# Patient Record
Sex: Female | Born: 1945
Health system: Southern US, Community
[De-identification: ages and names within clinical notes are randomized; demographics above are authoritative.]

## PROBLEM LIST (undated history)

## (undated) DIAGNOSIS — R7301 Impaired fasting glucose: Secondary | ICD-10-CM

## (undated) DIAGNOSIS — K219 Gastro-esophageal reflux disease without esophagitis: Secondary | ICD-10-CM

## (undated) DIAGNOSIS — J342 Deviated nasal septum: Secondary | ICD-10-CM

## (undated) DIAGNOSIS — H269 Unspecified cataract: Secondary | ICD-10-CM

## (undated) DIAGNOSIS — N6019 Diffuse cystic mastopathy of unspecified breast: Secondary | ICD-10-CM

## (undated) DIAGNOSIS — D649 Anemia, unspecified: Secondary | ICD-10-CM

## (undated) DIAGNOSIS — J309 Allergic rhinitis, unspecified: Secondary | ICD-10-CM

## (undated) DIAGNOSIS — T884XXA Failed or difficult intubation, initial encounter: Secondary | ICD-10-CM

## (undated) DIAGNOSIS — IMO0001 Reserved for inherently not codable concepts without codable children: Secondary | ICD-10-CM

## (undated) DIAGNOSIS — T4145XA Adverse effect of unspecified anesthetic, initial encounter: Secondary | ICD-10-CM

## (undated) DIAGNOSIS — E559 Vitamin D deficiency, unspecified: Secondary | ICD-10-CM

## (undated) DIAGNOSIS — T8859XA Other complications of anesthesia, initial encounter: Secondary | ICD-10-CM

## (undated) DIAGNOSIS — M19042 Primary osteoarthritis, left hand: Secondary | ICD-10-CM

## (undated) DIAGNOSIS — M2040 Other hammer toe(s) (acquired), unspecified foot: Secondary | ICD-10-CM

## (undated) DIAGNOSIS — R030 Elevated blood-pressure reading, without diagnosis of hypertension: Secondary | ICD-10-CM

## (undated) DIAGNOSIS — M19041 Primary osteoarthritis, right hand: Secondary | ICD-10-CM

## (undated) DIAGNOSIS — J3089 Other allergic rhinitis: Secondary | ICD-10-CM

## (undated) DIAGNOSIS — N83202 Unspecified ovarian cyst, left side: Secondary | ICD-10-CM

## (undated) DIAGNOSIS — M1990 Unspecified osteoarthritis, unspecified site: Secondary | ICD-10-CM

## (undated) DIAGNOSIS — E785 Hyperlipidemia, unspecified: Secondary | ICD-10-CM

## (undated) DIAGNOSIS — L719 Rosacea, unspecified: Secondary | ICD-10-CM

## (undated) DIAGNOSIS — T7840XA Allergy, unspecified, initial encounter: Secondary | ICD-10-CM

## (undated) DIAGNOSIS — M810 Age-related osteoporosis without current pathological fracture: Secondary | ICD-10-CM

## (undated) DIAGNOSIS — B029 Zoster without complications: Secondary | ICD-10-CM

## (undated) DIAGNOSIS — Z8701 Personal history of pneumonia (recurrent): Secondary | ICD-10-CM

## (undated) DIAGNOSIS — M21619 Bunion of unspecified foot: Secondary | ICD-10-CM

## (undated) DIAGNOSIS — M47812 Spondylosis without myelopathy or radiculopathy, cervical region: Secondary | ICD-10-CM

## (undated) DIAGNOSIS — G4730 Sleep apnea, unspecified: Secondary | ICD-10-CM

## (undated) DIAGNOSIS — L718 Other rosacea: Secondary | ICD-10-CM

## (undated) DIAGNOSIS — E079 Disorder of thyroid, unspecified: Secondary | ICD-10-CM

## (undated) DIAGNOSIS — G4733 Obstructive sleep apnea (adult) (pediatric): Secondary | ICD-10-CM

## (undated) DIAGNOSIS — Z8781 Personal history of (healed) traumatic fracture: Secondary | ICD-10-CM

## (undated) DIAGNOSIS — I10 Essential (primary) hypertension: Secondary | ICD-10-CM

## (undated) DIAGNOSIS — E041 Nontoxic single thyroid nodule: Secondary | ICD-10-CM

## (undated) DIAGNOSIS — N83201 Unspecified ovarian cyst, right side: Secondary | ICD-10-CM

## (undated) HISTORY — PX: SPINE SURGERY: SHX786

## (undated) HISTORY — DX: Other rosacea: L71.8

## (undated) HISTORY — DX: Diffuse cystic mastopathy of unspecified breast: N60.19

## (undated) HISTORY — DX: Primary osteoarthritis, right hand: M19.041

## (undated) HISTORY — DX: Sleep apnea, unspecified: G47.30

## (undated) HISTORY — DX: Unspecified ovarian cyst, left side: N83.202

## (undated) HISTORY — DX: Deviated nasal septum: J34.2

## (undated) HISTORY — DX: Hyperlipidemia, unspecified: E78.5

## (undated) HISTORY — PX: BUNIONECTOMY: SHX129

## (undated) HISTORY — DX: Impaired fasting glucose: R73.01

## (undated) HISTORY — DX: Bunion of unspecified foot: M21.619

## (undated) HISTORY — DX: Allergy, unspecified, initial encounter: T78.40XA

## (undated) HISTORY — DX: Obstructive sleep apnea (adult) (pediatric): G47.33

## (undated) HISTORY — PX: BIOPSY THYROID: PRO38

## (undated) HISTORY — PX: BREAST SURGERY: SHX581

## (undated) HISTORY — DX: Nontoxic single thyroid nodule: E04.1

## (undated) HISTORY — DX: Primary osteoarthritis, right hand: M19.042

## (undated) HISTORY — DX: Unspecified ovarian cyst, right side: N83.201

## (undated) HISTORY — DX: Allergic rhinitis, unspecified: J30.9

## (undated) HISTORY — DX: Vitamin D deficiency, unspecified: E55.9

## (undated) HISTORY — PX: BACK SURGERY: SHX140

## (undated) HISTORY — DX: Unspecified osteoarthritis, unspecified site: M19.90

## (undated) HISTORY — DX: Anemia, unspecified: D64.9

## (undated) HISTORY — DX: Personal history of (healed) traumatic fracture: Z87.81

## (undated) HISTORY — DX: Elevated blood-pressure reading, without diagnosis of hypertension: R03.0

## (undated) HISTORY — DX: Unspecified cataract: H26.9

## (undated) HISTORY — DX: Age-related osteoporosis without current pathological fracture: M81.0

## (undated) HISTORY — DX: Gastro-esophageal reflux disease without esophagitis: K21.9

## (undated) HISTORY — DX: Rosacea, unspecified: L71.9

## (undated) HISTORY — DX: Personal history of pneumonia (recurrent): Z87.01

## (undated) HISTORY — DX: Reserved for inherently not codable concepts without codable children: IMO0001

## (undated) HISTORY — DX: Zoster without complications: B02.9

## (undated) HISTORY — DX: Disorder of thyroid, unspecified: E07.9

## (undated) HISTORY — DX: Other hammer toe(s) (acquired), unspecified foot: M20.40

## (undated) HISTORY — PX: BREAST CYST ASPIRATION: SHX578

## (undated) HISTORY — DX: Essential (primary) hypertension: I10

## (undated) HISTORY — DX: Spondylosis without myelopathy or radiculopathy, cervical region: M47.812

## (undated) HISTORY — PX: EYE SURGERY: SHX253

---

## 2011-02-12 ENCOUNTER — Ambulatory Visit: Payer: Self-pay | Admitting: Family Medicine

## 2011-03-26 ENCOUNTER — Ambulatory Visit: Payer: Self-pay | Admitting: Gastroenterology

## 2011-04-03 ENCOUNTER — Ambulatory Visit: Payer: Self-pay | Admitting: Family Medicine

## 2011-05-08 DIAGNOSIS — J309 Allergic rhinitis, unspecified: Secondary | ICD-10-CM | POA: Diagnosis not present

## 2011-05-08 DIAGNOSIS — H8110 Benign paroxysmal vertigo, unspecified ear: Secondary | ICD-10-CM | POA: Diagnosis not present

## 2011-05-08 DIAGNOSIS — R0602 Shortness of breath: Secondary | ICD-10-CM | POA: Diagnosis not present

## 2011-06-05 DIAGNOSIS — Z01419 Encounter for gynecological examination (general) (routine) without abnormal findings: Secondary | ICD-10-CM | POA: Diagnosis not present

## 2011-06-05 DIAGNOSIS — Z125 Encounter for screening for malignant neoplasm of prostate: Secondary | ICD-10-CM | POA: Diagnosis not present

## 2011-06-05 DIAGNOSIS — H538 Other visual disturbances: Secondary | ICD-10-CM | POA: Diagnosis not present

## 2011-06-05 DIAGNOSIS — K589 Irritable bowel syndrome without diarrhea: Secondary | ICD-10-CM | POA: Diagnosis not present

## 2011-06-05 DIAGNOSIS — E785 Hyperlipidemia, unspecified: Secondary | ICD-10-CM | POA: Diagnosis not present

## 2011-06-05 DIAGNOSIS — Z124 Encounter for screening for malignant neoplasm of cervix: Secondary | ICD-10-CM | POA: Diagnosis not present

## 2011-07-11 DIAGNOSIS — M810 Age-related osteoporosis without current pathological fracture: Secondary | ICD-10-CM | POA: Diagnosis not present

## 2011-08-09 DIAGNOSIS — M810 Age-related osteoporosis without current pathological fracture: Secondary | ICD-10-CM | POA: Diagnosis not present

## 2011-08-20 DIAGNOSIS — B0050 Herpesviral ocular disease, unspecified: Secondary | ICD-10-CM | POA: Diagnosis not present

## 2011-08-20 DIAGNOSIS — H2510 Age-related nuclear cataract, unspecified eye: Secondary | ICD-10-CM | POA: Diagnosis not present

## 2012-05-21 DIAGNOSIS — H00019 Hordeolum externum unspecified eye, unspecified eyelid: Secondary | ICD-10-CM | POA: Diagnosis not present

## 2012-07-29 DIAGNOSIS — Z0000 Encounter for general adult medical examination without abnormal findings: Secondary | ICD-10-CM | POA: Diagnosis not present

## 2012-07-29 DIAGNOSIS — E038 Other specified hypothyroidism: Secondary | ICD-10-CM | POA: Diagnosis not present

## 2012-07-29 DIAGNOSIS — M2040 Other hammer toe(s) (acquired), unspecified foot: Secondary | ICD-10-CM | POA: Diagnosis not present

## 2012-07-29 DIAGNOSIS — R7301 Impaired fasting glucose: Secondary | ICD-10-CM | POA: Diagnosis not present

## 2012-07-29 DIAGNOSIS — G4733 Obstructive sleep apnea (adult) (pediatric): Secondary | ICD-10-CM | POA: Diagnosis not present

## 2012-07-29 DIAGNOSIS — E785 Hyperlipidemia, unspecified: Secondary | ICD-10-CM | POA: Diagnosis not present

## 2012-07-29 DIAGNOSIS — M810 Age-related osteoporosis without current pathological fracture: Secondary | ICD-10-CM | POA: Diagnosis not present

## 2012-08-01 ENCOUNTER — Ambulatory Visit: Payer: Self-pay

## 2012-08-01 DIAGNOSIS — E041 Nontoxic single thyroid nodule: Secondary | ICD-10-CM | POA: Diagnosis not present

## 2012-08-05 DIAGNOSIS — Q6689 Other  specified congenital deformities of feet: Secondary | ICD-10-CM | POA: Diagnosis not present

## 2012-08-05 DIAGNOSIS — M2040 Other hammer toe(s) (acquired), unspecified foot: Secondary | ICD-10-CM | POA: Diagnosis not present

## 2012-08-05 DIAGNOSIS — M2010 Hallux valgus (acquired), unspecified foot: Secondary | ICD-10-CM | POA: Diagnosis not present

## 2012-08-20 DIAGNOSIS — M810 Age-related osteoporosis without current pathological fracture: Secondary | ICD-10-CM | POA: Diagnosis not present

## 2012-08-27 DIAGNOSIS — Z1211 Encounter for screening for malignant neoplasm of colon: Secondary | ICD-10-CM | POA: Diagnosis not present

## 2012-09-03 DIAGNOSIS — H2510 Age-related nuclear cataract, unspecified eye: Secondary | ICD-10-CM | POA: Diagnosis not present

## 2012-09-03 DIAGNOSIS — H179 Unspecified corneal scar and opacity: Secondary | ICD-10-CM | POA: Diagnosis not present

## 2012-09-09 DIAGNOSIS — R509 Fever, unspecified: Secondary | ICD-10-CM | POA: Diagnosis not present

## 2012-09-09 DIAGNOSIS — J020 Streptococcal pharyngitis: Secondary | ICD-10-CM | POA: Diagnosis not present

## 2012-11-05 DIAGNOSIS — Z1211 Encounter for screening for malignant neoplasm of colon: Secondary | ICD-10-CM | POA: Diagnosis not present

## 2012-11-24 DIAGNOSIS — L578 Other skin changes due to chronic exposure to nonionizing radiation: Secondary | ICD-10-CM | POA: Diagnosis not present

## 2012-11-24 DIAGNOSIS — D235 Other benign neoplasm of skin of trunk: Secondary | ICD-10-CM | POA: Diagnosis not present

## 2012-12-01 DIAGNOSIS — E538 Deficiency of other specified B group vitamins: Secondary | ICD-10-CM | POA: Diagnosis not present

## 2012-12-01 DIAGNOSIS — E559 Vitamin D deficiency, unspecified: Secondary | ICD-10-CM | POA: Diagnosis not present

## 2012-12-01 DIAGNOSIS — E539 Vitamin B deficiency, unspecified: Secondary | ICD-10-CM | POA: Diagnosis not present

## 2012-12-01 DIAGNOSIS — E038 Other specified hypothyroidism: Secondary | ICD-10-CM | POA: Diagnosis not present

## 2012-12-29 DIAGNOSIS — R5381 Other malaise: Secondary | ICD-10-CM | POA: Diagnosis not present

## 2013-01-20 DIAGNOSIS — Z23 Encounter for immunization: Secondary | ICD-10-CM | POA: Diagnosis not present

## 2013-07-18 DIAGNOSIS — L259 Unspecified contact dermatitis, unspecified cause: Secondary | ICD-10-CM | POA: Diagnosis not present

## 2013-07-18 DIAGNOSIS — R21 Rash and other nonspecific skin eruption: Secondary | ICD-10-CM | POA: Diagnosis not present

## 2013-07-24 DIAGNOSIS — E039 Hypothyroidism, unspecified: Secondary | ICD-10-CM | POA: Diagnosis not present

## 2013-08-18 DIAGNOSIS — M810 Age-related osteoporosis without current pathological fracture: Secondary | ICD-10-CM | POA: Diagnosis not present

## 2013-08-21 DIAGNOSIS — F32A Depression, unspecified: Secondary | ICD-10-CM | POA: Insufficient documentation

## 2013-08-21 DIAGNOSIS — F329 Major depressive disorder, single episode, unspecified: Secondary | ICD-10-CM | POA: Insufficient documentation

## 2013-08-21 DIAGNOSIS — M810 Age-related osteoporosis without current pathological fracture: Secondary | ICD-10-CM | POA: Insufficient documentation

## 2013-08-21 DIAGNOSIS — D649 Anemia, unspecified: Secondary | ICD-10-CM | POA: Insufficient documentation

## 2013-08-21 DIAGNOSIS — M1990 Unspecified osteoarthritis, unspecified site: Secondary | ICD-10-CM | POA: Insufficient documentation

## 2013-08-27 DIAGNOSIS — M810 Age-related osteoporosis without current pathological fracture: Secondary | ICD-10-CM | POA: Diagnosis not present

## 2013-09-03 DIAGNOSIS — E041 Nontoxic single thyroid nodule: Secondary | ICD-10-CM | POA: Diagnosis not present

## 2013-09-03 DIAGNOSIS — E039 Hypothyroidism, unspecified: Secondary | ICD-10-CM | POA: Diagnosis not present

## 2013-09-09 DIAGNOSIS — M5137 Other intervertebral disc degeneration, lumbosacral region: Secondary | ICD-10-CM | POA: Diagnosis not present

## 2013-09-09 DIAGNOSIS — M545 Low back pain, unspecified: Secondary | ICD-10-CM | POA: Diagnosis not present

## 2013-09-09 DIAGNOSIS — M5030 Other cervical disc degeneration, unspecified cervical region: Secondary | ICD-10-CM | POA: Diagnosis not present

## 2013-09-09 DIAGNOSIS — M9981 Other biomechanical lesions of cervical region: Secondary | ICD-10-CM | POA: Diagnosis not present

## 2013-09-09 DIAGNOSIS — M542 Cervicalgia: Secondary | ICD-10-CM | POA: Diagnosis not present

## 2013-09-09 DIAGNOSIS — M999 Biomechanical lesion, unspecified: Secondary | ICD-10-CM | POA: Diagnosis not present

## 2013-09-09 DIAGNOSIS — M546 Pain in thoracic spine: Secondary | ICD-10-CM | POA: Diagnosis not present

## 2013-09-09 DIAGNOSIS — H2510 Age-related nuclear cataract, unspecified eye: Secondary | ICD-10-CM | POA: Diagnosis not present

## 2013-09-09 DIAGNOSIS — H018 Other specified inflammations of eyelid: Secondary | ICD-10-CM | POA: Diagnosis not present

## 2013-09-11 DIAGNOSIS — M5137 Other intervertebral disc degeneration, lumbosacral region: Secondary | ICD-10-CM | POA: Diagnosis not present

## 2013-09-11 DIAGNOSIS — M545 Low back pain, unspecified: Secondary | ICD-10-CM | POA: Diagnosis not present

## 2013-09-11 DIAGNOSIS — M5030 Other cervical disc degeneration, unspecified cervical region: Secondary | ICD-10-CM | POA: Diagnosis not present

## 2013-09-11 DIAGNOSIS — M542 Cervicalgia: Secondary | ICD-10-CM | POA: Diagnosis not present

## 2013-09-11 DIAGNOSIS — M546 Pain in thoracic spine: Secondary | ICD-10-CM | POA: Diagnosis not present

## 2013-09-11 DIAGNOSIS — M9981 Other biomechanical lesions of cervical region: Secondary | ICD-10-CM | POA: Diagnosis not present

## 2013-09-11 DIAGNOSIS — M999 Biomechanical lesion, unspecified: Secondary | ICD-10-CM | POA: Diagnosis not present

## 2013-09-14 DIAGNOSIS — M546 Pain in thoracic spine: Secondary | ICD-10-CM | POA: Diagnosis not present

## 2013-09-14 DIAGNOSIS — M999 Biomechanical lesion, unspecified: Secondary | ICD-10-CM | POA: Diagnosis not present

## 2013-09-14 DIAGNOSIS — M9981 Other biomechanical lesions of cervical region: Secondary | ICD-10-CM | POA: Diagnosis not present

## 2013-09-14 DIAGNOSIS — M545 Low back pain, unspecified: Secondary | ICD-10-CM | POA: Diagnosis not present

## 2013-09-14 DIAGNOSIS — M5030 Other cervical disc degeneration, unspecified cervical region: Secondary | ICD-10-CM | POA: Diagnosis not present

## 2013-09-14 DIAGNOSIS — M5137 Other intervertebral disc degeneration, lumbosacral region: Secondary | ICD-10-CM | POA: Diagnosis not present

## 2013-09-14 DIAGNOSIS — M542 Cervicalgia: Secondary | ICD-10-CM | POA: Diagnosis not present

## 2013-09-16 DIAGNOSIS — E041 Nontoxic single thyroid nodule: Secondary | ICD-10-CM | POA: Diagnosis not present

## 2013-09-18 DIAGNOSIS — M9981 Other biomechanical lesions of cervical region: Secondary | ICD-10-CM | POA: Diagnosis not present

## 2013-09-18 DIAGNOSIS — M546 Pain in thoracic spine: Secondary | ICD-10-CM | POA: Diagnosis not present

## 2013-09-18 DIAGNOSIS — M545 Low back pain, unspecified: Secondary | ICD-10-CM | POA: Diagnosis not present

## 2013-09-18 DIAGNOSIS — M999 Biomechanical lesion, unspecified: Secondary | ICD-10-CM | POA: Diagnosis not present

## 2013-09-18 DIAGNOSIS — M5030 Other cervical disc degeneration, unspecified cervical region: Secondary | ICD-10-CM | POA: Diagnosis not present

## 2013-09-18 DIAGNOSIS — M5137 Other intervertebral disc degeneration, lumbosacral region: Secondary | ICD-10-CM | POA: Diagnosis not present

## 2013-09-18 DIAGNOSIS — M542 Cervicalgia: Secondary | ICD-10-CM | POA: Diagnosis not present

## 2013-09-21 DIAGNOSIS — M5137 Other intervertebral disc degeneration, lumbosacral region: Secondary | ICD-10-CM | POA: Diagnosis not present

## 2013-09-21 DIAGNOSIS — M542 Cervicalgia: Secondary | ICD-10-CM | POA: Diagnosis not present

## 2013-09-21 DIAGNOSIS — M545 Low back pain, unspecified: Secondary | ICD-10-CM | POA: Diagnosis not present

## 2013-09-21 DIAGNOSIS — M546 Pain in thoracic spine: Secondary | ICD-10-CM | POA: Diagnosis not present

## 2013-09-21 DIAGNOSIS — M9981 Other biomechanical lesions of cervical region: Secondary | ICD-10-CM | POA: Diagnosis not present

## 2013-09-21 DIAGNOSIS — M5030 Other cervical disc degeneration, unspecified cervical region: Secondary | ICD-10-CM | POA: Diagnosis not present

## 2013-09-21 DIAGNOSIS — M999 Biomechanical lesion, unspecified: Secondary | ICD-10-CM | POA: Diagnosis not present

## 2013-09-25 DIAGNOSIS — M545 Low back pain, unspecified: Secondary | ICD-10-CM | POA: Diagnosis not present

## 2013-09-25 DIAGNOSIS — M9981 Other biomechanical lesions of cervical region: Secondary | ICD-10-CM | POA: Diagnosis not present

## 2013-09-25 DIAGNOSIS — M5137 Other intervertebral disc degeneration, lumbosacral region: Secondary | ICD-10-CM | POA: Diagnosis not present

## 2013-09-25 DIAGNOSIS — M999 Biomechanical lesion, unspecified: Secondary | ICD-10-CM | POA: Diagnosis not present

## 2013-09-25 DIAGNOSIS — M5030 Other cervical disc degeneration, unspecified cervical region: Secondary | ICD-10-CM | POA: Diagnosis not present

## 2013-09-25 DIAGNOSIS — M546 Pain in thoracic spine: Secondary | ICD-10-CM | POA: Diagnosis not present

## 2013-09-25 DIAGNOSIS — M542 Cervicalgia: Secondary | ICD-10-CM | POA: Diagnosis not present

## 2013-09-28 DIAGNOSIS — M545 Low back pain, unspecified: Secondary | ICD-10-CM | POA: Diagnosis not present

## 2013-09-28 DIAGNOSIS — M9981 Other biomechanical lesions of cervical region: Secondary | ICD-10-CM | POA: Diagnosis not present

## 2013-09-28 DIAGNOSIS — M999 Biomechanical lesion, unspecified: Secondary | ICD-10-CM | POA: Diagnosis not present

## 2013-09-28 DIAGNOSIS — M542 Cervicalgia: Secondary | ICD-10-CM | POA: Diagnosis not present

## 2013-09-28 DIAGNOSIS — M546 Pain in thoracic spine: Secondary | ICD-10-CM | POA: Diagnosis not present

## 2013-09-28 DIAGNOSIS — M5137 Other intervertebral disc degeneration, lumbosacral region: Secondary | ICD-10-CM | POA: Diagnosis not present

## 2013-09-28 DIAGNOSIS — M5030 Other cervical disc degeneration, unspecified cervical region: Secondary | ICD-10-CM | POA: Diagnosis not present

## 2013-09-30 DIAGNOSIS — M999 Biomechanical lesion, unspecified: Secondary | ICD-10-CM | POA: Diagnosis not present

## 2013-09-30 DIAGNOSIS — M5137 Other intervertebral disc degeneration, lumbosacral region: Secondary | ICD-10-CM | POA: Diagnosis not present

## 2013-09-30 DIAGNOSIS — M546 Pain in thoracic spine: Secondary | ICD-10-CM | POA: Diagnosis not present

## 2013-09-30 DIAGNOSIS — M542 Cervicalgia: Secondary | ICD-10-CM | POA: Diagnosis not present

## 2013-09-30 DIAGNOSIS — M545 Low back pain, unspecified: Secondary | ICD-10-CM | POA: Diagnosis not present

## 2013-09-30 DIAGNOSIS — M5030 Other cervical disc degeneration, unspecified cervical region: Secondary | ICD-10-CM | POA: Diagnosis not present

## 2013-09-30 DIAGNOSIS — M9981 Other biomechanical lesions of cervical region: Secondary | ICD-10-CM | POA: Diagnosis not present

## 2013-10-02 DIAGNOSIS — M5137 Other intervertebral disc degeneration, lumbosacral region: Secondary | ICD-10-CM | POA: Diagnosis not present

## 2013-10-02 DIAGNOSIS — M5030 Other cervical disc degeneration, unspecified cervical region: Secondary | ICD-10-CM | POA: Diagnosis not present

## 2013-10-02 DIAGNOSIS — M999 Biomechanical lesion, unspecified: Secondary | ICD-10-CM | POA: Diagnosis not present

## 2013-10-02 DIAGNOSIS — M545 Low back pain, unspecified: Secondary | ICD-10-CM | POA: Diagnosis not present

## 2013-10-02 DIAGNOSIS — M546 Pain in thoracic spine: Secondary | ICD-10-CM | POA: Diagnosis not present

## 2013-10-02 DIAGNOSIS — M9981 Other biomechanical lesions of cervical region: Secondary | ICD-10-CM | POA: Diagnosis not present

## 2013-10-02 DIAGNOSIS — M542 Cervicalgia: Secondary | ICD-10-CM | POA: Diagnosis not present

## 2013-10-05 DIAGNOSIS — M5137 Other intervertebral disc degeneration, lumbosacral region: Secondary | ICD-10-CM | POA: Diagnosis not present

## 2013-10-05 DIAGNOSIS — M542 Cervicalgia: Secondary | ICD-10-CM | POA: Diagnosis not present

## 2013-10-05 DIAGNOSIS — M999 Biomechanical lesion, unspecified: Secondary | ICD-10-CM | POA: Diagnosis not present

## 2013-10-05 DIAGNOSIS — M5030 Other cervical disc degeneration, unspecified cervical region: Secondary | ICD-10-CM | POA: Diagnosis not present

## 2013-10-05 DIAGNOSIS — M546 Pain in thoracic spine: Secondary | ICD-10-CM | POA: Diagnosis not present

## 2013-10-05 DIAGNOSIS — M545 Low back pain, unspecified: Secondary | ICD-10-CM | POA: Diagnosis not present

## 2013-10-05 DIAGNOSIS — M9981 Other biomechanical lesions of cervical region: Secondary | ICD-10-CM | POA: Diagnosis not present

## 2013-10-07 DIAGNOSIS — M546 Pain in thoracic spine: Secondary | ICD-10-CM | POA: Diagnosis not present

## 2013-10-07 DIAGNOSIS — M545 Low back pain, unspecified: Secondary | ICD-10-CM | POA: Diagnosis not present

## 2013-10-07 DIAGNOSIS — M5137 Other intervertebral disc degeneration, lumbosacral region: Secondary | ICD-10-CM | POA: Diagnosis not present

## 2013-10-07 DIAGNOSIS — M999 Biomechanical lesion, unspecified: Secondary | ICD-10-CM | POA: Diagnosis not present

## 2013-10-07 DIAGNOSIS — M9981 Other biomechanical lesions of cervical region: Secondary | ICD-10-CM | POA: Diagnosis not present

## 2013-10-07 DIAGNOSIS — M542 Cervicalgia: Secondary | ICD-10-CM | POA: Diagnosis not present

## 2013-10-07 DIAGNOSIS — M5030 Other cervical disc degeneration, unspecified cervical region: Secondary | ICD-10-CM | POA: Diagnosis not present

## 2013-10-12 DIAGNOSIS — M545 Low back pain, unspecified: Secondary | ICD-10-CM | POA: Diagnosis not present

## 2013-10-12 DIAGNOSIS — M5030 Other cervical disc degeneration, unspecified cervical region: Secondary | ICD-10-CM | POA: Diagnosis not present

## 2013-10-12 DIAGNOSIS — M546 Pain in thoracic spine: Secondary | ICD-10-CM | POA: Diagnosis not present

## 2013-10-12 DIAGNOSIS — M542 Cervicalgia: Secondary | ICD-10-CM | POA: Diagnosis not present

## 2013-10-12 DIAGNOSIS — M5137 Other intervertebral disc degeneration, lumbosacral region: Secondary | ICD-10-CM | POA: Diagnosis not present

## 2013-10-12 DIAGNOSIS — M999 Biomechanical lesion, unspecified: Secondary | ICD-10-CM | POA: Diagnosis not present

## 2013-10-12 DIAGNOSIS — M9981 Other biomechanical lesions of cervical region: Secondary | ICD-10-CM | POA: Diagnosis not present

## 2013-10-14 DIAGNOSIS — M5030 Other cervical disc degeneration, unspecified cervical region: Secondary | ICD-10-CM | POA: Diagnosis not present

## 2013-10-14 DIAGNOSIS — M999 Biomechanical lesion, unspecified: Secondary | ICD-10-CM | POA: Diagnosis not present

## 2013-10-14 DIAGNOSIS — M9981 Other biomechanical lesions of cervical region: Secondary | ICD-10-CM | POA: Diagnosis not present

## 2013-10-14 DIAGNOSIS — M5137 Other intervertebral disc degeneration, lumbosacral region: Secondary | ICD-10-CM | POA: Diagnosis not present

## 2013-10-14 DIAGNOSIS — M542 Cervicalgia: Secondary | ICD-10-CM | POA: Diagnosis not present

## 2013-10-14 DIAGNOSIS — M545 Low back pain, unspecified: Secondary | ICD-10-CM | POA: Diagnosis not present

## 2013-10-14 DIAGNOSIS — M546 Pain in thoracic spine: Secondary | ICD-10-CM | POA: Diagnosis not present

## 2013-10-16 DIAGNOSIS — M5137 Other intervertebral disc degeneration, lumbosacral region: Secondary | ICD-10-CM | POA: Diagnosis not present

## 2013-10-16 DIAGNOSIS — M999 Biomechanical lesion, unspecified: Secondary | ICD-10-CM | POA: Diagnosis not present

## 2013-10-16 DIAGNOSIS — M5030 Other cervical disc degeneration, unspecified cervical region: Secondary | ICD-10-CM | POA: Diagnosis not present

## 2013-10-16 DIAGNOSIS — M545 Low back pain, unspecified: Secondary | ICD-10-CM | POA: Diagnosis not present

## 2013-10-16 DIAGNOSIS — M542 Cervicalgia: Secondary | ICD-10-CM | POA: Diagnosis not present

## 2013-10-16 DIAGNOSIS — M546 Pain in thoracic spine: Secondary | ICD-10-CM | POA: Diagnosis not present

## 2013-10-16 DIAGNOSIS — M9981 Other biomechanical lesions of cervical region: Secondary | ICD-10-CM | POA: Diagnosis not present

## 2013-10-26 DIAGNOSIS — M999 Biomechanical lesion, unspecified: Secondary | ICD-10-CM | POA: Diagnosis not present

## 2013-10-26 DIAGNOSIS — M5030 Other cervical disc degeneration, unspecified cervical region: Secondary | ICD-10-CM | POA: Diagnosis not present

## 2013-10-26 DIAGNOSIS — M5137 Other intervertebral disc degeneration, lumbosacral region: Secondary | ICD-10-CM | POA: Diagnosis not present

## 2013-10-26 DIAGNOSIS — M545 Low back pain, unspecified: Secondary | ICD-10-CM | POA: Diagnosis not present

## 2013-10-26 DIAGNOSIS — M9981 Other biomechanical lesions of cervical region: Secondary | ICD-10-CM | POA: Diagnosis not present

## 2013-10-26 DIAGNOSIS — M546 Pain in thoracic spine: Secondary | ICD-10-CM | POA: Diagnosis not present

## 2013-10-26 DIAGNOSIS — M542 Cervicalgia: Secondary | ICD-10-CM | POA: Diagnosis not present

## 2013-10-28 DIAGNOSIS — M545 Low back pain, unspecified: Secondary | ICD-10-CM | POA: Diagnosis not present

## 2013-10-28 DIAGNOSIS — M999 Biomechanical lesion, unspecified: Secondary | ICD-10-CM | POA: Diagnosis not present

## 2013-10-28 DIAGNOSIS — M5137 Other intervertebral disc degeneration, lumbosacral region: Secondary | ICD-10-CM | POA: Diagnosis not present

## 2013-10-28 DIAGNOSIS — M5030 Other cervical disc degeneration, unspecified cervical region: Secondary | ICD-10-CM | POA: Diagnosis not present

## 2013-10-28 DIAGNOSIS — M546 Pain in thoracic spine: Secondary | ICD-10-CM | POA: Diagnosis not present

## 2013-10-28 DIAGNOSIS — M9981 Other biomechanical lesions of cervical region: Secondary | ICD-10-CM | POA: Diagnosis not present

## 2013-10-28 DIAGNOSIS — M542 Cervicalgia: Secondary | ICD-10-CM | POA: Diagnosis not present

## 2013-10-30 DIAGNOSIS — M5137 Other intervertebral disc degeneration, lumbosacral region: Secondary | ICD-10-CM | POA: Diagnosis not present

## 2013-10-30 DIAGNOSIS — M545 Low back pain, unspecified: Secondary | ICD-10-CM | POA: Diagnosis not present

## 2013-10-30 DIAGNOSIS — M5030 Other cervical disc degeneration, unspecified cervical region: Secondary | ICD-10-CM | POA: Diagnosis not present

## 2013-10-30 DIAGNOSIS — M546 Pain in thoracic spine: Secondary | ICD-10-CM | POA: Diagnosis not present

## 2013-10-30 DIAGNOSIS — M9981 Other biomechanical lesions of cervical region: Secondary | ICD-10-CM | POA: Diagnosis not present

## 2013-10-30 DIAGNOSIS — M542 Cervicalgia: Secondary | ICD-10-CM | POA: Diagnosis not present

## 2013-10-30 DIAGNOSIS — M999 Biomechanical lesion, unspecified: Secondary | ICD-10-CM | POA: Diagnosis not present

## 2013-11-02 DIAGNOSIS — M5030 Other cervical disc degeneration, unspecified cervical region: Secondary | ICD-10-CM | POA: Diagnosis not present

## 2013-11-02 DIAGNOSIS — M5137 Other intervertebral disc degeneration, lumbosacral region: Secondary | ICD-10-CM | POA: Diagnosis not present

## 2013-11-02 DIAGNOSIS — M545 Low back pain, unspecified: Secondary | ICD-10-CM | POA: Diagnosis not present

## 2013-11-02 DIAGNOSIS — Z0000 Encounter for general adult medical examination without abnormal findings: Secondary | ICD-10-CM | POA: Diagnosis not present

## 2013-11-02 DIAGNOSIS — M9981 Other biomechanical lesions of cervical region: Secondary | ICD-10-CM | POA: Diagnosis not present

## 2013-11-02 DIAGNOSIS — E785 Hyperlipidemia, unspecified: Secondary | ICD-10-CM | POA: Diagnosis not present

## 2013-11-02 DIAGNOSIS — M542 Cervicalgia: Secondary | ICD-10-CM | POA: Diagnosis not present

## 2013-11-02 DIAGNOSIS — E559 Vitamin D deficiency, unspecified: Secondary | ICD-10-CM | POA: Diagnosis not present

## 2013-11-02 DIAGNOSIS — M546 Pain in thoracic spine: Secondary | ICD-10-CM | POA: Diagnosis not present

## 2013-11-02 DIAGNOSIS — M999 Biomechanical lesion, unspecified: Secondary | ICD-10-CM | POA: Diagnosis not present

## 2013-11-02 DIAGNOSIS — Z23 Encounter for immunization: Secondary | ICD-10-CM | POA: Diagnosis not present

## 2013-11-02 DIAGNOSIS — E538 Deficiency of other specified B group vitamins: Secondary | ICD-10-CM | POA: Diagnosis not present

## 2013-11-04 DIAGNOSIS — M545 Low back pain, unspecified: Secondary | ICD-10-CM | POA: Diagnosis not present

## 2013-11-04 DIAGNOSIS — M542 Cervicalgia: Secondary | ICD-10-CM | POA: Diagnosis not present

## 2013-11-04 DIAGNOSIS — M9981 Other biomechanical lesions of cervical region: Secondary | ICD-10-CM | POA: Diagnosis not present

## 2013-11-04 DIAGNOSIS — M999 Biomechanical lesion, unspecified: Secondary | ICD-10-CM | POA: Diagnosis not present

## 2013-11-04 DIAGNOSIS — M5137 Other intervertebral disc degeneration, lumbosacral region: Secondary | ICD-10-CM | POA: Diagnosis not present

## 2013-11-04 DIAGNOSIS — M5030 Other cervical disc degeneration, unspecified cervical region: Secondary | ICD-10-CM | POA: Diagnosis not present

## 2013-11-04 DIAGNOSIS — M546 Pain in thoracic spine: Secondary | ICD-10-CM | POA: Diagnosis not present

## 2013-11-05 DIAGNOSIS — K219 Gastro-esophageal reflux disease without esophagitis: Secondary | ICD-10-CM | POA: Diagnosis not present

## 2013-11-05 DIAGNOSIS — G4730 Sleep apnea, unspecified: Secondary | ICD-10-CM | POA: Diagnosis not present

## 2013-11-05 DIAGNOSIS — I10 Essential (primary) hypertension: Secondary | ICD-10-CM | POA: Diagnosis not present

## 2013-11-05 DIAGNOSIS — E785 Hyperlipidemia, unspecified: Secondary | ICD-10-CM | POA: Diagnosis not present

## 2013-11-05 DIAGNOSIS — R0602 Shortness of breath: Secondary | ICD-10-CM | POA: Diagnosis not present

## 2013-11-11 DIAGNOSIS — M5030 Other cervical disc degeneration, unspecified cervical region: Secondary | ICD-10-CM | POA: Diagnosis not present

## 2013-11-11 DIAGNOSIS — M542 Cervicalgia: Secondary | ICD-10-CM | POA: Diagnosis not present

## 2013-11-11 DIAGNOSIS — M9981 Other biomechanical lesions of cervical region: Secondary | ICD-10-CM | POA: Diagnosis not present

## 2013-11-11 DIAGNOSIS — M546 Pain in thoracic spine: Secondary | ICD-10-CM | POA: Diagnosis not present

## 2013-11-11 DIAGNOSIS — M5137 Other intervertebral disc degeneration, lumbosacral region: Secondary | ICD-10-CM | POA: Diagnosis not present

## 2013-11-11 DIAGNOSIS — M545 Low back pain, unspecified: Secondary | ICD-10-CM | POA: Diagnosis not present

## 2013-11-11 DIAGNOSIS — M999 Biomechanical lesion, unspecified: Secondary | ICD-10-CM | POA: Diagnosis not present

## 2013-11-13 DIAGNOSIS — I8310 Varicose veins of unspecified lower extremity with inflammation: Secondary | ICD-10-CM | POA: Diagnosis not present

## 2014-01-26 DIAGNOSIS — L818 Other specified disorders of pigmentation: Secondary | ICD-10-CM | POA: Diagnosis not present

## 2014-01-26 DIAGNOSIS — D485 Neoplasm of uncertain behavior of skin: Secondary | ICD-10-CM | POA: Diagnosis not present

## 2014-01-26 DIAGNOSIS — L578 Other skin changes due to chronic exposure to nonionizing radiation: Secondary | ICD-10-CM | POA: Diagnosis not present

## 2014-02-08 DIAGNOSIS — B009 Herpesviral infection, unspecified: Secondary | ICD-10-CM | POA: Diagnosis not present

## 2014-02-09 DIAGNOSIS — L209 Atopic dermatitis, unspecified: Secondary | ICD-10-CM | POA: Diagnosis not present

## 2014-02-12 DIAGNOSIS — I788 Other diseases of capillaries: Secondary | ICD-10-CM | POA: Diagnosis not present

## 2014-02-17 DIAGNOSIS — H1012 Acute atopic conjunctivitis, left eye: Secondary | ICD-10-CM | POA: Diagnosis not present

## 2014-02-17 DIAGNOSIS — L259 Unspecified contact dermatitis, unspecified cause: Secondary | ICD-10-CM | POA: Diagnosis not present

## 2014-02-22 DIAGNOSIS — H1013 Acute atopic conjunctivitis, bilateral: Secondary | ICD-10-CM | POA: Diagnosis not present

## 2014-02-24 DIAGNOSIS — H1012 Acute atopic conjunctivitis, left eye: Secondary | ICD-10-CM | POA: Diagnosis not present

## 2014-02-24 DIAGNOSIS — H1033 Unspecified acute conjunctivitis, bilateral: Secondary | ICD-10-CM | POA: Diagnosis not present

## 2014-02-25 DIAGNOSIS — L259 Unspecified contact dermatitis, unspecified cause: Secondary | ICD-10-CM | POA: Diagnosis not present

## 2014-02-25 DIAGNOSIS — H1012 Acute atopic conjunctivitis, left eye: Secondary | ICD-10-CM | POA: Diagnosis not present

## 2014-03-02 DIAGNOSIS — H16203 Unspecified keratoconjunctivitis, bilateral: Secondary | ICD-10-CM | POA: Diagnosis not present

## 2014-03-10 DIAGNOSIS — H1033 Unspecified acute conjunctivitis, bilateral: Secondary | ICD-10-CM | POA: Diagnosis not present

## 2014-03-16 DIAGNOSIS — J342 Deviated nasal septum: Secondary | ICD-10-CM | POA: Diagnosis not present

## 2014-03-16 DIAGNOSIS — J301 Allergic rhinitis due to pollen: Secondary | ICD-10-CM | POA: Diagnosis not present

## 2014-03-16 DIAGNOSIS — E041 Nontoxic single thyroid nodule: Secondary | ICD-10-CM | POA: Diagnosis not present

## 2014-03-17 DIAGNOSIS — H01009 Unspecified blepharitis unspecified eye, unspecified eyelid: Secondary | ICD-10-CM | POA: Diagnosis not present

## 2014-03-24 DIAGNOSIS — L719 Rosacea, unspecified: Secondary | ICD-10-CM | POA: Diagnosis not present

## 2014-04-01 DIAGNOSIS — L218 Other seborrheic dermatitis: Secondary | ICD-10-CM | POA: Diagnosis not present

## 2014-04-01 DIAGNOSIS — L718 Other rosacea: Secondary | ICD-10-CM | POA: Diagnosis not present

## 2014-04-07 DIAGNOSIS — L719 Rosacea, unspecified: Secondary | ICD-10-CM | POA: Diagnosis not present

## 2014-05-05 DIAGNOSIS — I10 Essential (primary) hypertension: Secondary | ICD-10-CM | POA: Diagnosis not present

## 2014-05-05 DIAGNOSIS — E782 Mixed hyperlipidemia: Secondary | ICD-10-CM | POA: Diagnosis not present

## 2014-05-06 DIAGNOSIS — L218 Other seborrheic dermatitis: Secondary | ICD-10-CM | POA: Diagnosis not present

## 2014-05-06 DIAGNOSIS — L719 Rosacea, unspecified: Secondary | ICD-10-CM | POA: Diagnosis not present

## 2014-05-06 DIAGNOSIS — L718 Other rosacea: Secondary | ICD-10-CM | POA: Diagnosis not present

## 2014-07-22 DIAGNOSIS — L718 Other rosacea: Secondary | ICD-10-CM | POA: Diagnosis not present

## 2014-07-22 DIAGNOSIS — L218 Other seborrheic dermatitis: Secondary | ICD-10-CM | POA: Diagnosis not present

## 2014-07-27 DIAGNOSIS — M810 Age-related osteoporosis without current pathological fracture: Secondary | ICD-10-CM | POA: Diagnosis not present

## 2014-07-27 DIAGNOSIS — R7301 Impaired fasting glucose: Secondary | ICD-10-CM | POA: Diagnosis not present

## 2014-07-27 DIAGNOSIS — E039 Hypothyroidism, unspecified: Secondary | ICD-10-CM | POA: Diagnosis not present

## 2014-08-03 ENCOUNTER — Encounter: Payer: Self-pay | Admitting: Endocrinology

## 2014-08-03 ENCOUNTER — Encounter (INDEPENDENT_AMBULATORY_CARE_PROVIDER_SITE_OTHER): Payer: Self-pay

## 2014-08-03 ENCOUNTER — Ambulatory Visit (INDEPENDENT_AMBULATORY_CARE_PROVIDER_SITE_OTHER): Payer: Medicare Other | Admitting: Endocrinology

## 2014-08-03 VITALS — BP 112/64 | HR 78 | Resp 14 | Ht <= 58 in | Wt 109.2 lb

## 2014-08-03 DIAGNOSIS — E559 Vitamin D deficiency, unspecified: Secondary | ICD-10-CM | POA: Insufficient documentation

## 2014-08-03 DIAGNOSIS — E041 Nontoxic single thyroid nodule: Secondary | ICD-10-CM | POA: Insufficient documentation

## 2014-08-03 DIAGNOSIS — M8580 Other specified disorders of bone density and structure, unspecified site: Secondary | ICD-10-CM | POA: Insufficient documentation

## 2014-08-03 DIAGNOSIS — E039 Hypothyroidism, unspecified: Secondary | ICD-10-CM

## 2014-08-03 DIAGNOSIS — R7301 Impaired fasting glucose: Secondary | ICD-10-CM | POA: Insufficient documentation

## 2014-08-03 LAB — TSH: TSH: 1.31 u[IU]/mL (ref 0.35–4.50)

## 2014-08-03 LAB — T4, FREE: Free T4: 1.07 ng/dL (ref 0.60–1.60)

## 2014-08-03 NOTE — Assessment & Plan Note (Signed)
Clinically euthyroid today.  Explained that her thyroid requirements might go down with advancing age.  Goal TSH should be between 1-2.5 for her. Continue current levothyroxine for now and adjust dose based on labs.

## 2014-08-03 NOTE — Progress Notes (Signed)
Pre visit review using our clinic review tool, if applicable. No additional management support is needed unless otherwise documented below in the visit note. 

## 2014-08-03 NOTE — Progress Notes (Signed)
Reason for visit: Hypothyroidism  HPI  Angela Kelly is a 69 y.o.-year-old female, referred by her PCP, LADA, MELINDA P, MD,  for evaluation for hypothyroidism and thyroid nodule. Seen by Dr Gabriel Carina at [facility] may 2015 ( notes reviewed on Care Everywhere) Patient moved from [place] in 2012 and was followed by endocrine there previously.   Patient recalls being diagnosed with hypothyroidism around her age 76s following blood work. Most recently, she has been on Levothyroxine 75 mcg daily. Patient has been taking this medication fasting, with water, separate from breakfast > 30 minutes and from pills ( calcium, iron, PPIs, multivitamins)> 4 hours. Denies missing any doses recently. No recent dose adjustment done recently.   Denies any recent use of iodine supplements or herbal medications.   I reviewed pt's thyroid tests: No results found for: TSH, FREET4, T3FREE  Reports that they haven't been checked recently.   Review of systems: [ x  ] complains of    [  ] denies [  ] weight gain  [ x ] constipation-chronic and unchanged  [ x ] fatigue-  Chronic and unchanged [  ] dry skin  [  ] cold intolerance [ x ] hair loss-chronic [  ] noticing any enlargement in size of thyroid [ x ] lumps in neck [x  ] dysphagia-occ, to certain foods [  ] change in voice [  ]  SOB with lying down. Lost 8 lbs since Sept 2015 after moving over to gluten free diet  she has family history of  thyroid disorders in: mother ( not entirely sure). No Family history of thyroid cancer.  No history of XRT to head or neck.   She also has a left thyroid nodule, noted atleast since 2010, that is being monitored with serial [place] and per review of Dr Joycie Peek notes has remained stable over the past several years. It is sub cm without any concerning features or enlarged cervical LAD. Per review of Dr Joycie Peek notes, prior FNA 2011 was c/w benign follicular nodule. Serial ultrasounds  are summarized below ( obtained from Dr Gabriel Carina  Notes): 01/06/09 - neck US showed a left sided 7.7 mm thyroid nodule 07/28/09 - neck US showed a left-sided 8.0 mm thyroid nodule 09/05/09 - US guided DNA of a left lobe nodule was benign and c/w a benign follicular nodule 40/08/67 - neck US showed a 9.1 mm thyroid nodule 08/26/10 - neck US showed a 8.7 mm thyroid nodule 08/01/12 - neck US showed a 7.5 mm thyroid nodule  US soft tissue head and neck - Final result (09/16/2013 9:33 AM) US soft tissue head and neck - Final result (09/16/2013 9:33 AM)  Narrative  Indication: Thyroid nodules  Comparison: None  Technique: Gray-scale and color Doppler images of the neck were obtained.    Findings:  THYROID:  The right lobe of the thyroid measures 3.30 x 1.20 x 1.14 cm.  The left lobe of the thyroid measures 2.44 x 0.42 x 1.17 cm.  The isthmus measures 0.22 cm in AP depth.    Echotexture of the thyroid is homogeneous.    Within the right lobe, there are no nodules present.  Within the mid portion of the left lobe is a 0.86 x 0.35 x 0.51 cm   hypoechoic nodule.   There are no significantly enlarged lymph nodes in the neck.    Impression:  Homogeneous thyroid gland which is not enlarged.    There is a solitary left-sided 0.8 cm thyroid nodule.  I reviewed her chart and she also has a history of Ocular Rosacea that was recently diagnosed and is being treated by opthalmology and dermatology with doxycycline. Notes a swelling in upper left neck- that had increased in size around the time of this flare up and now is much reduced in size.    She also has IFG, that is being followed by Dr Sanda Klein. Had labs recently for this with her.  I have reviewed the patient's past medical history, family and social history, surgical history, medications and allergies.  Past Medical History  Diagnosis Date  . Thyroid disease   . Osteoporosis   . GERD (gastroesophageal reflux disease)   . Hyperlipidemia   . Arthritis   . Bilateral  ovarian cysts     laterality not known   Past Surgical History  Procedure Laterality Date  . Breast surgery    . Bunionectomy Right   . Back surgery     Family History  Problem Relation Age of Onset  . Asthma Mother   . Hypertension Mother   . Diabetes Mother   . Heart disease Father   . Early death Father   . Hypertension Sister   . Diabetes Sister   . Diabetes Maternal Grandmother    History   Social History  . Marital Status: Divorced    Spouse Name: N/A  . Number of Children: N/A  . Years of Education: N/A   Occupational History  . Not on file.   Social History Main Topics  . Smoking status: Never Smoker   . Smokeless tobacco: Never Used  . Alcohol Use: No  . Drug Use: No  . Sexual Activity: Not on file   Other Topics Concern  . Not on file   Social History Narrative   No current outpatient prescriptions on file prior to visit.   No current facility-administered medications on file prior to visit.   Allergies  Allergen Reactions  . Codeine Other (See Comments)    Ears ringing  . Lovastatin Rash  . Nickel Rash  . Tape Rash      ROS: Review of Systems: [x]  complains of  [  ] denies General:   [  ] Recent weight change [ x ] Fatigue  [  ] Loss of appetite Eyes: [ x ]  Vision Difficulty [ x ]  Eye pain ENT: [  ]  Hearing difficulty [  ]  Difficulty Swallowing CVS: [  ] Chest pain [  ]  Palpitations/Irregular Heart beat [  ]  Shortness of breath lying flat [  ] Swelling of legs Resp: [  ] Frequent Cough [  ] Shortness of Breath  [  ]  Wheezing GI: [  ] Heartburn  [  ] Nausea or Vomiting  [  ] Diarrhea [x  ] Constipation  [  ] Abdominal Pain GU: [ x ]  Polyuria  [  ]  nocturia Bones/joints:  [  ]  Muscle aches  [  ] Joint Pain  [  ] Bone pain Skin/Hair/Nails: [ x ]  Rash  [  ] New stretch marks [  ]  Itching [ x ] Hair loss [ x ]  Excessive hair growth-recently during rosacea flare Reproduction: [  ] Low sexual desire , [  ]  Women: Menstrual cycle  problems [  ]  Women: Breast Discharge [  ] Men: Difficulty with erections [  ]  Men: Enlarged Breasts CNS: [  ] Frequent  Headaches [ x ] Blurry vision [  ] Tremors [  ] Seizures [  ] Loss of consciousness [  ] Localized weakness Endocrine: [ x ]  Excess thirst [  ]  Feeling excessively hot [  ]  Feeling excessively cold Heme: [ x ]  Easy bruising [ x ]  Enlarged glands or lumps in neck Allergy: [  ]  Food allergies [ x ] Environmental allergies  PE: BP 112/64 mmHg  Pulse 78  Resp 14  Ht 4\' 9"  (1.448 m)  Wt 109 lb 4 oz (49.555 kg)  BMI 23.63 kg/m2  SpO2 99% Wt Readings from Last 3 Encounters:  08/03/14 109 lb 4 oz (49.555 kg)    HEENT: [place]/AT, EOMI, no icterus, no proptosis, no chemosis, no mild lid lag, no retraction, eyes close completely Neck: thyroid gland - smooth, non-tender, no erythema, no tracheal deviation; negative Pemberton's sign; no lymphadenopathy; no bruits, area of concern ( referred to by patient) is near the trachea , around level 3, left neck >>no enlarged lymph node palpable, no tenderness Lungs: good air entry, clear bilaterally Heart: S1&S2 normal, regular rate & rhythm; no murmurs, rubs or gallops Abd: soft, NT, ND, no HSM, +BS Ext: no tremor in hands bilaterally, no edema, 2+ DP/PT pulses, good muscle mass Neuro: normal gait, 2+ reflexes bilaterally, normal 5/5 strength, no proximal myopathy  Derm: no pretibial myxoedema/skin dryness   ASSESSMENT: Problem List Items Addressed This Visit      Endocrine   Hypothyroidism - Primary    Clinically euthyroid today.  Explained that her thyroid requirements might go down with advancing age.  Goal TSH should be between 1-2.5 for her. Continue current levothyroxine for now and adjust dose based on labs.       Relevant Orders   TSH   T4, free   US Soft Tissue Head/Neck   Left thyroid nodule    Reviewed thyroid nodule etiology, follow up, possible pathology reports. Will obtain actual thyroid US reports and  thyroid FNA report from Dr Joycie Peek office.  By her reports and notes review, it is encouraging that the nodule continues to be stable and sub cm without any concerning features or LAD. The area of concern on the left upper neck today ( getting better) might have been an enlarged lymph node at the time of the rosacea flare up.  We discussed about obtaining a follow up Thyroid US to evaluate the nodule and cervical lymph nodes either now or in a few months.  The patient is agreeable and would like to get the thyroid US at this time.  Its been ordered and I will review the report, once I get it.  If size stable nodule, then would recommend follow up US imaging in 2-3 years. Sooner if any enlargement noted in region of thyroid or any compressive symptoms.  Reassurance was provided at this visit.        Relevant Orders   TSH   T4, free   US Soft Tissue Head/Neck       -RTC in 1 year  [facility]  08/03/2014   4:38 PM

## 2014-08-03 NOTE — Patient Instructions (Signed)
Test for thyroid function today.  Thyroid US ordered to evalaute size stability of thyroid nodule.   Please return in 1 year.

## 2014-08-03 NOTE — Assessment & Plan Note (Signed)
Reviewed thyroid nodule etiology, follow up, possible pathology reports. Will obtain actual thyroid US reports and thyroid FNA report from Dr Joycie Peek office.  By her reports and notes review, it is encouraging that the nodule continues to be stable and sub cm without any concerning features or LAD. The area of concern on the left upper neck today ( getting better) might have been an enlarged lymph node at the time of the rosacea flare up.  We discussed about obtaining a follow up Thyroid US to evaluate the nodule and cervical lymph nodes either now or in a few months.  The patient is agreeable and would like to get the thyroid US at this time.  Its been ordered and I will review the report, once I get it.  If size stable nodule, then would recommend follow up US imaging in 2-3 years. Sooner if any enlargement noted in region of thyroid or any compressive symptoms.  Reassurance was provided at this visit.

## 2014-08-05 DIAGNOSIS — L719 Rosacea, unspecified: Secondary | ICD-10-CM | POA: Diagnosis not present

## 2014-08-09 ENCOUNTER — Telehealth: Payer: Self-pay

## 2014-08-09 NOTE — Telephone Encounter (Signed)
-----   Message from Haydee Monica, MD sent at 08/03/2014  3:19 PM EDT ----- Regarding: outside records Please could you obtain her last thyroid US from [facility] along with prior thyroid FNA results. thanks

## 2014-08-09 NOTE — Telephone Encounter (Signed)
Medical release form faxed to [facility] on 08/03/14. Waiting records at this time. ([facility] usually mails records to [place].)

## 2014-08-17 ENCOUNTER — Ambulatory Visit
Admission: RE | Admit: 2014-08-17 | Discharge: 2014-08-17 | Disposition: A | Discharge status: Home / Self Care | Payer: Medicare Other | Source: Ambulatory Visit | Attending: Endocrinology | Admitting: Endocrinology

## 2014-08-17 DIAGNOSIS — E041 Nontoxic single thyroid nodule: Secondary | ICD-10-CM | POA: Diagnosis present

## 2014-08-17 DIAGNOSIS — E039 Hypothyroidism, unspecified: Secondary | ICD-10-CM

## 2014-09-06 DIAGNOSIS — M2042 Other hammer toe(s) (acquired), left foot: Secondary | ICD-10-CM | POA: Diagnosis not present

## 2014-09-06 DIAGNOSIS — M2012 Hallux valgus (acquired), left foot: Secondary | ICD-10-CM | POA: Diagnosis not present

## 2014-09-06 DIAGNOSIS — D2372 Other benign neoplasm of skin of left lower limb, including hip: Secondary | ICD-10-CM | POA: Diagnosis not present

## 2014-09-06 DIAGNOSIS — M79672 Pain in left foot: Secondary | ICD-10-CM | POA: Diagnosis not present

## 2014-09-28 DIAGNOSIS — D2372 Other benign neoplasm of skin of left lower limb, including hip: Secondary | ICD-10-CM | POA: Diagnosis not present

## 2014-09-28 DIAGNOSIS — M2012 Hallux valgus (acquired), left foot: Secondary | ICD-10-CM | POA: Diagnosis not present

## 2014-09-28 DIAGNOSIS — M79672 Pain in left foot: Secondary | ICD-10-CM | POA: Diagnosis not present

## 2014-09-28 DIAGNOSIS — M2042 Other hammer toe(s) (acquired), left foot: Secondary | ICD-10-CM | POA: Diagnosis not present

## 2014-11-01 DIAGNOSIS — L718 Other rosacea: Secondary | ICD-10-CM | POA: Insufficient documentation

## 2014-11-01 DIAGNOSIS — M21619 Bunion of unspecified foot: Secondary | ICD-10-CM | POA: Insufficient documentation

## 2014-11-01 DIAGNOSIS — E079 Disorder of thyroid, unspecified: Secondary | ICD-10-CM | POA: Insufficient documentation

## 2014-11-01 DIAGNOSIS — E559 Vitamin D deficiency, unspecified: Secondary | ICD-10-CM | POA: Insufficient documentation

## 2014-11-01 DIAGNOSIS — J309 Allergic rhinitis, unspecified: Secondary | ICD-10-CM | POA: Insufficient documentation

## 2014-11-01 DIAGNOSIS — M2040 Other hammer toe(s) (acquired), unspecified foot: Secondary | ICD-10-CM | POA: Insufficient documentation

## 2014-11-01 DIAGNOSIS — E785 Hyperlipidemia, unspecified: Secondary | ICD-10-CM | POA: Insufficient documentation

## 2014-11-01 DIAGNOSIS — G4733 Obstructive sleep apnea (adult) (pediatric): Secondary | ICD-10-CM | POA: Insufficient documentation

## 2014-11-01 DIAGNOSIS — H269 Unspecified cataract: Secondary | ICD-10-CM | POA: Insufficient documentation

## 2014-11-04 ENCOUNTER — Encounter: Payer: Self-pay | Admitting: Family Medicine

## 2014-11-04 ENCOUNTER — Ambulatory Visit (INDEPENDENT_AMBULATORY_CARE_PROVIDER_SITE_OTHER): Payer: Medicare Other | Admitting: Family Medicine

## 2014-11-04 VITALS — BP 125/76 | HR 76 | Temp 97.9°F | Ht <= 58 in | Wt 109.0 lb

## 2014-11-04 DIAGNOSIS — Z5181 Encounter for therapeutic drug level monitoring: Secondary | ICD-10-CM

## 2014-11-04 DIAGNOSIS — Z1239 Encounter for other screening for malignant neoplasm of breast: Secondary | ICD-10-CM

## 2014-11-04 DIAGNOSIS — R7301 Impaired fasting glucose: Secondary | ICD-10-CM | POA: Diagnosis not present

## 2014-11-04 DIAGNOSIS — Z0000 Encounter for general adult medical examination without abnormal findings: Secondary | ICD-10-CM

## 2014-11-04 DIAGNOSIS — M810 Age-related osteoporosis without current pathological fracture: Secondary | ICD-10-CM

## 2014-11-04 DIAGNOSIS — E785 Hyperlipidemia, unspecified: Secondary | ICD-10-CM

## 2014-11-04 NOTE — Progress Notes (Signed)
Patient: Angela Kelly, Female    DOB: 02-22-1946, 69 y.o.   MRN: [id number]  Visit Date: 11/04/2014  Today's Provider: Enid Derry, MD   Chief Complaint  Patient presents with  . Annual Exam    Medicare Annual Wellness Exam    Subjective:   Angela Kelly is a 69 y.o. female who presents today for her Subsequent Annual Wellness Visit.  Caregiver input:  n/a  HPI  Here for medicare visit Had thyroid US in June through [facility]; she says it was fine Doxycycline helping the eyes and skin  Review of Systems  Lump on the left side collar bone medially; sees someone for her neck and back; getting worse; he was having her sit in chair and holding head back for several minutes  Past Medical History  Diagnosis Date  . GERD (gastroesophageal reflux disease)   . Arthritis   . Bilateral ovarian cysts     laterality not known  . Osteoporosis   . OSA (obstructive sleep apnea)   . Hyperlipidemia   . Thyroid disease   . IFG (impaired fasting glucose)   . Cataract of both eyes   . Thyroid nodule     left, sees Endocrinology  . Ocular rosacea     see's [facility]  . Rosacea     telengiectatic  . Bunion   . Hammer toe   . Allergy   . History of pneumonia     in her 37's  . Elevated BP   . Shingles     left eye  . Fibrocystic breast   . Vitamin D deficiency disease   . Osteoarthritis cervical spine   . Osteoarthritis of both hands   . Deviated septum   . History of closed fracture of nasal bones      Past Surgical History  Procedure Laterality Date  . Breast surgery    . Bunionectomy Right   . Back surgery      lumbar laminectomy  . Biopsy thyroid    . Breast cyst aspiration Left     Family History  Problem Relation Age of Onset  . Asthma Mother   . Hypertension Mother   . Diabetes Mother   . Cancer Mother     Leukemia  . Peripheral vascular disease Mother   . Osteoporosis Mother   . Glaucoma Mother   . Heart disease Father   . Early death  Father   . Heart attack Father   . Hypertension Sister   . Diabetes Sister   . Diabetes Maternal Grandmother    History   Social History  . Marital Status: Divorced    Spouse Name: N/A  . Number of Children: N/A  . Years of Education: N/A   Occupational History  . Not on file.   Social History Main Topics  . Smoking status: Never Smoker   . Smokeless tobacco: Never Used  . Alcohol Use: No     Comment: occasional glass of wine  . Drug Use: No  . Sexual Activity: Not on file   Other Topics Concern  . Not on file   Social History Narrative   Outpatient Encounter Prescriptions as of 11/04/2014  Medication Sig Note  . B Complex-C (SUPER B COMPLEX PO) Take 1 capsule by mouth daily.   . beta carotene w/minerals (OCUVITE) tablet Take 1 tablet by mouth daily.   . Biotin 5000 MCG CAPS Take 1 capsule by mouth daily.   . calcium carbonate (OS-CAL)  600 MG TABS tablet Take 600 mg by mouth 2 (two) times daily with a meal.   . cholecalciferol (VITAMIN D) 1000 UNITS tablet Take 1,000 Units by mouth daily.   Marland Kitchen doxycycline (VIBRA-TABS) 100 MG tablet Take 100 mg by mouth 2 (two) times daily. 08/03/2014: Received from: External Pharmacy  . Ivermectin (SOOLANTRA) 1 % CREA Apply topically.   Marland Kitchen levothyroxine (SYNTHROID, LEVOTHROID) 75 MCG tablet Take 1 tablet by mouth daily. 08/03/2014: Received from: External Pharmacy Received Sig:   . loratadine (CLARITIN) 10 MG tablet Take 10 mg by mouth daily.   Marland Kitchen Lysine 1000 MG TABS Take 1 tablet by mouth daily.   . Multiple Vitamin (MULTIVITAMIN) capsule Take 1 capsule by mouth daily.   . Omega-3 Fatty Acids (FISH OIL PO) Take 2 capsules by mouth daily.   Marland Kitchen OVER THE COUNTER MEDICATION Take 1 capsule by mouth 2 (two) times daily. Termeric   . PAZEO 0.7 % SOLN Place 1 drop into both eyes daily. 08/03/2014: Received from: External Pharmacy Received Sig:   . Potassium Gluconate 595 MG CAPS Take 1 capsule by mouth daily.   . Probiotic Product (PROBIOTIC DAILY PO)  Take 1 capsule by mouth 2 (two) times daily.   . SOOLANTRA 1 % CREA Apply topically daily. to face 08/03/2014: Received from: External Pharmacy  . vitamin B-12 (CYANOCOBALAMIN) 100 MCG tablet Take 100 mcg by mouth daily.   . vitamin C (ASCORBIC ACID) 500 MG tablet Take 500 mg by mouth daily.   . vitamin E 400 UNIT capsule Take 400 Units by mouth daily.   . ciclesonide (OMNARIS) 50 MCG/ACT nasal spray Place 2 sprays into both nostrils daily.   . diphenhydrAMINE (BENADRYL) 25 mg capsule Take 25 mg by mouth every 4 (four) hours as needed.   . mometasone (NASONEX) 50 MCG/ACT nasal spray Place 2 sprays into the nose daily.   . [DISCONTINUED] hydrocortisone 2.5 % cream  08/03/2014: Received from: External Pharmacy   No facility-administered encounter medications on file as of 11/04/2014.   Functional Ability / Safety Screening 1.  Was the timed Get Up and Go test longer than 30 seconds?  no 2.  Does the patient need help with the phone, transportation, shopping,      preparing meals, housework, laundry, medications, or managing money?  no 3.  Does the patient's home have:  loose throw rugs in the hallway?   no      Grab bars in the bathroom? no      Handrails on the stairs?   not applicable, no stairs, there are rails to back porch; three steps to front door      Poor lighting?   yes 4.  Has the patient noticed any hearing difficulties?   no, just some ringing in the left ear; no hearing loss; no excessive aspirin  Fall Risk Assessment See under rooming  Depression Screen See under rooming Depression screen [facility] 2/9 11/04/2014  Decreased Interest 0  Down, Depressed, Hopeless 0  PHQ - 2 Score 0   Advanced Directives Does patient have a HCPOA?    no, oldest son Lorenza Evangelist is oldest son If yes, name and contact information:  Does patient have a living will or MOST form?  no  Objective:   Vitals: BP 125/76 mmHg  Pulse 76  Temp(Src) 97.9 F (36.6 C)  Ht 4\' 9"  (1.448 m)  Wt 109 lb (49.442  kg)  BMI 23.58 kg/m2  SpO2 100% Body mass index is 23.58 kg/(m^2).  Visual Acuity Screening   Right eye Left eye Both eyes  Without correction: 20/50 20/200   With correction:       Physical Exam  Neck: No thyroid mass and no thyromegaly (no palpable masses of the thyroid) present.  Musculoskeletal:  Bony enlargement over the medial left clavicle; also enlargement over sternal notch and medial right clavicle  Psychiatric: She has a normal mood and affect. Her speech is normal and behavior is normal. Judgment and thought content normal. Cognition and memory are normal.   Mood/affect:  euthymic Appearance:  Neat, casual  Cognitive Testing - 6-CIT  Correct? Score   What year is it? yes 0 Yes = 0    No = 4  What month is it? yes 0 Yes = 0    No = 3  Remember:     Pia [address]au, [place], [place]     What time is it? yes 0 Yes = 0    No = 3  Count backwards from 20 to 1 yes 0 Correct = 0    1 error = 2   More than 1 error = 4  Say the months of the year in reverse. yes 0 Correct = 0    1 error = 2   More than 1 error = 4  What address did I ask you to remember? yes 0 Correct = 0  1 error = 2    2 error = 4    3 error = 6    4 error = 8    All wrong = 10       TOTAL SCORE  0/28   Interpretation:  Normal  Normal (0-7) Abnormal (8-28)    Assessment & Plan:     Annual Wellness Visit  Reviewed patient's Family Medical History Reviewed and updated list of patient's medical providers Assessment of cognitive impairment was done Assessed patient's functional ability Established a written schedule for health screening [place] Completed and Reviewed  Exercise Activities and Dietary recommendations Goals    None    increase activity, healthy diet  Immunization History  Administered Date(s) Administered  . Pneumococcal Conjugate-13 11/02/2013  . Pneumococcal Polysaccharide-23 03/30/2011  . Tdap 04/16/2010  . Zoster 06/14/2013    Health Maintenance   Topic Date Due  . MAMMOGRAM  08/03/1993  . INFLUENZA VACCINE  11/15/2014  . TETANUS/TDAP  04/16/2020  . COLONOSCOPY  11/05/2023  . DEXA SCAN  Addressed  . ZOSTAVAX  Completed  . PNA vac Low Risk Adult  Completed   Note: colonoscopy due date is INCORRECT -- she is due November 05, 2017, discussed with patient Discussed health benefits of physical activity, and encouraged her to engage in regular exercise appropriate for her age and condition.   Meds ordered this encounter  Medications  . loratadine (CLARITIN) 10 MG tablet    Sig: Take 10 mg by mouth daily.  . Ivermectin (SOOLANTRA) 1 % CREA    Sig: Apply topically.    Current outpatient prescriptions:  .  B Complex-C (SUPER B COMPLEX PO), Take 1 capsule by mouth daily., Disp: , Rfl:  .  beta carotene w/minerals (OCUVITE) tablet, Take 1 tablet by mouth daily., Disp: , Rfl:  .  Biotin 5000 MCG CAPS, Take 1 capsule by mouth daily., Disp: , Rfl:  .  calcium carbonate (OS-CAL) 600 MG TABS tablet, Take 600 mg by mouth 2 (two) times daily with a meal., Disp: , Rfl:  .  cholecalciferol (VITAMIN D) 1000 UNITS tablet, Take 1,000 Units by mouth daily., Disp: , Rfl:  .  doxycycline (VIBRA-TABS) 100 MG tablet, Take 100 mg by mouth 2 (two) times daily., Disp: , Rfl: 5 .  Ivermectin (SOOLANTRA) 1 % CREA, Apply topically., Disp: , Rfl:  .  levothyroxine (SYNTHROID, LEVOTHROID) 75 MCG tablet, Take 1 tablet by mouth daily., Disp: , Rfl: 0 .  loratadine (CLARITIN) 10 MG tablet, Take 10 mg by mouth daily., Disp: , Rfl:  .  Lysine 1000 MG TABS, Take 1 tablet by mouth daily., Disp: , Rfl:  .  Multiple Vitamin (MULTIVITAMIN) capsule, Take 1 capsule by mouth daily., Disp: , Rfl:  .  Omega-3 Fatty Acids (FISH OIL PO), Take 2 capsules by mouth daily., Disp: , Rfl:  .  OVER THE COUNTER MEDICATION, Take 1 capsule by mouth 2 (two) times daily. Termeric, Disp: , Rfl:  .  PAZEO 0.7 % SOLN, Place 1 drop into both eyes daily., Disp: , Rfl: 5 .  Potassium Gluconate  595 MG CAPS, Take 1 capsule by mouth daily., Disp: , Rfl:  .  Probiotic Product (PROBIOTIC DAILY PO), Take 1 capsule by mouth 2 (two) times daily., Disp: , Rfl:  .  SOOLANTRA 1 % CREA, Apply topically daily. to face, Disp: , Rfl: 0 .  vitamin B-12 (CYANOCOBALAMIN) 100 MCG tablet, Take 100 mcg by mouth daily., Disp: , Rfl:  .  vitamin C (ASCORBIC ACID) 500 MG tablet, Take 500 mg by mouth daily., Disp: , Rfl:  .  vitamin E 400 UNIT capsule, Take 400 Units by mouth daily., Disp: , Rfl:  .  ciclesonide (OMNARIS) 50 MCG/ACT nasal spray, Place 2 sprays into both nostrils daily., Disp: , Rfl:  .  diphenhydrAMINE (BENADRYL) 25 mg capsule, Take 25 mg by mouth every 4 (four) hours as needed., Disp: , Rfl:  .  mometasone (NASONEX) 50 MCG/ACT nasal spray, Place 2 sprays into the nose daily., Disp: , Rfl:  Medications Discontinued During This Encounter  Medication Reason  . hydrocortisone 2.5 % cream Change in therapy   Next Medicare Wellness Visit in 12+ months  Problem List Items Addressed This Visit      Endocrine   IFG (impaired fasting glucose)    Check A1C; last glucose in April was normal      Relevant Orders   Hgb A1c w/o eAG     Musculoskeletal and Integument   Osteoporosis    She had been seeing another doctor, was on Reclast and then took a two year holiday; she goes back in March        Other   Hyperlipidemia   Relevant Orders   Lipid Panel w/o Chol/HDL Ratio    Other Visit Diagnoses    Health maintenance examination    -  Primary    medicare appropriate counseling and prevention    Relevant Orders    MM DIGITAL SCREENING BILATERAL    Breast cancer screening        mammogram ordered, number given to patient to call and schedule on her own    Relevant Orders    MM DIGITAL SCREENING BILATERAL    Medication monitoring encounter        Relevant Orders    ALT

## 2014-11-04 NOTE — Assessment & Plan Note (Signed)
She had been seeing another doctor, was on Reclast and then took a two year holiday; she goes back in March

## 2014-11-04 NOTE — Patient Instructions (Addendum)
Please do follow-up with Dr. Jefm Bryant about your osteoporosis Fall precautions encouraged Think about putting in grab bars in the bathroom and railings on the front stairs of your home Please do schedule a mammogram, call [phone] Aspirin 81 mg (coated) daily suggested for stroke prevention Do please see Dr. Jefm Bryant about the swelling over the joints in your upper chest wall Try to get 150 minutes of activity every week, build up gradually and safely Healthy eating (limit saturated fats, no more than 3 eggs per week, plenty of fruits and vegetables and whole grains Next medicare visit in 12+ months   Health Maintenance  Topic Date Due  . Mammogram  08/03/1993  . Flu Shot  11/15/2014  . Tetanus Vaccine  04/16/2020  . Colon Cancer Screening  11/05/2023  . DEXA scan (bone density measurement)  Addressed  . Shingles Vaccine  Completed  . Pneumonia vaccines  Completed   *November 05, 2012 was last colonoscopy and your next is due five years later around November 05, 2017* It is not due in 2025 as the table above suggests  Health Maintenance Adopting a healthy lifestyle and getting preventive care can go a long way to promote health and wellness. Talk with your health care provider about what schedule of regular examinations is right for you. This is a good chance for you to check in with your provider about disease prevention and staying healthy. In between checkups, there are plenty of things you can do on your own. Experts have done a lot of research about which lifestyle changes and preventive measures are most likely to keep you healthy. Ask your health care provider for more information. WEIGHT AND DIET  Eat a healthy diet  Be sure to include plenty of vegetables, fruits, low-fat dairy products, and lean protein.  Do not eat a lot of foods high in solid fats, added sugars, or salt.  Get regular exercise. This is one of the most important things you can do for your health.  Most  adults should exercise for at least 150 minutes each week. The exercise should increase your heart rate and make you sweat (moderate-intensity exercise).  Most adults should also do strengthening exercises at least twice a week. This is in addition to the moderate-intensity exercise.  Maintain a healthy weight  Body mass index (BMI) is a measurement that can be used to identify possible weight problems. It estimates body fat based on height and weight. Your health care provider can help determine your BMI and help you achieve or maintain a healthy weight.  For females 3 years of age and older:   A BMI below 18.5 is considered underweight.  A BMI of 18.5 to 24.9 is normal.  A BMI of 25 to 29.9 is considered overweight.  A BMI of 30 and above is considered obese.  Watch levels of cholesterol and blood lipids  You should start having your blood tested for lipids and cholesterol at 71 years of age, then have this test every 5 years.  You may need to have your cholesterol levels checked more often if:  Your lipid or cholesterol levels are high.  You are older than 71 years of age.  You are at high risk for heart disease.  CANCER SCREENING   Lung Cancer  Lung cancer screening is recommended for adults 43-58 years old who are at high risk for lung cancer because of a history of smoking.  A yearly low-dose CT scan of the lungs is recommended for  people who:  Currently smoke.  Have quit within the past 15 years.  Have at least a 30-pack-year history of smoking. A pack year is smoking an average of one pack of cigarettes a day for 1 year.  Yearly screening should continue until it has been 15 years since you quit.  Yearly screening should stop if you develop a health problem that would prevent you from having lung cancer treatment.  Breast Cancer  Practice breast self-awareness. This means understanding how your breasts normally appear and feel.  It also means doing  regular breast self-exams. Let your health care provider know about any changes, no matter how small.  If you are in your 20s or 30s, you should have a clinical breast exam (CBE) by a health care provider every 1-3 years as part of a regular health exam.  If you are 59 or older, have a CBE every year. Also consider having a breast X-ray (mammogram) every year.  If you have a family history of breast cancer, talk to your health care provider about genetic screening.  If you are at high risk for breast cancer, talk to your health care provider about having an MRI and a mammogram every year.  Breast cancer gene (BRCA) assessment is recommended for women who have family members with BRCA-related cancers. BRCA-related cancers include:  Breast.  Ovarian.  Tubal.  Peritoneal cancers.  Results of the assessment will determine the need for genetic counseling and BRCA1 and BRCA2 testing. Cervical Cancer Routine pelvic examinations to screen for cervical cancer are no longer recommended for nonpregnant women who are considered low risk for cancer of the pelvic organs (ovaries, uterus, and vagina) and who do not have symptoms. A pelvic examination may be necessary if you have symptoms including those associated with pelvic infections. Ask your health care provider if a screening pelvic exam is right for you.   The Pap test is the screening test for cervical cancer for women who are considered at risk.  If you had a hysterectomy for a problem that was not cancer or a condition that could lead to cancer, then you no longer need Pap tests.  If you are older than 65 years, and you have had normal Pap tests for the past 10 years, you no longer need to have Pap tests.  If you have had past treatment for cervical cancer or a condition that could lead to cancer, you need Pap tests and screening for cancer for at least 20 years after your treatment.  If you no longer get a Pap test, assess your risk  factors if they change (such as having a new sexual partner). This can affect whether you should start being screened again.  Some women have medical problems that increase their chance of getting cervical cancer. If this is the case for you, your health care provider may recommend more frequent screening and Pap tests.  The human papillomavirus (HPV) test is another test that may be used for cervical cancer screening. The HPV test looks for the virus that can cause cell changes in the cervix. The cells collected during the Pap test can be tested for HPV.  The HPV test can be used to screen women 61 years of age and older. Getting tested for HPV can extend the interval between normal Pap tests from three to five years.  An HPV test also should be used to screen women of any age who have unclear Pap test results.  After 71 years  of age, women should have HPV testing as often as Pap tests.  Colorectal Cancer  This type of cancer can be detected and often prevented.  Routine colorectal cancer screening usually begins at 71 years of age and continues through 71 years of age.  Your health care provider may recommend screening at an earlier age if you have risk factors for colon cancer.  Your health care provider may also recommend using home test kits to check for hidden blood in the stool.  A small camera at the end of a tube can be used to examine your colon directly (sigmoidoscopy or colonoscopy). This is done to check for the earliest forms of colorectal cancer.  Routine screening usually begins at age 55.  Direct examination of the colon should be repeated every 5-10 years through 71 years of age. However, you may need to be screened more often if early forms of precancerous polyps or small growths are found. Skin Cancer  Check your skin from head to toe regularly.  Tell your health care provider about any new moles or changes in moles, especially if there is a change in a mole's shape  or color.  Also tell your health care provider if you have a mole that is larger than the size of a pencil eraser.  Always use sunscreen. Apply sunscreen liberally and repeatedly throughout the day.  Protect yourself by wearing long sleeves, pants, a wide-brimmed hat, and sunglasses whenever you are outside. HEART DISEASE, DIABETES, AND HIGH BLOOD PRESSURE   Have your blood pressure checked at least every 1-2 years. High blood pressure causes heart disease and increases the risk of stroke.  If you are between 14 years and 69 years old, ask your health care provider if you should take aspirin to prevent strokes.  Have regular diabetes screenings. This involves taking a blood sample to check your fasting blood sugar level.  If you are at a normal weight and have a low risk for diabetes, have this test once every three years after 71 years of age.  If you are overweight and have a high risk for diabetes, consider being tested at a younger age or more often. PREVENTING INFECTION  Hepatitis B  If you have a higher risk for hepatitis B, you should be screened for this virus. You are considered at high risk for hepatitis B if:  You were born in a country where hepatitis B is common. Ask your health care provider which countries are considered high risk.  Your parents were born in a high-risk country, and you have not been immunized against hepatitis B (hepatitis B vaccine).  You have HIV or AIDS.  You use needles to inject street drugs.  You live with someone who has hepatitis B.  You have had sex with someone who has hepatitis B.  You get hemodialysis treatment.  You take certain medicines for conditions, including cancer, organ transplantation, and autoimmune conditions. Hepatitis C  Blood testing is recommended for:  Everyone born from [birth year] through 1967.  Anyone with known risk factors for hepatitis C. Sexually transmitted infections (STIs)  You should be screened for  sexually transmitted infections (STIs) including gonorrhea and chlamydia if:  You are sexually active and are younger than 71 years of age.  You are older than 71 years of age and your health care provider tells you that you are at risk for this type of infection.  Your sexual activity has changed since you were last screened and you are  at an increased risk for chlamydia or gonorrhea. Ask your health care provider if you are at risk.  If you do not have HIV, but are at risk, it may be recommended that you take a prescription medicine daily to prevent HIV infection. This is called pre-exposure prophylaxis (PrEP). You are considered at risk if:  You are sexually active and do not regularly use condoms or know the HIV status of your partner(s).  You take drugs by injection.  You are sexually active with a partner who has HIV. Talk with your health care provider about whether you are at high risk of being infected with HIV. If you choose to begin PrEP, you should first be tested for HIV. You should then be tested every 3 months for as long as you are taking PrEP.  PREGNANCY   If you are premenopausal and you may become pregnant, ask your health care provider about preconception counseling.  If you may become pregnant, take 400 to 800 micrograms (mcg) of folic acid every day.  If you want to prevent pregnancy, talk to your health care provider about birth control (contraception). OSTEOPOROSIS AND MENOPAUSE   Osteoporosis is a disease in which the bones lose minerals and strength with aging. This can result in serious bone fractures. Your risk for osteoporosis can be identified using a bone density scan.  If you are 58 years of age or older, or if you are at risk for osteoporosis and fractures, ask your health care provider if you should be screened.  Ask your health care provider whether you should take a calcium or vitamin D supplement to lower your risk for osteoporosis.  Menopause may  have certain physical symptoms and risks.  Hormone replacement therapy may reduce some of these symptoms and risks. Talk to your health care provider about whether hormone replacement therapy is right for you.  HOME CARE INSTRUCTIONS   Schedule regular health, dental, and eye exams.  Stay current with your immunizations.   Do not use any tobacco products including cigarettes, chewing tobacco, or electronic cigarettes.  If you are pregnant, do not drink alcohol.  If you are breastfeeding, limit how much and how often you drink alcohol.  Limit alcohol intake to no more than 1 drink per day for nonpregnant women. One drink equals 12 ounces of beer, 5 ounces of wine, or 1 ounces of hard liquor.  Do not use street drugs.  Do not share needles.  Ask your health care provider for help if you need support or information about quitting drugs.  Tell your health care provider if you often feel depressed.  Tell your health care provider if you have ever been abused or do not feel safe at home. Document Released: 10/16/2010 Document Revised: 08/17/2013 Document Reviewed: 03/04/2013 [facility] Patient Information 2015 [place], [place]. This information is not intended to replace advice given to you by your health care provider. Make sure you discuss any questions you have with your health care provider.

## 2014-11-04 NOTE — Assessment & Plan Note (Signed)
Check A1C; last glucose in April was normal

## 2014-11-05 LAB — LIPID PANEL W/O CHOL/HDL RATIO
Cholesterol, Total: 222 mg/dL — ABNORMAL HIGH (ref 100–199)
HDL: 65 mg/dL (ref >39)
LDL Calculated: 135 mg/dL — ABNORMAL HIGH (ref 0–99)
Triglycerides: 109 mg/dL (ref 0–149)
VLDL Cholesterol Cal: 22 mg/dL (ref 5–40)

## 2014-11-05 LAB — HGB A1C W/O EAG: Hgb A1c MFr Bld: 5.8 % — ABNORMAL HIGH (ref 4.8–5.6)

## 2014-11-05 LAB — ALT: ALT: 16 IU/L (ref 0–32)

## 2014-11-08 ENCOUNTER — Encounter: Payer: Self-pay | Admitting: Family Medicine

## 2014-11-12 ENCOUNTER — Ambulatory Visit: Payer: Medicare Other

## 2014-11-25 DIAGNOSIS — M25511 Pain in right shoulder: Secondary | ICD-10-CM | POA: Diagnosis not present

## 2014-11-25 DIAGNOSIS — M19011 Primary osteoarthritis, right shoulder: Secondary | ICD-10-CM | POA: Diagnosis not present

## 2014-11-25 DIAGNOSIS — M8580 Other specified disorders of bone density and structure, unspecified site: Secondary | ICD-10-CM | POA: Diagnosis not present

## 2014-11-25 DIAGNOSIS — J8410 Pulmonary fibrosis, unspecified: Secondary | ICD-10-CM | POA: Diagnosis not present

## 2014-11-26 ENCOUNTER — Ambulatory Visit: Payer: Medicare Other

## 2014-11-29 DIAGNOSIS — M9903 Segmental and somatic dysfunction of lumbar region: Secondary | ICD-10-CM | POA: Diagnosis not present

## 2014-11-29 DIAGNOSIS — M5030 Other cervical disc degeneration, unspecified cervical region: Secondary | ICD-10-CM | POA: Diagnosis not present

## 2014-11-29 DIAGNOSIS — M9902 Segmental and somatic dysfunction of thoracic region: Secondary | ICD-10-CM | POA: Diagnosis not present

## 2014-11-29 DIAGNOSIS — M9901 Segmental and somatic dysfunction of cervical region: Secondary | ICD-10-CM | POA: Diagnosis not present

## 2014-11-29 DIAGNOSIS — M5136 Other intervertebral disc degeneration, lumbar region: Secondary | ICD-10-CM | POA: Diagnosis not present

## 2014-11-29 DIAGNOSIS — M5134 Other intervertebral disc degeneration, thoracic region: Secondary | ICD-10-CM | POA: Diagnosis not present

## 2014-11-29 DIAGNOSIS — M546 Pain in thoracic spine: Secondary | ICD-10-CM | POA: Diagnosis not present

## 2014-11-29 DIAGNOSIS — M542 Cervicalgia: Secondary | ICD-10-CM | POA: Diagnosis not present

## 2014-11-29 DIAGNOSIS — M545 Low back pain: Secondary | ICD-10-CM | POA: Diagnosis not present

## 2014-12-01 DIAGNOSIS — M546 Pain in thoracic spine: Secondary | ICD-10-CM | POA: Diagnosis not present

## 2014-12-01 DIAGNOSIS — M5030 Other cervical disc degeneration, unspecified cervical region: Secondary | ICD-10-CM | POA: Diagnosis not present

## 2014-12-01 DIAGNOSIS — M9903 Segmental and somatic dysfunction of lumbar region: Secondary | ICD-10-CM | POA: Diagnosis not present

## 2014-12-01 DIAGNOSIS — M545 Low back pain: Secondary | ICD-10-CM | POA: Diagnosis not present

## 2014-12-01 DIAGNOSIS — M9901 Segmental and somatic dysfunction of cervical region: Secondary | ICD-10-CM | POA: Diagnosis not present

## 2014-12-01 DIAGNOSIS — M9902 Segmental and somatic dysfunction of thoracic region: Secondary | ICD-10-CM | POA: Diagnosis not present

## 2014-12-01 DIAGNOSIS — M5136 Other intervertebral disc degeneration, lumbar region: Secondary | ICD-10-CM | POA: Diagnosis not present

## 2014-12-01 DIAGNOSIS — M5134 Other intervertebral disc degeneration, thoracic region: Secondary | ICD-10-CM | POA: Diagnosis not present

## 2014-12-01 DIAGNOSIS — M542 Cervicalgia: Secondary | ICD-10-CM | POA: Diagnosis not present

## 2014-12-03 DIAGNOSIS — M9901 Segmental and somatic dysfunction of cervical region: Secondary | ICD-10-CM | POA: Diagnosis not present

## 2014-12-03 DIAGNOSIS — M546 Pain in thoracic spine: Secondary | ICD-10-CM | POA: Diagnosis not present

## 2014-12-03 DIAGNOSIS — M9903 Segmental and somatic dysfunction of lumbar region: Secondary | ICD-10-CM | POA: Diagnosis not present

## 2014-12-03 DIAGNOSIS — M5134 Other intervertebral disc degeneration, thoracic region: Secondary | ICD-10-CM | POA: Diagnosis not present

## 2014-12-03 DIAGNOSIS — M542 Cervicalgia: Secondary | ICD-10-CM | POA: Diagnosis not present

## 2014-12-03 DIAGNOSIS — M9902 Segmental and somatic dysfunction of thoracic region: Secondary | ICD-10-CM | POA: Diagnosis not present

## 2014-12-03 DIAGNOSIS — M5030 Other cervical disc degeneration, unspecified cervical region: Secondary | ICD-10-CM | POA: Diagnosis not present

## 2014-12-03 DIAGNOSIS — M545 Low back pain: Secondary | ICD-10-CM | POA: Diagnosis not present

## 2014-12-03 DIAGNOSIS — M5136 Other intervertebral disc degeneration, lumbar region: Secondary | ICD-10-CM | POA: Diagnosis not present

## 2014-12-06 DIAGNOSIS — M5134 Other intervertebral disc degeneration, thoracic region: Secondary | ICD-10-CM | POA: Diagnosis not present

## 2014-12-06 DIAGNOSIS — M542 Cervicalgia: Secondary | ICD-10-CM | POA: Diagnosis not present

## 2014-12-06 DIAGNOSIS — M5136 Other intervertebral disc degeneration, lumbar region: Secondary | ICD-10-CM | POA: Diagnosis not present

## 2014-12-06 DIAGNOSIS — M9902 Segmental and somatic dysfunction of thoracic region: Secondary | ICD-10-CM | POA: Diagnosis not present

## 2014-12-06 DIAGNOSIS — M545 Low back pain: Secondary | ICD-10-CM | POA: Diagnosis not present

## 2014-12-06 DIAGNOSIS — M9901 Segmental and somatic dysfunction of cervical region: Secondary | ICD-10-CM | POA: Diagnosis not present

## 2014-12-06 DIAGNOSIS — M5030 Other cervical disc degeneration, unspecified cervical region: Secondary | ICD-10-CM | POA: Diagnosis not present

## 2014-12-06 DIAGNOSIS — M546 Pain in thoracic spine: Secondary | ICD-10-CM | POA: Diagnosis not present

## 2014-12-06 DIAGNOSIS — M9903 Segmental and somatic dysfunction of lumbar region: Secondary | ICD-10-CM | POA: Diagnosis not present

## 2014-12-07 DIAGNOSIS — H2513 Age-related nuclear cataract, bilateral: Secondary | ICD-10-CM | POA: Diagnosis not present

## 2014-12-08 DIAGNOSIS — M9903 Segmental and somatic dysfunction of lumbar region: Secondary | ICD-10-CM | POA: Diagnosis not present

## 2014-12-08 DIAGNOSIS — M545 Low back pain: Secondary | ICD-10-CM | POA: Diagnosis not present

## 2014-12-08 DIAGNOSIS — M5030 Other cervical disc degeneration, unspecified cervical region: Secondary | ICD-10-CM | POA: Diagnosis not present

## 2014-12-08 DIAGNOSIS — M542 Cervicalgia: Secondary | ICD-10-CM | POA: Diagnosis not present

## 2014-12-08 DIAGNOSIS — M9901 Segmental and somatic dysfunction of cervical region: Secondary | ICD-10-CM | POA: Diagnosis not present

## 2014-12-08 DIAGNOSIS — M5134 Other intervertebral disc degeneration, thoracic region: Secondary | ICD-10-CM | POA: Diagnosis not present

## 2014-12-08 DIAGNOSIS — M546 Pain in thoracic spine: Secondary | ICD-10-CM | POA: Diagnosis not present

## 2014-12-08 DIAGNOSIS — M5136 Other intervertebral disc degeneration, lumbar region: Secondary | ICD-10-CM | POA: Diagnosis not present

## 2014-12-08 DIAGNOSIS — M9902 Segmental and somatic dysfunction of thoracic region: Secondary | ICD-10-CM | POA: Diagnosis not present

## 2014-12-10 DIAGNOSIS — M545 Low back pain: Secondary | ICD-10-CM | POA: Diagnosis not present

## 2014-12-10 DIAGNOSIS — M5030 Other cervical disc degeneration, unspecified cervical region: Secondary | ICD-10-CM | POA: Diagnosis not present

## 2014-12-10 DIAGNOSIS — M9902 Segmental and somatic dysfunction of thoracic region: Secondary | ICD-10-CM | POA: Diagnosis not present

## 2014-12-10 DIAGNOSIS — M5134 Other intervertebral disc degeneration, thoracic region: Secondary | ICD-10-CM | POA: Diagnosis not present

## 2014-12-10 DIAGNOSIS — M5136 Other intervertebral disc degeneration, lumbar region: Secondary | ICD-10-CM | POA: Diagnosis not present

## 2014-12-10 DIAGNOSIS — M546 Pain in thoracic spine: Secondary | ICD-10-CM | POA: Diagnosis not present

## 2014-12-10 DIAGNOSIS — M542 Cervicalgia: Secondary | ICD-10-CM | POA: Diagnosis not present

## 2014-12-10 DIAGNOSIS — M9903 Segmental and somatic dysfunction of lumbar region: Secondary | ICD-10-CM | POA: Diagnosis not present

## 2014-12-10 DIAGNOSIS — M9901 Segmental and somatic dysfunction of cervical region: Secondary | ICD-10-CM | POA: Diagnosis not present

## 2014-12-13 DIAGNOSIS — M5030 Other cervical disc degeneration, unspecified cervical region: Secondary | ICD-10-CM | POA: Diagnosis not present

## 2014-12-13 DIAGNOSIS — M9901 Segmental and somatic dysfunction of cervical region: Secondary | ICD-10-CM | POA: Diagnosis not present

## 2014-12-13 DIAGNOSIS — M9902 Segmental and somatic dysfunction of thoracic region: Secondary | ICD-10-CM | POA: Diagnosis not present

## 2014-12-13 DIAGNOSIS — M542 Cervicalgia: Secondary | ICD-10-CM | POA: Diagnosis not present

## 2014-12-13 DIAGNOSIS — M9903 Segmental and somatic dysfunction of lumbar region: Secondary | ICD-10-CM | POA: Diagnosis not present

## 2014-12-13 DIAGNOSIS — M5134 Other intervertebral disc degeneration, thoracic region: Secondary | ICD-10-CM | POA: Diagnosis not present

## 2014-12-13 DIAGNOSIS — M546 Pain in thoracic spine: Secondary | ICD-10-CM | POA: Diagnosis not present

## 2014-12-13 DIAGNOSIS — M545 Low back pain: Secondary | ICD-10-CM | POA: Diagnosis not present

## 2014-12-13 DIAGNOSIS — M5136 Other intervertebral disc degeneration, lumbar region: Secondary | ICD-10-CM | POA: Diagnosis not present

## 2014-12-15 DIAGNOSIS — M545 Low back pain: Secondary | ICD-10-CM | POA: Diagnosis not present

## 2014-12-15 DIAGNOSIS — M9902 Segmental and somatic dysfunction of thoracic region: Secondary | ICD-10-CM | POA: Diagnosis not present

## 2014-12-15 DIAGNOSIS — M5134 Other intervertebral disc degeneration, thoracic region: Secondary | ICD-10-CM | POA: Diagnosis not present

## 2014-12-15 DIAGNOSIS — M9903 Segmental and somatic dysfunction of lumbar region: Secondary | ICD-10-CM | POA: Diagnosis not present

## 2014-12-15 DIAGNOSIS — M9901 Segmental and somatic dysfunction of cervical region: Secondary | ICD-10-CM | POA: Diagnosis not present

## 2014-12-15 DIAGNOSIS — M546 Pain in thoracic spine: Secondary | ICD-10-CM | POA: Diagnosis not present

## 2014-12-15 DIAGNOSIS — M5136 Other intervertebral disc degeneration, lumbar region: Secondary | ICD-10-CM | POA: Diagnosis not present

## 2014-12-15 DIAGNOSIS — M542 Cervicalgia: Secondary | ICD-10-CM | POA: Diagnosis not present

## 2014-12-15 DIAGNOSIS — M5030 Other cervical disc degeneration, unspecified cervical region: Secondary | ICD-10-CM | POA: Diagnosis not present

## 2014-12-17 DIAGNOSIS — M5030 Other cervical disc degeneration, unspecified cervical region: Secondary | ICD-10-CM | POA: Diagnosis not present

## 2014-12-17 DIAGNOSIS — M5134 Other intervertebral disc degeneration, thoracic region: Secondary | ICD-10-CM | POA: Diagnosis not present

## 2014-12-17 DIAGNOSIS — M9902 Segmental and somatic dysfunction of thoracic region: Secondary | ICD-10-CM | POA: Diagnosis not present

## 2014-12-17 DIAGNOSIS — M546 Pain in thoracic spine: Secondary | ICD-10-CM | POA: Diagnosis not present

## 2014-12-17 DIAGNOSIS — M542 Cervicalgia: Secondary | ICD-10-CM | POA: Diagnosis not present

## 2014-12-17 DIAGNOSIS — M5136 Other intervertebral disc degeneration, lumbar region: Secondary | ICD-10-CM | POA: Diagnosis not present

## 2014-12-17 DIAGNOSIS — M9901 Segmental and somatic dysfunction of cervical region: Secondary | ICD-10-CM | POA: Diagnosis not present

## 2014-12-17 DIAGNOSIS — M9903 Segmental and somatic dysfunction of lumbar region: Secondary | ICD-10-CM | POA: Diagnosis not present

## 2014-12-17 DIAGNOSIS — M545 Low back pain: Secondary | ICD-10-CM | POA: Diagnosis not present

## 2014-12-22 DIAGNOSIS — M5030 Other cervical disc degeneration, unspecified cervical region: Secondary | ICD-10-CM | POA: Diagnosis not present

## 2014-12-22 DIAGNOSIS — M5136 Other intervertebral disc degeneration, lumbar region: Secondary | ICD-10-CM | POA: Diagnosis not present

## 2014-12-22 DIAGNOSIS — M9902 Segmental and somatic dysfunction of thoracic region: Secondary | ICD-10-CM | POA: Diagnosis not present

## 2014-12-22 DIAGNOSIS — M542 Cervicalgia: Secondary | ICD-10-CM | POA: Diagnosis not present

## 2014-12-22 DIAGNOSIS — M9901 Segmental and somatic dysfunction of cervical region: Secondary | ICD-10-CM | POA: Diagnosis not present

## 2014-12-22 DIAGNOSIS — M546 Pain in thoracic spine: Secondary | ICD-10-CM | POA: Diagnosis not present

## 2014-12-22 DIAGNOSIS — M545 Low back pain: Secondary | ICD-10-CM | POA: Diagnosis not present

## 2014-12-22 DIAGNOSIS — M5134 Other intervertebral disc degeneration, thoracic region: Secondary | ICD-10-CM | POA: Diagnosis not present

## 2014-12-22 DIAGNOSIS — M9903 Segmental and somatic dysfunction of lumbar region: Secondary | ICD-10-CM | POA: Diagnosis not present

## 2014-12-24 DIAGNOSIS — M545 Low back pain: Secondary | ICD-10-CM | POA: Diagnosis not present

## 2014-12-24 DIAGNOSIS — M542 Cervicalgia: Secondary | ICD-10-CM | POA: Diagnosis not present

## 2014-12-24 DIAGNOSIS — M5136 Other intervertebral disc degeneration, lumbar region: Secondary | ICD-10-CM | POA: Diagnosis not present

## 2014-12-24 DIAGNOSIS — M5134 Other intervertebral disc degeneration, thoracic region: Secondary | ICD-10-CM | POA: Diagnosis not present

## 2014-12-24 DIAGNOSIS — M5030 Other cervical disc degeneration, unspecified cervical region: Secondary | ICD-10-CM | POA: Diagnosis not present

## 2014-12-24 DIAGNOSIS — M9902 Segmental and somatic dysfunction of thoracic region: Secondary | ICD-10-CM | POA: Diagnosis not present

## 2014-12-24 DIAGNOSIS — M9903 Segmental and somatic dysfunction of lumbar region: Secondary | ICD-10-CM | POA: Diagnosis not present

## 2014-12-24 DIAGNOSIS — M546 Pain in thoracic spine: Secondary | ICD-10-CM | POA: Diagnosis not present

## 2014-12-24 DIAGNOSIS — M9901 Segmental and somatic dysfunction of cervical region: Secondary | ICD-10-CM | POA: Diagnosis not present

## 2014-12-27 DIAGNOSIS — M5030 Other cervical disc degeneration, unspecified cervical region: Secondary | ICD-10-CM | POA: Diagnosis not present

## 2014-12-27 DIAGNOSIS — M9902 Segmental and somatic dysfunction of thoracic region: Secondary | ICD-10-CM | POA: Diagnosis not present

## 2014-12-27 DIAGNOSIS — M545 Low back pain: Secondary | ICD-10-CM | POA: Diagnosis not present

## 2014-12-27 DIAGNOSIS — M5136 Other intervertebral disc degeneration, lumbar region: Secondary | ICD-10-CM | POA: Diagnosis not present

## 2014-12-27 DIAGNOSIS — M9903 Segmental and somatic dysfunction of lumbar region: Secondary | ICD-10-CM | POA: Diagnosis not present

## 2014-12-27 DIAGNOSIS — M542 Cervicalgia: Secondary | ICD-10-CM | POA: Diagnosis not present

## 2014-12-27 DIAGNOSIS — M9901 Segmental and somatic dysfunction of cervical region: Secondary | ICD-10-CM | POA: Diagnosis not present

## 2014-12-27 DIAGNOSIS — M546 Pain in thoracic spine: Secondary | ICD-10-CM | POA: Diagnosis not present

## 2014-12-27 DIAGNOSIS — M5134 Other intervertebral disc degeneration, thoracic region: Secondary | ICD-10-CM | POA: Diagnosis not present

## 2014-12-31 DIAGNOSIS — M9902 Segmental and somatic dysfunction of thoracic region: Secondary | ICD-10-CM | POA: Diagnosis not present

## 2014-12-31 DIAGNOSIS — M5134 Other intervertebral disc degeneration, thoracic region: Secondary | ICD-10-CM | POA: Diagnosis not present

## 2014-12-31 DIAGNOSIS — M5136 Other intervertebral disc degeneration, lumbar region: Secondary | ICD-10-CM | POA: Diagnosis not present

## 2014-12-31 DIAGNOSIS — M9903 Segmental and somatic dysfunction of lumbar region: Secondary | ICD-10-CM | POA: Diagnosis not present

## 2014-12-31 DIAGNOSIS — M9901 Segmental and somatic dysfunction of cervical region: Secondary | ICD-10-CM | POA: Diagnosis not present

## 2014-12-31 DIAGNOSIS — M542 Cervicalgia: Secondary | ICD-10-CM | POA: Diagnosis not present

## 2014-12-31 DIAGNOSIS — M5030 Other cervical disc degeneration, unspecified cervical region: Secondary | ICD-10-CM | POA: Diagnosis not present

## 2014-12-31 DIAGNOSIS — M545 Low back pain: Secondary | ICD-10-CM | POA: Diagnosis not present

## 2014-12-31 DIAGNOSIS — M546 Pain in thoracic spine: Secondary | ICD-10-CM | POA: Diagnosis not present

## 2015-01-03 DIAGNOSIS — M9901 Segmental and somatic dysfunction of cervical region: Secondary | ICD-10-CM | POA: Diagnosis not present

## 2015-01-03 DIAGNOSIS — M9902 Segmental and somatic dysfunction of thoracic region: Secondary | ICD-10-CM | POA: Diagnosis not present

## 2015-01-03 DIAGNOSIS — M545 Low back pain: Secondary | ICD-10-CM | POA: Diagnosis not present

## 2015-01-03 DIAGNOSIS — M546 Pain in thoracic spine: Secondary | ICD-10-CM | POA: Diagnosis not present

## 2015-01-03 DIAGNOSIS — M542 Cervicalgia: Secondary | ICD-10-CM | POA: Diagnosis not present

## 2015-01-03 DIAGNOSIS — M5134 Other intervertebral disc degeneration, thoracic region: Secondary | ICD-10-CM | POA: Diagnosis not present

## 2015-01-03 DIAGNOSIS — M5136 Other intervertebral disc degeneration, lumbar region: Secondary | ICD-10-CM | POA: Diagnosis not present

## 2015-01-03 DIAGNOSIS — M9903 Segmental and somatic dysfunction of lumbar region: Secondary | ICD-10-CM | POA: Diagnosis not present

## 2015-01-03 DIAGNOSIS — M5030 Other cervical disc degeneration, unspecified cervical region: Secondary | ICD-10-CM | POA: Diagnosis not present

## 2015-01-07 DIAGNOSIS — M9902 Segmental and somatic dysfunction of thoracic region: Secondary | ICD-10-CM | POA: Diagnosis not present

## 2015-01-07 DIAGNOSIS — M546 Pain in thoracic spine: Secondary | ICD-10-CM | POA: Diagnosis not present

## 2015-01-07 DIAGNOSIS — M545 Low back pain: Secondary | ICD-10-CM | POA: Diagnosis not present

## 2015-01-07 DIAGNOSIS — M5134 Other intervertebral disc degeneration, thoracic region: Secondary | ICD-10-CM | POA: Diagnosis not present

## 2015-01-07 DIAGNOSIS — M542 Cervicalgia: Secondary | ICD-10-CM | POA: Diagnosis not present

## 2015-01-07 DIAGNOSIS — M5030 Other cervical disc degeneration, unspecified cervical region: Secondary | ICD-10-CM | POA: Diagnosis not present

## 2015-01-07 DIAGNOSIS — M9901 Segmental and somatic dysfunction of cervical region: Secondary | ICD-10-CM | POA: Diagnosis not present

## 2015-01-07 DIAGNOSIS — M9903 Segmental and somatic dysfunction of lumbar region: Secondary | ICD-10-CM | POA: Diagnosis not present

## 2015-01-07 DIAGNOSIS — M5136 Other intervertebral disc degeneration, lumbar region: Secondary | ICD-10-CM | POA: Diagnosis not present

## 2015-01-10 DIAGNOSIS — M9903 Segmental and somatic dysfunction of lumbar region: Secondary | ICD-10-CM | POA: Diagnosis not present

## 2015-01-10 DIAGNOSIS — M5136 Other intervertebral disc degeneration, lumbar region: Secondary | ICD-10-CM | POA: Diagnosis not present

## 2015-01-10 DIAGNOSIS — M9902 Segmental and somatic dysfunction of thoracic region: Secondary | ICD-10-CM | POA: Diagnosis not present

## 2015-01-10 DIAGNOSIS — M545 Low back pain: Secondary | ICD-10-CM | POA: Diagnosis not present

## 2015-01-10 DIAGNOSIS — M5030 Other cervical disc degeneration, unspecified cervical region: Secondary | ICD-10-CM | POA: Diagnosis not present

## 2015-01-10 DIAGNOSIS — M542 Cervicalgia: Secondary | ICD-10-CM | POA: Diagnosis not present

## 2015-01-10 DIAGNOSIS — M9901 Segmental and somatic dysfunction of cervical region: Secondary | ICD-10-CM | POA: Diagnosis not present

## 2015-01-10 DIAGNOSIS — M546 Pain in thoracic spine: Secondary | ICD-10-CM | POA: Diagnosis not present

## 2015-01-10 DIAGNOSIS — M5134 Other intervertebral disc degeneration, thoracic region: Secondary | ICD-10-CM | POA: Diagnosis not present

## 2015-01-11 ENCOUNTER — Ambulatory Visit
Admission: RE | Admit: 2015-01-11 | Discharge: 2015-01-11 | Disposition: A | Discharge status: Home / Self Care | Payer: Medicare Other | Source: Ambulatory Visit | Attending: Family Medicine | Admitting: Family Medicine

## 2015-01-11 DIAGNOSIS — Z0000 Encounter for general adult medical examination without abnormal findings: Secondary | ICD-10-CM

## 2015-01-11 DIAGNOSIS — Z1231 Encounter for screening mammogram for malignant neoplasm of breast: Secondary | ICD-10-CM | POA: Diagnosis present

## 2015-01-11 DIAGNOSIS — R928 Other abnormal and inconclusive findings on diagnostic imaging of breast: Secondary | ICD-10-CM

## 2015-01-11 DIAGNOSIS — R922 Inconclusive mammogram: Secondary | ICD-10-CM | POA: Insufficient documentation

## 2015-01-11 DIAGNOSIS — Z1239 Encounter for other screening for malignant neoplasm of breast: Secondary | ICD-10-CM

## 2015-01-17 DIAGNOSIS — M545 Low back pain: Secondary | ICD-10-CM | POA: Diagnosis not present

## 2015-01-17 DIAGNOSIS — M542 Cervicalgia: Secondary | ICD-10-CM | POA: Diagnosis not present

## 2015-01-17 DIAGNOSIS — M546 Pain in thoracic spine: Secondary | ICD-10-CM | POA: Diagnosis not present

## 2015-01-17 DIAGNOSIS — M9902 Segmental and somatic dysfunction of thoracic region: Secondary | ICD-10-CM | POA: Diagnosis not present

## 2015-01-17 DIAGNOSIS — M9901 Segmental and somatic dysfunction of cervical region: Secondary | ICD-10-CM | POA: Diagnosis not present

## 2015-01-17 DIAGNOSIS — M5030 Other cervical disc degeneration, unspecified cervical region: Secondary | ICD-10-CM | POA: Diagnosis not present

## 2015-01-17 DIAGNOSIS — M5136 Other intervertebral disc degeneration, lumbar region: Secondary | ICD-10-CM | POA: Diagnosis not present

## 2015-01-17 DIAGNOSIS — M9903 Segmental and somatic dysfunction of lumbar region: Secondary | ICD-10-CM | POA: Diagnosis not present

## 2015-01-17 DIAGNOSIS — M5134 Other intervertebral disc degeneration, thoracic region: Secondary | ICD-10-CM | POA: Diagnosis not present

## 2015-01-18 DIAGNOSIS — R928 Other abnormal and inconclusive findings on diagnostic imaging of breast: Secondary | ICD-10-CM | POA: Insufficient documentation

## 2015-01-20 ENCOUNTER — Other Ambulatory Visit: Payer: Self-pay | Admitting: Family Medicine

## 2015-01-20 DIAGNOSIS — R928 Other abnormal and inconclusive findings on diagnostic imaging of breast: Secondary | ICD-10-CM

## 2015-01-20 DIAGNOSIS — N6489 Other specified disorders of breast: Secondary | ICD-10-CM

## 2015-01-26 DIAGNOSIS — M5030 Other cervical disc degeneration, unspecified cervical region: Secondary | ICD-10-CM | POA: Diagnosis not present

## 2015-01-26 DIAGNOSIS — M542 Cervicalgia: Secondary | ICD-10-CM | POA: Diagnosis not present

## 2015-01-26 DIAGNOSIS — M5134 Other intervertebral disc degeneration, thoracic region: Secondary | ICD-10-CM | POA: Diagnosis not present

## 2015-01-26 DIAGNOSIS — M546 Pain in thoracic spine: Secondary | ICD-10-CM | POA: Diagnosis not present

## 2015-01-26 DIAGNOSIS — M9901 Segmental and somatic dysfunction of cervical region: Secondary | ICD-10-CM | POA: Diagnosis not present

## 2015-01-26 DIAGNOSIS — M9902 Segmental and somatic dysfunction of thoracic region: Secondary | ICD-10-CM | POA: Diagnosis not present

## 2015-01-26 DIAGNOSIS — M5136 Other intervertebral disc degeneration, lumbar region: Secondary | ICD-10-CM | POA: Diagnosis not present

## 2015-01-26 DIAGNOSIS — M9903 Segmental and somatic dysfunction of lumbar region: Secondary | ICD-10-CM | POA: Diagnosis not present

## 2015-01-26 DIAGNOSIS — M545 Low back pain: Secondary | ICD-10-CM | POA: Diagnosis not present

## 2015-01-27 DIAGNOSIS — L718 Other rosacea: Secondary | ICD-10-CM | POA: Diagnosis not present

## 2015-02-02 DIAGNOSIS — L719 Rosacea, unspecified: Secondary | ICD-10-CM | POA: Diagnosis not present

## 2015-02-02 DIAGNOSIS — H16223 Keratoconjunctivitis sicca, not specified as Sjogren's, bilateral: Secondary | ICD-10-CM | POA: Diagnosis not present

## 2015-02-04 ENCOUNTER — Ambulatory Visit
Admission: RE | Admit: 2015-02-04 | Discharge: 2015-02-04 | Disposition: A | Discharge status: Home / Self Care | Payer: Medicare Other | Source: Ambulatory Visit | Attending: Family Medicine | Admitting: Family Medicine

## 2015-02-04 DIAGNOSIS — N6489 Other specified disorders of breast: Secondary | ICD-10-CM | POA: Diagnosis not present

## 2015-02-04 DIAGNOSIS — R928 Other abnormal and inconclusive findings on diagnostic imaging of breast: Secondary | ICD-10-CM

## 2015-02-07 DIAGNOSIS — M546 Pain in thoracic spine: Secondary | ICD-10-CM | POA: Diagnosis not present

## 2015-02-07 DIAGNOSIS — M542 Cervicalgia: Secondary | ICD-10-CM | POA: Diagnosis not present

## 2015-02-07 DIAGNOSIS — M545 Low back pain: Secondary | ICD-10-CM | POA: Diagnosis not present

## 2015-02-07 DIAGNOSIS — M5030 Other cervical disc degeneration, unspecified cervical region: Secondary | ICD-10-CM | POA: Diagnosis not present

## 2015-02-07 DIAGNOSIS — M5134 Other intervertebral disc degeneration, thoracic region: Secondary | ICD-10-CM | POA: Diagnosis not present

## 2015-02-07 DIAGNOSIS — M5136 Other intervertebral disc degeneration, lumbar region: Secondary | ICD-10-CM | POA: Diagnosis not present

## 2015-02-07 DIAGNOSIS — M9902 Segmental and somatic dysfunction of thoracic region: Secondary | ICD-10-CM | POA: Diagnosis not present

## 2015-02-07 DIAGNOSIS — M9901 Segmental and somatic dysfunction of cervical region: Secondary | ICD-10-CM | POA: Diagnosis not present

## 2015-02-07 DIAGNOSIS — M9903 Segmental and somatic dysfunction of lumbar region: Secondary | ICD-10-CM | POA: Diagnosis not present

## 2015-02-15 ENCOUNTER — Telehealth: Payer: Self-pay | Admitting: Family Medicine

## 2015-02-15 NOTE — Telephone Encounter (Signed)
02/04/15 radiology report reviewed; next mammo in one year

## 2015-02-15 NOTE — Telephone Encounter (Signed)
-----   Message from Arnetha Courser, MD sent at 01/18/2015  1:35 PM EDT ----- Regarding: Follow-up on abnormal mammogram Make sure additional images were done

## 2015-02-21 DIAGNOSIS — M9902 Segmental and somatic dysfunction of thoracic region: Secondary | ICD-10-CM | POA: Diagnosis not present

## 2015-02-21 DIAGNOSIS — M542 Cervicalgia: Secondary | ICD-10-CM | POA: Diagnosis not present

## 2015-02-21 DIAGNOSIS — M9903 Segmental and somatic dysfunction of lumbar region: Secondary | ICD-10-CM | POA: Diagnosis not present

## 2015-02-21 DIAGNOSIS — M9901 Segmental and somatic dysfunction of cervical region: Secondary | ICD-10-CM | POA: Diagnosis not present

## 2015-02-21 DIAGNOSIS — M546 Pain in thoracic spine: Secondary | ICD-10-CM | POA: Diagnosis not present

## 2015-02-21 DIAGNOSIS — M545 Low back pain: Secondary | ICD-10-CM | POA: Diagnosis not present

## 2015-02-21 DIAGNOSIS — M5134 Other intervertebral disc degeneration, thoracic region: Secondary | ICD-10-CM | POA: Diagnosis not present

## 2015-02-21 DIAGNOSIS — M5030 Other cervical disc degeneration, unspecified cervical region: Secondary | ICD-10-CM | POA: Diagnosis not present

## 2015-02-21 DIAGNOSIS — M5136 Other intervertebral disc degeneration, lumbar region: Secondary | ICD-10-CM | POA: Diagnosis not present

## 2015-03-07 DIAGNOSIS — M545 Low back pain: Secondary | ICD-10-CM | POA: Diagnosis not present

## 2015-03-07 DIAGNOSIS — M9901 Segmental and somatic dysfunction of cervical region: Secondary | ICD-10-CM | POA: Diagnosis not present

## 2015-03-07 DIAGNOSIS — M9903 Segmental and somatic dysfunction of lumbar region: Secondary | ICD-10-CM | POA: Diagnosis not present

## 2015-03-07 DIAGNOSIS — M5136 Other intervertebral disc degeneration, lumbar region: Secondary | ICD-10-CM | POA: Diagnosis not present

## 2015-03-07 DIAGNOSIS — M546 Pain in thoracic spine: Secondary | ICD-10-CM | POA: Diagnosis not present

## 2015-03-07 DIAGNOSIS — M9902 Segmental and somatic dysfunction of thoracic region: Secondary | ICD-10-CM | POA: Diagnosis not present

## 2015-03-07 DIAGNOSIS — M5030 Other cervical disc degeneration, unspecified cervical region: Secondary | ICD-10-CM | POA: Diagnosis not present

## 2015-03-07 DIAGNOSIS — M5134 Other intervertebral disc degeneration, thoracic region: Secondary | ICD-10-CM | POA: Diagnosis not present

## 2015-03-07 DIAGNOSIS — M542 Cervicalgia: Secondary | ICD-10-CM | POA: Diagnosis not present

## 2015-03-28 DIAGNOSIS — M542 Cervicalgia: Secondary | ICD-10-CM | POA: Diagnosis not present

## 2015-03-28 DIAGNOSIS — M545 Low back pain: Secondary | ICD-10-CM | POA: Diagnosis not present

## 2015-03-28 DIAGNOSIS — M5030 Other cervical disc degeneration, unspecified cervical region: Secondary | ICD-10-CM | POA: Diagnosis not present

## 2015-03-28 DIAGNOSIS — M546 Pain in thoracic spine: Secondary | ICD-10-CM | POA: Diagnosis not present

## 2015-03-28 DIAGNOSIS — M9901 Segmental and somatic dysfunction of cervical region: Secondary | ICD-10-CM | POA: Diagnosis not present

## 2015-03-28 DIAGNOSIS — M5136 Other intervertebral disc degeneration, lumbar region: Secondary | ICD-10-CM | POA: Diagnosis not present

## 2015-03-28 DIAGNOSIS — M9903 Segmental and somatic dysfunction of lumbar region: Secondary | ICD-10-CM | POA: Diagnosis not present

## 2015-03-28 DIAGNOSIS — M9902 Segmental and somatic dysfunction of thoracic region: Secondary | ICD-10-CM | POA: Diagnosis not present

## 2015-03-28 DIAGNOSIS — M5134 Other intervertebral disc degeneration, thoracic region: Secondary | ICD-10-CM | POA: Diagnosis not present

## 2015-04-15 DIAGNOSIS — M9903 Segmental and somatic dysfunction of lumbar region: Secondary | ICD-10-CM | POA: Diagnosis not present

## 2015-04-15 DIAGNOSIS — M546 Pain in thoracic spine: Secondary | ICD-10-CM | POA: Diagnosis not present

## 2015-04-15 DIAGNOSIS — M5030 Other cervical disc degeneration, unspecified cervical region: Secondary | ICD-10-CM | POA: Diagnosis not present

## 2015-04-15 DIAGNOSIS — M9901 Segmental and somatic dysfunction of cervical region: Secondary | ICD-10-CM | POA: Diagnosis not present

## 2015-04-15 DIAGNOSIS — M542 Cervicalgia: Secondary | ICD-10-CM | POA: Diagnosis not present

## 2015-04-15 DIAGNOSIS — M545 Low back pain: Secondary | ICD-10-CM | POA: Diagnosis not present

## 2015-04-15 DIAGNOSIS — M5136 Other intervertebral disc degeneration, lumbar region: Secondary | ICD-10-CM | POA: Diagnosis not present

## 2015-04-15 DIAGNOSIS — M5134 Other intervertebral disc degeneration, thoracic region: Secondary | ICD-10-CM | POA: Diagnosis not present

## 2015-04-15 DIAGNOSIS — M9902 Segmental and somatic dysfunction of thoracic region: Secondary | ICD-10-CM | POA: Diagnosis not present

## 2015-04-27 DIAGNOSIS — M9901 Segmental and somatic dysfunction of cervical region: Secondary | ICD-10-CM | POA: Diagnosis not present

## 2015-04-27 DIAGNOSIS — M546 Pain in thoracic spine: Secondary | ICD-10-CM | POA: Diagnosis not present

## 2015-04-27 DIAGNOSIS — M9903 Segmental and somatic dysfunction of lumbar region: Secondary | ICD-10-CM | POA: Diagnosis not present

## 2015-04-27 DIAGNOSIS — M542 Cervicalgia: Secondary | ICD-10-CM | POA: Diagnosis not present

## 2015-04-27 DIAGNOSIS — M5136 Other intervertebral disc degeneration, lumbar region: Secondary | ICD-10-CM | POA: Diagnosis not present

## 2015-04-27 DIAGNOSIS — M5030 Other cervical disc degeneration, unspecified cervical region: Secondary | ICD-10-CM | POA: Diagnosis not present

## 2015-04-27 DIAGNOSIS — M9902 Segmental and somatic dysfunction of thoracic region: Secondary | ICD-10-CM | POA: Diagnosis not present

## 2015-04-27 DIAGNOSIS — M545 Low back pain: Secondary | ICD-10-CM | POA: Diagnosis not present

## 2015-04-27 DIAGNOSIS — M5134 Other intervertebral disc degeneration, thoracic region: Secondary | ICD-10-CM | POA: Diagnosis not present

## 2015-04-30 ENCOUNTER — Telehealth: Payer: Self-pay | Admitting: Family Medicine

## 2015-04-30 NOTE — Telephone Encounter (Signed)
Note received from insurance company; calculated 10 year ASCVD risk; statin suggested I calculate 10.1% 10 year risk I left message at home number, tried cell number as well No worries, just catching up on care management recommendations on a Saturday regarding her cholesterol I'll try her on Monday and recommend a statin

## 2015-05-03 MED ORDER — ASPIRIN EC 81 MG PO TBEC: 81.0000 mg | DELAYED_RELEASE_TABLET | Freq: Every day | ORAL | Status: AC

## 2015-05-03 NOTE — Telephone Encounter (Signed)
I left messages again on home and mobile number; trying to reach her about care management from her insurance company I tried contact number for gentleman, busy signal only I called back to her mobile, left detailed msg; calling about starting statin, please call me back; also, start aspirin 81 mg coated daily for heart protection; we are a team so if she doesn't want statin, call to let me know; if she agrees with starting statin, then call and let me know where to send and we'll follow lipid panel

## 2015-05-05 NOTE — Telephone Encounter (Signed)
I spoke with patient; she'll make an appt to come in and chat about the statin We'll check cholesterol at her appt and recheck the numbers She has started 81 mg aspirin Her eyes were bothering her; Dr. Thomasene Ripple suggested testing for Sjogren's; we'll do that too at her visit

## 2015-05-09 DIAGNOSIS — I10 Essential (primary) hypertension: Secondary | ICD-10-CM | POA: Diagnosis not present

## 2015-05-09 DIAGNOSIS — R0602 Shortness of breath: Secondary | ICD-10-CM | POA: Diagnosis not present

## 2015-05-09 DIAGNOSIS — E782 Mixed hyperlipidemia: Secondary | ICD-10-CM | POA: Diagnosis not present

## 2015-05-09 DIAGNOSIS — K219 Gastro-esophageal reflux disease without esophagitis: Secondary | ICD-10-CM | POA: Diagnosis not present

## 2015-05-25 DIAGNOSIS — M5030 Other cervical disc degeneration, unspecified cervical region: Secondary | ICD-10-CM | POA: Diagnosis not present

## 2015-05-25 DIAGNOSIS — M546 Pain in thoracic spine: Secondary | ICD-10-CM | POA: Diagnosis not present

## 2015-05-25 DIAGNOSIS — M9902 Segmental and somatic dysfunction of thoracic region: Secondary | ICD-10-CM | POA: Diagnosis not present

## 2015-05-25 DIAGNOSIS — M542 Cervicalgia: Secondary | ICD-10-CM | POA: Diagnosis not present

## 2015-05-25 DIAGNOSIS — M5136 Other intervertebral disc degeneration, lumbar region: Secondary | ICD-10-CM | POA: Diagnosis not present

## 2015-05-25 DIAGNOSIS — M5134 Other intervertebral disc degeneration, thoracic region: Secondary | ICD-10-CM | POA: Diagnosis not present

## 2015-05-25 DIAGNOSIS — M9903 Segmental and somatic dysfunction of lumbar region: Secondary | ICD-10-CM | POA: Diagnosis not present

## 2015-05-25 DIAGNOSIS — M9901 Segmental and somatic dysfunction of cervical region: Secondary | ICD-10-CM | POA: Diagnosis not present

## 2015-05-25 DIAGNOSIS — M545 Low back pain: Secondary | ICD-10-CM | POA: Diagnosis not present

## 2015-06-06 ENCOUNTER — Telehealth: Payer: Self-pay | Admitting: Family Medicine

## 2015-06-06 NOTE — Telephone Encounter (Signed)
Patient said no, she wasn't an allergic reaction necessarily--over a year ago she was having weird symptoms and a rash that went into her eye. Was referred to dermatoligst and the said she had rosacea inside her eyelids (occular rasecea) and lost some of her vision. Last night and today it's happening again. Patient has been taking benydryl and that seems to be helping, but the rash is there. She doesn't know if needs a strong allergy medication or if this is an allergic reaction to her Pravastatin or if she needs to be allergy tested.

## 2015-06-06 NOTE — Telephone Encounter (Signed)
It would be helpful to know the name of the medicine and have the reaction updated in her chart please We only have loratidine listed in her med list I'm comfortable managing allergies, but need to know what she reacted to so I don't prescribe it back to her

## 2015-06-06 NOTE — Telephone Encounter (Signed)
She woke up yesterday, nose red on both sides; worse today; doubling up on benadryl; seems to be better; not the same time of year as before She just started pravastatin 3 weeks ago, not sure what the trigger is; tops of ears, little blisters on tops of ears She saw dermatologist last year; on doxy; now has red lines; seems like rosacea coming back out I encouraged she use a HEPA filter; she'll make her own appt with dermatologist

## 2015-06-06 NOTE — Telephone Encounter (Signed)
If her cardiologist gave her that medication, she may have to contact the cardiologist to change.  But still routing to provider.

## 2015-06-06 NOTE — Telephone Encounter (Signed)
Patient said that her cardiology gave her allergy medication and it has given her reaction to it. She wants Dr. Sanda Klein to change it. Please call patient with any questions.

## 2015-06-10 DIAGNOSIS — L718 Other rosacea: Secondary | ICD-10-CM | POA: Diagnosis not present

## 2015-07-25 ENCOUNTER — Other Ambulatory Visit: Payer: Self-pay

## 2015-07-25 DIAGNOSIS — M5134 Other intervertebral disc degeneration, thoracic region: Secondary | ICD-10-CM | POA: Diagnosis not present

## 2015-07-25 DIAGNOSIS — M545 Low back pain: Secondary | ICD-10-CM | POA: Diagnosis not present

## 2015-07-25 DIAGNOSIS — M5136 Other intervertebral disc degeneration, lumbar region: Secondary | ICD-10-CM | POA: Diagnosis not present

## 2015-07-25 DIAGNOSIS — M546 Pain in thoracic spine: Secondary | ICD-10-CM | POA: Diagnosis not present

## 2015-07-25 DIAGNOSIS — M9903 Segmental and somatic dysfunction of lumbar region: Secondary | ICD-10-CM | POA: Diagnosis not present

## 2015-07-25 DIAGNOSIS — M9902 Segmental and somatic dysfunction of thoracic region: Secondary | ICD-10-CM | POA: Diagnosis not present

## 2015-07-25 DIAGNOSIS — M9901 Segmental and somatic dysfunction of cervical region: Secondary | ICD-10-CM | POA: Diagnosis not present

## 2015-07-25 DIAGNOSIS — M5030 Other cervical disc degeneration, unspecified cervical region: Secondary | ICD-10-CM | POA: Diagnosis not present

## 2015-07-25 DIAGNOSIS — M542 Cervicalgia: Secondary | ICD-10-CM | POA: Diagnosis not present

## 2015-07-25 MED ORDER — LEVOTHYROXINE SODIUM 75 MCG PO TABS: 75.0000 ug | ORAL_TABLET | Freq: Every day | ORAL | Status: DC

## 2015-07-25 NOTE — Telephone Encounter (Signed)
I called, left msg; explained I'll be glad to send refill but it's been about a year since last TSH checked; suggested she either have that done with her endocrinologist or with me in the next month

## 2015-07-25 NOTE — Telephone Encounter (Signed)
Routing to provider. She is following you to [facility].

## 2015-08-01 DIAGNOSIS — M9902 Segmental and somatic dysfunction of thoracic region: Secondary | ICD-10-CM | POA: Diagnosis not present

## 2015-08-01 DIAGNOSIS — M5030 Other cervical disc degeneration, unspecified cervical region: Secondary | ICD-10-CM | POA: Diagnosis not present

## 2015-08-01 DIAGNOSIS — M9903 Segmental and somatic dysfunction of lumbar region: Secondary | ICD-10-CM | POA: Diagnosis not present

## 2015-08-01 DIAGNOSIS — M5136 Other intervertebral disc degeneration, lumbar region: Secondary | ICD-10-CM | POA: Diagnosis not present

## 2015-08-01 DIAGNOSIS — M545 Low back pain: Secondary | ICD-10-CM | POA: Diagnosis not present

## 2015-08-01 DIAGNOSIS — M5134 Other intervertebral disc degeneration, thoracic region: Secondary | ICD-10-CM | POA: Diagnosis not present

## 2015-08-01 DIAGNOSIS — M542 Cervicalgia: Secondary | ICD-10-CM | POA: Diagnosis not present

## 2015-08-01 DIAGNOSIS — M9901 Segmental and somatic dysfunction of cervical region: Secondary | ICD-10-CM | POA: Diagnosis not present

## 2015-08-01 DIAGNOSIS — M546 Pain in thoracic spine: Secondary | ICD-10-CM | POA: Diagnosis not present

## 2015-08-02 DIAGNOSIS — B028 Zoster with other complications: Secondary | ICD-10-CM | POA: Diagnosis not present

## 2015-08-03 ENCOUNTER — Ambulatory Visit: Payer: Medicare Other | Admitting: Endocrinology

## 2015-08-03 DIAGNOSIS — L718 Other rosacea: Secondary | ICD-10-CM | POA: Diagnosis not present

## 2015-08-05 ENCOUNTER — Encounter: Payer: Self-pay | Admitting: Family Medicine

## 2015-08-05 ENCOUNTER — Ambulatory Visit (INDEPENDENT_AMBULATORY_CARE_PROVIDER_SITE_OTHER): Payer: Medicare Other | Admitting: Family Medicine

## 2015-08-05 ENCOUNTER — Other Ambulatory Visit: Payer: Self-pay

## 2015-08-05 ENCOUNTER — Telehealth: Payer: Self-pay

## 2015-08-05 VITALS — BP 122/64 | HR 96 | Temp 98.4°F | Resp 12 | Ht <= 58 in | Wt 114.0 lb

## 2015-08-05 DIAGNOSIS — G4733 Obstructive sleep apnea (adult) (pediatric): Secondary | ICD-10-CM | POA: Diagnosis not present

## 2015-08-05 DIAGNOSIS — M5030 Other cervical disc degeneration, unspecified cervical region: Secondary | ICD-10-CM | POA: Diagnosis not present

## 2015-08-05 DIAGNOSIS — R7301 Impaired fasting glucose: Secondary | ICD-10-CM | POA: Diagnosis not present

## 2015-08-05 DIAGNOSIS — E041 Nontoxic single thyroid nodule: Secondary | ICD-10-CM

## 2015-08-05 DIAGNOSIS — Z5181 Encounter for therapeutic drug level monitoring: Secondary | ICD-10-CM | POA: Diagnosis not present

## 2015-08-05 DIAGNOSIS — M545 Low back pain: Secondary | ICD-10-CM | POA: Diagnosis not present

## 2015-08-05 DIAGNOSIS — M9902 Segmental and somatic dysfunction of thoracic region: Secondary | ICD-10-CM | POA: Diagnosis not present

## 2015-08-05 DIAGNOSIS — M542 Cervicalgia: Secondary | ICD-10-CM | POA: Diagnosis not present

## 2015-08-05 DIAGNOSIS — E039 Hypothyroidism, unspecified: Secondary | ICD-10-CM | POA: Diagnosis not present

## 2015-08-05 DIAGNOSIS — M810 Age-related osteoporosis without current pathological fracture: Secondary | ICD-10-CM

## 2015-08-05 DIAGNOSIS — M5134 Other intervertebral disc degeneration, thoracic region: Secondary | ICD-10-CM | POA: Diagnosis not present

## 2015-08-05 DIAGNOSIS — M9903 Segmental and somatic dysfunction of lumbar region: Secondary | ICD-10-CM | POA: Diagnosis not present

## 2015-08-05 DIAGNOSIS — E785 Hyperlipidemia, unspecified: Secondary | ICD-10-CM | POA: Diagnosis not present

## 2015-08-05 DIAGNOSIS — M9901 Segmental and somatic dysfunction of cervical region: Secondary | ICD-10-CM | POA: Diagnosis not present

## 2015-08-05 DIAGNOSIS — M5136 Other intervertebral disc degeneration, lumbar region: Secondary | ICD-10-CM | POA: Diagnosis not present

## 2015-08-05 DIAGNOSIS — M546 Pain in thoracic spine: Secondary | ICD-10-CM | POA: Diagnosis not present

## 2015-08-05 NOTE — Assessment & Plan Note (Signed)
Use to have it worse, not an issue now

## 2015-08-05 NOTE — Telephone Encounter (Signed)
Had to change order states they change it is now ultrasound of thyroid

## 2015-08-05 NOTE — Assessment & Plan Note (Signed)
Check A1c today; limit white bread and sweets; with positive family hx, will check every 6 months

## 2015-08-05 NOTE — Patient Instructions (Addendum)
If nothing obvious on the [place] about the place you feel on your neck, then let me know and we'll get you to the [place] doctor Let's get labs today If you have not heard anything from my staff in a week about any orders/referrals/studies from today, please contact us here to follow-up ([phone] Keep up healthy lifestyle habits Return for Medicare wellness visit when due Return in 6 months for next regular check-up Limit white bread, potatoes, white rice

## 2015-08-05 NOTE — Assessment & Plan Note (Signed)
Repeat US; previously followed by Dr. Howell Rucks

## 2015-08-05 NOTE — Assessment & Plan Note (Signed)
Fall prevention; weight-bearing activities encouraged; managed by rheumatologist

## 2015-08-05 NOTE — Assessment & Plan Note (Signed)
Check lipids today; previous rash to statin; limit saturated fats

## 2015-08-05 NOTE — Assessment & Plan Note (Signed)
We'll check TSH today

## 2015-08-05 NOTE — Progress Notes (Signed)
BP 122/64 mmHg  Pulse 96  Temp(Src) 98.4 F (36.9 C) (Oral)  Resp 12  Ht 4\' 9"  (1.448 m)  Wt 114 lb (51.71 kg)  BMI 24.66 kg/m2  SpO2 97%   Subjective:    Patient ID: Angela Kelly, female    DOB: 10/27/1945, 70 y.o.   MRN: [id number]  HPI: Angela Kelly is a 70 y.o. female  Chief Complaint  Patient presents with  . Follow-up  . Hypothyroidism   She had a terrible reaction to nickel in her glasses; involved nose, bridge, glabella, behind ears; she has new titanium glass frames; anti-itch medicine, benadryl, got better  Hypothyroidism; taking her medicine every morning; bowels are regular; taking probiotics; energy level is good; not feeling jittery or shaky; gaining a little weight, start watching, related to how she's eating; more gluten-free, not so diet-conscious  She started on the cholesterol medicine, then stopped, had a rash and thinks it was from the statin; limits red meats; not big cheese eater; not many eggs  OSA; used to have this bad; not using CPAP anymore; not bad enough she says; keeping allergies controlled is what helps; wakes up refreshed in the morning  Thyroid nodule on the left; previously biopsied; not any larger, she avoid touching that area (was told by endo that touching it would stimulate it to grow), but does feel some swelling above; left side of neck; no pain; no night sweats or unexplained weight loss; no sore throat; nonsmoker  US Soft Tissue Head/Neck   Status: Final result       PACS Images     Show images for US Soft Tissue Head/Neck     Study Result     CLINICAL DATA: 70 year old female with a history of hypothyroidism, left thyroid nodule. Previous left thyroid nodule biopsy.  EXAM: THYROID ULTRASOUND  TECHNIQUE: Ultrasound examination of the thyroid gland and adjacent soft tissues was performed.  COMPARISON: 08/01/2012, 04/03/2011  FINDINGS: Right thyroid lobe  Measurements: 2.9 cm x 1.1 cm x 0.8 cm. No  nodules visualized.  Left thyroid lobe  Measurements: 2.0 cm x 0.5 cm x 0.6 cm. Single left thyroid nodule identified. Nodule measures less than 1 cm.  Isthmus  Thickness: 2 mm. No nodules visualized.  Lymphadenopathy  None visualized.  IMPRESSION: Single left-sided nodule. By record, this nodule has been previously biopsied. Recommend correlation with prior biopsy results.  Findings do not meet current SRU consensus criteria for biopsy. Follow-up by clinical exam is recommended. If patient has known risk factors for thyroid carcinoma, consider follow-up ultrasound in 12 months. If patient is clinically hyperthyroid, consider nuclear medicine thyroid uptake and scan.Reference: Management of Thyroid Nodules Detected at [place]: Society of Radiologists in [place]. Radiology 2005; [id number].  Signed,  Dulcy Fanny. Earleen Newport, DO  [facility]    She has osteoporosis; managed by Dr. Jefm Bryant, rheum  Depression screen [facility] 2/9 08/05/2015 11/04/2014  Decreased Interest 0 0  Down, Depressed, Hopeless 0 0  PHQ - 2 Score 0 0   Relevant past medical, surgical, social history reviewed Past Medical History  Diagnosis Date  . GERD (gastroesophageal reflux disease)   . Arthritis   . Bilateral ovarian cysts     laterality not known  . Osteoporosis   . OSA (obstructive sleep apnea)   . Hyperlipidemia   . Thyroid disease   . IFG (impaired fasting glucose)   . Cataract of both eyes   . Thyroid nodule  left, sees Endocrinology  . Ocular rosacea     see's [facility]  . Rosacea     telengiectatic  . Bunion   . Hammer toe   . Allergy   . History of pneumonia     in her 30's  . Elevated BP   . Shingles     left eye  . Fibrocystic breast   . Vitamin D deficiency disease   . Osteoarthritis cervical spine   . Osteoarthritis of both hands   . Deviated septum   . History of closed fracture  of nasal bones    Past Surgical History  Procedure Laterality Date  . Breast surgery    . Bunionectomy Right   . Back surgery      lumbar laminectomy  . Biopsy thyroid    . Breast cyst aspiration Left    Social History  Substance Use Topics  . Smoking status: Never Smoker   . Smokeless tobacco: Never Used  . Alcohol Use: No     Comment: occasional glass of wine   Interim medical history since our last visit reviewed. Allergies and medications reviewed and updated.  Review of Systems Per HPI unless specifically indicated above     Objective:    BP 122/64 mmHg  Pulse 96  Temp(Src) 98.4 F (36.9 C) (Oral)  Resp 12  Ht 4\' 9"  (1.448 m)  Wt 114 lb (51.71 kg)  BMI 24.66 kg/m2  SpO2 97%  Wt Readings from Last 3 Encounters:  08/05/15 114 lb (51.71 kg)  11/04/14 109 lb (49.442 kg)  07/27/14 108 lb (48.988 kg)    Physical Exam  Constitutional: She appears well-developed and well-nourished. No distress.  Weight gain 5 pounds over last 9 months  HENT:  Head: Normocephalic and atraumatic.  Eyes: EOM are normal. No scleral icterus.  Neck: No tracheal deviation present. Thyroid mass (fullness over the left side of neck above thyroid area, slight visual asymmetry) present. No thyromegaly present.  Cardiovascular: Normal rate, regular rhythm and normal heart sounds.   No murmur heard. Pulmonary/Chest: Effort normal and breath sounds normal. No respiratory distress. She has no wheezes.  Abdominal: Soft. Bowel sounds are normal. She exhibits no distension.  Musculoskeletal: Normal range of motion. She exhibits no edema.  Neurological: She is alert. She exhibits normal muscle tone.  Skin: Skin is warm and dry. She is not diaphoretic. No pallor.  Psychiatric: She has a normal mood and affect. Her behavior is normal. Judgment and thought content normal.      Assessment & Plan:   Problem List Items Addressed This Visit      Respiratory   OSA (obstructive sleep apnea)    Use to  have it worse, not an issue now        Endocrine   Hypothyroidism    We'll check TSH today      Relevant Orders   TSH (Completed)   T3, free (Completed)   T4, free (Completed)   Left thyroid nodule    Repeat US; previously followed by Dr. Howell Rucks      Relevant Orders   US Soft Tissue Head/Neck   IFG (impaired fasting glucose) - Primary    Check A1c today; limit white bread and sweets; with positive family hx, will check every 6 months      Relevant Orders   Hgb A1c w/o eAG (Completed)     Musculoskeletal and Integument   Osteoporosis    Fall prevention; weight-bearing activities encouraged; managed by  rheumatologist        Other   Hyperlipidemia    Check lipids today; previous rash to statin; limit saturated fats      Relevant Orders   Lipid Panel w/o Chol/HDL Ratio (Completed)   Medication monitoring encounter   Relevant Orders   Comprehensive metabolic panel (Completed)      Follow up plan: Return in about 6 months (around 02/04/2016) for prediabetes and fasting labs; Medicare Wellness when due.  An after-visit summary was printed and given to the patient at [place].  Please see the patient instructions which may contain other information and recommendations beyond what is mentioned above in the assessment and plan.  Orders Placed This Encounter  Procedures  . US Soft Tissue Head/Neck  . Lipid Panel w/o Chol/HDL Ratio  . Hgb A1c w/o eAG  . Comprehensive metabolic panel  . TSH  . T3, free  . T4, free

## 2015-08-06 LAB — COMPREHENSIVE METABOLIC PANEL
ALT: 20 IU/L (ref 0–32)
AST: 28 IU/L (ref 0–40)
Albumin/Globulin Ratio: 1.6 (ref 1.2–2.2)
Albumin: 4.1 g/dL (ref 3.5–4.8)
Alkaline Phosphatase: 105 IU/L (ref 39–117)
BUN/Creatinine Ratio: 15 (ref 12–28)
BUN: 14 mg/dL (ref 8–27)
Bilirubin Total: 0.3 mg/dL (ref 0.0–1.2)
CO2: 27 mmol/L (ref 18–29)
Calcium: 9.5 mg/dL (ref 8.7–10.3)
Chloride: 103 mmol/L (ref 96–106)
Creatinine, Ser: 0.94 mg/dL (ref 0.57–1.00)
GFR calc Af Amer: 70 mL/min/{1.73_m2} (ref >59)
GFR calc non Af Amer: 61 mL/min/{1.73_m2} (ref >59)
Globulin, Total: 2.6 g/dL (ref 1.5–4.5)
Glucose: 88 mg/dL (ref 65–99)
Potassium: 5.8 mmol/L — ABNORMAL HIGH (ref 3.5–5.2)
Sodium: 144 mmol/L (ref 134–144)
Total Protein: 6.7 g/dL (ref 6.0–8.5)

## 2015-08-06 LAB — HGB A1C W/O EAG: Hgb A1c MFr Bld: 5.7 % — ABNORMAL HIGH (ref 4.8–5.6)

## 2015-08-06 LAB — LIPID PANEL W/O CHOL/HDL RATIO
Cholesterol, Total: 269 mg/dL — ABNORMAL HIGH (ref 100–199)
HDL: 70 mg/dL (ref >39)
LDL Calculated: 166 mg/dL — ABNORMAL HIGH (ref 0–99)
Triglycerides: 163 mg/dL — ABNORMAL HIGH (ref 0–149)
VLDL Cholesterol Cal: 33 mg/dL (ref 5–40)

## 2015-08-06 LAB — T3, FREE: T3, Free: 2.7 pg/mL (ref 2.0–4.4)

## 2015-08-06 LAB — T4, FREE: Free T4: 1.07 ng/dL (ref 0.82–1.77)

## 2015-08-06 LAB — TSH: TSH: 1.97 u[IU]/mL (ref 0.450–4.500)

## 2015-08-07 ENCOUNTER — Other Ambulatory Visit: Payer: Self-pay | Admitting: Family Medicine

## 2015-08-07 DIAGNOSIS — E875 Hyperkalemia: Secondary | ICD-10-CM | POA: Insufficient documentation

## 2015-08-07 NOTE — Telephone Encounter (Signed)
okay

## 2015-08-08 ENCOUNTER — Other Ambulatory Visit
Admission: RE | Admit: 2015-08-08 | Discharge: 2015-08-08 | Disposition: A | Discharge status: Home / Self Care | Payer: Medicare Other | Source: Ambulatory Visit | Attending: Family Medicine | Admitting: Family Medicine

## 2015-08-08 DIAGNOSIS — M9903 Segmental and somatic dysfunction of lumbar region: Secondary | ICD-10-CM | POA: Diagnosis not present

## 2015-08-08 DIAGNOSIS — M9901 Segmental and somatic dysfunction of cervical region: Secondary | ICD-10-CM | POA: Diagnosis not present

## 2015-08-08 DIAGNOSIS — M5030 Other cervical disc degeneration, unspecified cervical region: Secondary | ICD-10-CM | POA: Diagnosis not present

## 2015-08-08 DIAGNOSIS — M5134 Other intervertebral disc degeneration, thoracic region: Secondary | ICD-10-CM | POA: Diagnosis not present

## 2015-08-08 DIAGNOSIS — M9902 Segmental and somatic dysfunction of thoracic region: Secondary | ICD-10-CM | POA: Diagnosis not present

## 2015-08-08 DIAGNOSIS — M5136 Other intervertebral disc degeneration, lumbar region: Secondary | ICD-10-CM | POA: Diagnosis not present

## 2015-08-08 DIAGNOSIS — E875 Hyperkalemia: Secondary | ICD-10-CM | POA: Insufficient documentation

## 2015-08-08 DIAGNOSIS — M546 Pain in thoracic spine: Secondary | ICD-10-CM | POA: Diagnosis not present

## 2015-08-08 DIAGNOSIS — M542 Cervicalgia: Secondary | ICD-10-CM | POA: Diagnosis not present

## 2015-08-08 DIAGNOSIS — M545 Low back pain: Secondary | ICD-10-CM | POA: Diagnosis not present

## 2015-08-08 LAB — POTASSIUM: Potassium: 4.9 mmol/L (ref 3.5–5.1)

## 2015-08-10 ENCOUNTER — Ambulatory Visit
Admission: RE | Admit: 2015-08-10 | Discharge: 2015-08-10 | Disposition: A | Discharge status: Home / Self Care | Payer: Medicare Other | Source: Ambulatory Visit | Attending: Family Medicine | Admitting: Family Medicine

## 2015-08-10 DIAGNOSIS — E041 Nontoxic single thyroid nodule: Secondary | ICD-10-CM | POA: Insufficient documentation

## 2015-08-12 DIAGNOSIS — M9902 Segmental and somatic dysfunction of thoracic region: Secondary | ICD-10-CM | POA: Diagnosis not present

## 2015-08-12 DIAGNOSIS — M5134 Other intervertebral disc degeneration, thoracic region: Secondary | ICD-10-CM | POA: Diagnosis not present

## 2015-08-12 DIAGNOSIS — M5030 Other cervical disc degeneration, unspecified cervical region: Secondary | ICD-10-CM | POA: Diagnosis not present

## 2015-08-12 DIAGNOSIS — M542 Cervicalgia: Secondary | ICD-10-CM | POA: Diagnosis not present

## 2015-08-12 DIAGNOSIS — M545 Low back pain: Secondary | ICD-10-CM | POA: Diagnosis not present

## 2015-08-12 DIAGNOSIS — M5136 Other intervertebral disc degeneration, lumbar region: Secondary | ICD-10-CM | POA: Diagnosis not present

## 2015-08-12 DIAGNOSIS — M546 Pain in thoracic spine: Secondary | ICD-10-CM | POA: Diagnosis not present

## 2015-08-12 DIAGNOSIS — M9903 Segmental and somatic dysfunction of lumbar region: Secondary | ICD-10-CM | POA: Diagnosis not present

## 2015-08-12 DIAGNOSIS — M9901 Segmental and somatic dysfunction of cervical region: Secondary | ICD-10-CM | POA: Diagnosis not present

## 2015-08-16 ENCOUNTER — Telehealth: Payer: Self-pay | Admitting: Family Medicine

## 2015-08-16 MED ORDER — EZETIMIBE 10 MG PO TABS: 10.0000 mg | ORAL_TABLET | Freq: Every day | ORAL | Status: DC

## 2015-08-16 NOTE — Telephone Encounter (Signed)
It was discussed if patient would like to try zetia and she would. If possible please send the generic brand (ezetimbe) to [facility]

## 2015-08-16 NOTE — Telephone Encounter (Signed)
rx sent

## 2015-08-19 DIAGNOSIS — M545 Low back pain: Secondary | ICD-10-CM | POA: Diagnosis not present

## 2015-08-19 DIAGNOSIS — M546 Pain in thoracic spine: Secondary | ICD-10-CM | POA: Diagnosis not present

## 2015-08-19 DIAGNOSIS — M9903 Segmental and somatic dysfunction of lumbar region: Secondary | ICD-10-CM | POA: Diagnosis not present

## 2015-08-19 DIAGNOSIS — M9901 Segmental and somatic dysfunction of cervical region: Secondary | ICD-10-CM | POA: Diagnosis not present

## 2015-08-19 DIAGNOSIS — M5136 Other intervertebral disc degeneration, lumbar region: Secondary | ICD-10-CM | POA: Diagnosis not present

## 2015-08-19 DIAGNOSIS — M542 Cervicalgia: Secondary | ICD-10-CM | POA: Diagnosis not present

## 2015-08-19 DIAGNOSIS — M5134 Other intervertebral disc degeneration, thoracic region: Secondary | ICD-10-CM | POA: Diagnosis not present

## 2015-08-19 DIAGNOSIS — M5030 Other cervical disc degeneration, unspecified cervical region: Secondary | ICD-10-CM | POA: Diagnosis not present

## 2015-08-19 DIAGNOSIS — M9902 Segmental and somatic dysfunction of thoracic region: Secondary | ICD-10-CM | POA: Diagnosis not present

## 2015-08-22 DIAGNOSIS — M545 Low back pain: Secondary | ICD-10-CM | POA: Diagnosis not present

## 2015-08-22 DIAGNOSIS — M9902 Segmental and somatic dysfunction of thoracic region: Secondary | ICD-10-CM | POA: Diagnosis not present

## 2015-08-22 DIAGNOSIS — M9901 Segmental and somatic dysfunction of cervical region: Secondary | ICD-10-CM | POA: Diagnosis not present

## 2015-08-22 DIAGNOSIS — M546 Pain in thoracic spine: Secondary | ICD-10-CM | POA: Diagnosis not present

## 2015-08-22 DIAGNOSIS — M542 Cervicalgia: Secondary | ICD-10-CM | POA: Diagnosis not present

## 2015-08-22 DIAGNOSIS — M5134 Other intervertebral disc degeneration, thoracic region: Secondary | ICD-10-CM | POA: Diagnosis not present

## 2015-08-22 DIAGNOSIS — M5136 Other intervertebral disc degeneration, lumbar region: Secondary | ICD-10-CM | POA: Diagnosis not present

## 2015-08-22 DIAGNOSIS — M5030 Other cervical disc degeneration, unspecified cervical region: Secondary | ICD-10-CM | POA: Diagnosis not present

## 2015-08-22 DIAGNOSIS — M9903 Segmental and somatic dysfunction of lumbar region: Secondary | ICD-10-CM | POA: Diagnosis not present

## 2015-08-25 ENCOUNTER — Other Ambulatory Visit: Payer: Self-pay

## 2015-08-25 MED ORDER — LEVOTHYROXINE SODIUM 75 MCG PO TABS: 75.0000 ug | ORAL_TABLET | Freq: Every day | ORAL | Status: DC

## 2015-08-25 NOTE — Telephone Encounter (Signed)
Thyroid tests from August 05, 2015 reviewed; Rx approved

## 2015-08-25 NOTE — Telephone Encounter (Signed)
Routing to provider  

## 2015-08-26 DIAGNOSIS — M9903 Segmental and somatic dysfunction of lumbar region: Secondary | ICD-10-CM | POA: Diagnosis not present

## 2015-08-26 DIAGNOSIS — M545 Low back pain: Secondary | ICD-10-CM | POA: Diagnosis not present

## 2015-08-26 DIAGNOSIS — M5136 Other intervertebral disc degeneration, lumbar region: Secondary | ICD-10-CM | POA: Diagnosis not present

## 2015-08-26 DIAGNOSIS — M546 Pain in thoracic spine: Secondary | ICD-10-CM | POA: Diagnosis not present

## 2015-08-26 DIAGNOSIS — M5134 Other intervertebral disc degeneration, thoracic region: Secondary | ICD-10-CM | POA: Diagnosis not present

## 2015-08-26 DIAGNOSIS — M9902 Segmental and somatic dysfunction of thoracic region: Secondary | ICD-10-CM | POA: Diagnosis not present

## 2015-08-26 DIAGNOSIS — M542 Cervicalgia: Secondary | ICD-10-CM | POA: Diagnosis not present

## 2015-08-26 DIAGNOSIS — M5030 Other cervical disc degeneration, unspecified cervical region: Secondary | ICD-10-CM | POA: Diagnosis not present

## 2015-08-26 DIAGNOSIS — M9901 Segmental and somatic dysfunction of cervical region: Secondary | ICD-10-CM | POA: Diagnosis not present

## 2015-08-29 DIAGNOSIS — M5030 Other cervical disc degeneration, unspecified cervical region: Secondary | ICD-10-CM | POA: Diagnosis not present

## 2015-08-29 DIAGNOSIS — M9901 Segmental and somatic dysfunction of cervical region: Secondary | ICD-10-CM | POA: Diagnosis not present

## 2015-08-29 DIAGNOSIS — M546 Pain in thoracic spine: Secondary | ICD-10-CM | POA: Diagnosis not present

## 2015-08-29 DIAGNOSIS — M9903 Segmental and somatic dysfunction of lumbar region: Secondary | ICD-10-CM | POA: Diagnosis not present

## 2015-08-29 DIAGNOSIS — M5134 Other intervertebral disc degeneration, thoracic region: Secondary | ICD-10-CM | POA: Diagnosis not present

## 2015-08-29 DIAGNOSIS — M542 Cervicalgia: Secondary | ICD-10-CM | POA: Diagnosis not present

## 2015-08-29 DIAGNOSIS — M5136 Other intervertebral disc degeneration, lumbar region: Secondary | ICD-10-CM | POA: Diagnosis not present

## 2015-08-29 DIAGNOSIS — M545 Low back pain: Secondary | ICD-10-CM | POA: Diagnosis not present

## 2015-08-29 DIAGNOSIS — M9902 Segmental and somatic dysfunction of thoracic region: Secondary | ICD-10-CM | POA: Diagnosis not present

## 2015-09-02 DIAGNOSIS — M5136 Other intervertebral disc degeneration, lumbar region: Secondary | ICD-10-CM | POA: Diagnosis not present

## 2015-09-02 DIAGNOSIS — M9903 Segmental and somatic dysfunction of lumbar region: Secondary | ICD-10-CM | POA: Diagnosis not present

## 2015-09-02 DIAGNOSIS — M9901 Segmental and somatic dysfunction of cervical region: Secondary | ICD-10-CM | POA: Diagnosis not present

## 2015-09-02 DIAGNOSIS — M546 Pain in thoracic spine: Secondary | ICD-10-CM | POA: Diagnosis not present

## 2015-09-02 DIAGNOSIS — M5134 Other intervertebral disc degeneration, thoracic region: Secondary | ICD-10-CM | POA: Diagnosis not present

## 2015-09-02 DIAGNOSIS — M545 Low back pain: Secondary | ICD-10-CM | POA: Diagnosis not present

## 2015-09-02 DIAGNOSIS — M9902 Segmental and somatic dysfunction of thoracic region: Secondary | ICD-10-CM | POA: Diagnosis not present

## 2015-09-02 DIAGNOSIS — M542 Cervicalgia: Secondary | ICD-10-CM | POA: Diagnosis not present

## 2015-09-02 DIAGNOSIS — M5030 Other cervical disc degeneration, unspecified cervical region: Secondary | ICD-10-CM | POA: Diagnosis not present

## 2015-09-05 DIAGNOSIS — M9902 Segmental and somatic dysfunction of thoracic region: Secondary | ICD-10-CM | POA: Diagnosis not present

## 2015-09-05 DIAGNOSIS — M5030 Other cervical disc degeneration, unspecified cervical region: Secondary | ICD-10-CM | POA: Diagnosis not present

## 2015-09-05 DIAGNOSIS — M542 Cervicalgia: Secondary | ICD-10-CM | POA: Diagnosis not present

## 2015-09-05 DIAGNOSIS — M9903 Segmental and somatic dysfunction of lumbar region: Secondary | ICD-10-CM | POA: Diagnosis not present

## 2015-09-05 DIAGNOSIS — M5136 Other intervertebral disc degeneration, lumbar region: Secondary | ICD-10-CM | POA: Diagnosis not present

## 2015-09-05 DIAGNOSIS — M9901 Segmental and somatic dysfunction of cervical region: Secondary | ICD-10-CM | POA: Diagnosis not present

## 2015-09-05 DIAGNOSIS — M545 Low back pain: Secondary | ICD-10-CM | POA: Diagnosis not present

## 2015-09-05 DIAGNOSIS — M5134 Other intervertebral disc degeneration, thoracic region: Secondary | ICD-10-CM | POA: Diagnosis not present

## 2015-09-05 DIAGNOSIS — M546 Pain in thoracic spine: Secondary | ICD-10-CM | POA: Diagnosis not present

## 2015-09-06 ENCOUNTER — Other Ambulatory Visit: Payer: Self-pay | Admitting: Family Medicine

## 2015-10-24 ENCOUNTER — Telehealth: Payer: Self-pay | Admitting: Family Medicine

## 2015-10-24 DIAGNOSIS — R7301 Impaired fasting glucose: Secondary | ICD-10-CM

## 2015-10-24 DIAGNOSIS — E785 Hyperlipidemia, unspecified: Secondary | ICD-10-CM

## 2015-10-24 NOTE — Assessment & Plan Note (Signed)
Check A1c on or after November 04, 2015, along with fasting glucose

## 2015-10-24 NOTE — Assessment & Plan Note (Signed)
Check fasting lipids 

## 2015-10-24 NOTE — Telephone Encounter (Signed)
I reviewed her last A1c and glucose; they were not quite 3 months ago; she is welcome to come in for a POCT fasting glucose this week with you (come get me if over 150) I'd suggest waiting for the A1c and cholesterol blood work until on or after July 21st; I don't think her insurance will pay for an A1c or cholesterol if it's been less than 3 months since the last one, but if symptoms severe, just get them now If rapid weight loss, blurred vision, urinary frequency, extreme thirst, fatigue, etc., then by all means get the labs prior to July 21st and schedule visit I've ordered a BMP (which includes glucose), A1c, and lipids to be done July 21st (Friday) ideally (now if severe sx) so we'll have results for her visit Thank you

## 2015-10-24 NOTE — Telephone Encounter (Signed)
Pt has concerns she may be a diabetic. She has family history and she is now having symptoms. Pt has an appt on 11/07/15 for CPE. Wants to know if labs can be ordered for her until she comes in. Please advise

## 2015-10-25 ENCOUNTER — Encounter: Payer: Self-pay | Admitting: Emergency Medicine

## 2015-10-25 ENCOUNTER — Emergency Department
Admission: EM | Admit: 2015-10-25 | Discharge: 2015-10-25 | Disposition: A | Discharge status: Home / Self Care | Payer: Medicare Other | Attending: Emergency Medicine | Admitting: Emergency Medicine

## 2015-10-25 ENCOUNTER — Emergency Department: Payer: Medicare Other

## 2015-10-25 DIAGNOSIS — Z7982 Long term (current) use of aspirin: Secondary | ICD-10-CM | POA: Insufficient documentation

## 2015-10-25 DIAGNOSIS — E785 Hyperlipidemia, unspecified: Secondary | ICD-10-CM | POA: Diagnosis not present

## 2015-10-25 DIAGNOSIS — M810 Age-related osteoporosis without current pathological fracture: Secondary | ICD-10-CM | POA: Diagnosis not present

## 2015-10-25 DIAGNOSIS — M79601 Pain in right arm: Secondary | ICD-10-CM | POA: Insufficient documentation

## 2015-10-25 DIAGNOSIS — M19042 Primary osteoarthritis, left hand: Secondary | ICD-10-CM | POA: Diagnosis not present

## 2015-10-25 DIAGNOSIS — R0789 Other chest pain: Secondary | ICD-10-CM | POA: Insufficient documentation

## 2015-10-25 DIAGNOSIS — R61 Generalized hyperhidrosis: Secondary | ICD-10-CM | POA: Insufficient documentation

## 2015-10-25 DIAGNOSIS — R110 Nausea: Secondary | ICD-10-CM | POA: Diagnosis not present

## 2015-10-25 DIAGNOSIS — E039 Hypothyroidism, unspecified: Secondary | ICD-10-CM | POA: Diagnosis not present

## 2015-10-25 DIAGNOSIS — M19041 Primary osteoarthritis, right hand: Secondary | ICD-10-CM | POA: Insufficient documentation

## 2015-10-25 DIAGNOSIS — M47812 Spondylosis without myelopathy or radiculopathy, cervical region: Secondary | ICD-10-CM | POA: Insufficient documentation

## 2015-10-25 DIAGNOSIS — R079 Chest pain, unspecified: Secondary | ICD-10-CM | POA: Diagnosis not present

## 2015-10-25 LAB — CBC
HCT: 40.4 % (ref 35.0–47.0)
Hemoglobin: 13.9 g/dL (ref 12.0–16.0)
MCH: 29.5 pg (ref 26.0–34.0)
MCHC: 34.5 g/dL (ref 32.0–36.0)
MCV: 85.4 fL (ref 80.0–100.0)
Platelets: 325 10*3/uL (ref 150–440)
RBC: 4.73 MIL/uL (ref 3.80–5.20)
RDW: 13.6 % (ref 11.5–14.5)
WBC: 8.6 10*3/uL (ref 3.6–11.0)

## 2015-10-25 LAB — URINALYSIS COMPLETE WITH MICROSCOPIC (ARMC ONLY)
Bacteria, UA: NONE SEEN
Bilirubin Urine: NEGATIVE
Glucose, UA: NEGATIVE mg/dL
Hgb urine dipstick: NEGATIVE
Ketones, ur: NEGATIVE mg/dL
Leukocytes, UA: NEGATIVE
Nitrite: NEGATIVE
Protein, ur: NEGATIVE mg/dL
RBC / HPF: NONE SEEN RBC/hpf (ref 0–5)
Specific Gravity, Urine: 1.003 — ABNORMAL LOW (ref 1.005–1.030)
Squamous Epithelial / LPF: NONE SEEN
WBC, UA: NONE SEEN WBC/hpf (ref 0–5)
pH: 6 (ref 5.0–8.0)

## 2015-10-25 LAB — TROPONIN I: Troponin I: <0.03 ng/mL (ref <0.03)

## 2015-10-25 LAB — COMPREHENSIVE METABOLIC PANEL
ALT: 17 U/L (ref 14–54)
AST: 22 U/L (ref 15–41)
Albumin: 3.8 g/dL (ref 3.5–5.0)
Alkaline Phosphatase: 93 U/L (ref 38–126)
Anion gap: 3 — ABNORMAL LOW (ref 5–15)
BUN: 9 mg/dL (ref 6–20)
CO2: 32 mmol/L (ref 22–32)
Calcium: 9.5 mg/dL (ref 8.9–10.3)
Chloride: 104 mmol/L (ref 101–111)
Creatinine, Ser: 0.83 mg/dL (ref 0.44–1.00)
GFR calc Af Amer: >60 mL/min (ref >60)
GFR calc non Af Amer: >60 mL/min (ref >60)
Glucose, Bld: 116 mg/dL — ABNORMAL HIGH (ref 65–99)
Potassium: 4.6 mmol/L (ref 3.5–5.1)
Sodium: 139 mmol/L (ref 135–145)
Total Bilirubin: 0.3 mg/dL (ref 0.3–1.2)
Total Protein: 6.5 g/dL (ref 6.5–8.1)

## 2015-10-25 LAB — TSH: TSH: 1.146 u[IU]/mL (ref 0.350–4.500)

## 2015-10-25 LAB — LIPASE, BLOOD: Lipase: 28 U/L (ref 11–51)

## 2015-10-25 MED ORDER — ONDANSETRON HCL 4 MG PO TABS: 4.0000 mg | ORAL_TABLET | Freq: Three times a day (TID) | ORAL | Status: DC | PRN

## 2015-10-25 NOTE — Discharge Instructions (Signed)
Please return to the emergency department for fever, inability to keep down fluids, pain, shortness of breath, or any other symptoms concerning to you.  The cardiology clinic will call you for an outpatient stress test appointment. If they do not call you within 24 hours, please call the appointment line to make an appointment.

## 2015-10-25 NOTE — Telephone Encounter (Signed)
Talked with patient states she called call a nurse last night and was having nausea as well as sob with other symptoms.  She was told to see PCP within 24 hours due to her symptoms and no availbility I told her we would recommend her go to urgent care or ER to be evaluated.

## 2015-10-25 NOTE — ED Provider Notes (Signed)
[facility] Emergency Department Provider Note  ____________________________________________  Time seen: Approximately 8:16 PM  I have reviewed the triage vital signs and the nursing notes.   HISTORY  Chief Complaint Night Sweats; Generalized Body Aches; and Nausea    HPI Angela Kelly is a 70 y.o. female with a history of thyroid disease, HTN, GERD, HL presenting with diaphoresis, chest burning, right arm pain, nausea without vomiting. The patient reports that 4 nights ago she woke up and was feeling diaphoretic and hot. Since then she has had multiple daily episodes where she has a burning sensation in the center of her chest that does not radiate and is not associated with palpitations, lightheadedness or syncope. This chest pain is nonexertional or pleuritic. Unrelated to her chest discomfort, she also has a bandlike discomfort around the right upper arm. In addition, she has waves of nausea without vomiting and a clammy sensation. She denies any recent fever, chills, changes in her medications. She has a history of a remote stress test but has not had one in the last 12 months.   Past Medical History  Diagnosis Date  . GERD (gastroesophageal reflux disease)   . Arthritis   . Bilateral ovarian cysts     laterality not known  . Osteoporosis   . OSA (obstructive sleep apnea)   . Hyperlipidemia   . Thyroid disease   . IFG (impaired fasting glucose)   . Cataract of both eyes   . Thyroid nodule     left, sees Endocrinology  . Ocular rosacea     see's [facility]  . Rosacea     telengiectatic  . Bunion   . Hammer toe   . Allergy   . History of pneumonia     in her 42's  . Elevated BP   . Shingles     left eye  . Fibrocystic breast   . Vitamin D deficiency disease   . Osteoarthritis cervical spine   . Osteoarthritis of both hands   . Deviated septum   . History of closed fracture of nasal bones     Patient Active Problem List   Diagnosis Date Noted  . Hyperkalemia 08/07/2015  . Medication monitoring encounter 08/05/2015  . Abnormal mammogram of left breast 01/18/2015  . OSA (obstructive sleep apnea)   . Hyperlipidemia   . Thyroid disease   . Cataract of both eyes   . Ocular rosacea   . Bunion   . Hammer toe   . Allergy   . Hypothyroidism 08/03/2014  . Left thyroid nodule 08/03/2014  . IFG (impaired fasting glucose) 08/03/2014  . Osteoporosis 08/03/2014    Past Surgical History  Procedure Laterality Date  . Breast surgery    . Bunionectomy Right   . Back surgery      lumbar laminectomy  . Biopsy thyroid    . Breast cyst aspiration Left     Current Outpatient Rx  Name  Route  Sig  Dispense  Refill  . aspirin EC 81 MG tablet   Oral   Take 1 tablet (81 mg total) by mouth daily.   30 tablet   11   . B Complex-C (SUPER B COMPLEX PO)   Oral   Take 1 capsule by mouth daily.         . beta carotene w/minerals (OCUVITE) tablet   Oral   Take 1 tablet by mouth daily.         . Biotin 5000 MCG  CAPS   Oral   Take 1 capsule by mouth daily.         . calcium carbonate (OS-CAL) 600 MG TABS tablet   Oral   Take 600 mg by mouth 2 (two) times daily with a meal.         . cholecalciferol (VITAMIN D) 1000 UNITS tablet   Oral   Take 1,000 Units by mouth daily.         . ciclesonide (OMNARIS) 50 MCG/ACT nasal spray   Each Nare   Place 2 sprays into both nostrils daily.         . diphenhydrAMINE (BENADRYL) 25 mg capsule   Oral   Take 25 mg by mouth every 4 (four) hours as needed.         . ezetimibe (ZETIA) 10 MG tablet   Oral   Take 1 tablet (10 mg total) by mouth daily.   30 tablet   5     Generic please   . Ivermectin (SOOLANTRA) 1 % CREA   Apply externally   Apply topically.         Marland Kitchen levothyroxine (SYNTHROID, LEVOTHROID) 75 MCG tablet   Oral   Take 1 tablet (75 mcg total) by mouth daily.   30 tablet   11   . Lysine 1000 MG TABS   Oral   Take 1 tablet by mouth  as needed.          . mometasone (NASONEX) 50 MCG/ACT nasal spray      INSTILL 2 SPRAYS INTO EACH NOSTRIL DAILY   17 g   11   . Multiple Vitamin (MULTIVITAMIN) capsule   Oral   Take 1 capsule by mouth daily.         . Omega-3 Fatty Acids (FISH OIL PO)   Oral   Take 2 capsules by mouth daily.         . ondansetron (ZOFRAN) 4 MG tablet   Oral   Take 1 tablet (4 mg total) by mouth every 8 (eight) hours as needed for nausea or vomiting.   15 tablet   0   . PAZEO 0.7 % SOLN   Both Eyes   Place 1 drop into both eyes daily.      5     Dispense as written.   . Potassium Gluconate 595 MG CAPS   Oral   Take 1 capsule by mouth daily.         . Probiotic Product (PROBIOTIC DAILY PO)   Oral   Take 1 capsule by mouth 2 (two) times daily.         Robb Matar 1 % CREA   Topical   Apply topically daily. to face      0     Dispense as written.   . vitamin B-12 (CYANOCOBALAMIN) 100 MCG tablet   Oral   Take 100 mcg by mouth daily.         . vitamin C (ASCORBIC ACID) 500 MG tablet   Oral   Take 500 mg by mouth daily.         . vitamin E 400 UNIT capsule   Oral   Take 400 Units by mouth daily.           Allergies Codeine; Lovastatin; Nickel; and Tape  Family History  Problem Relation Age of Onset  . Asthma Mother   . Hypertension Mother   . Diabetes Mother   . Cancer Mother  Leukemia  . Peripheral vascular disease Mother   . Osteoporosis Mother   . Glaucoma Mother   . Heart disease Father   . Early death Father   . Heart attack Father   . Hypertension Sister   . Diabetes Sister   . Diabetes Maternal Grandmother   . Colon cancer Maternal Uncle   . Lung cancer Paternal Uncle     Social History Social History  Substance Use Topics  . Smoking status: Never Smoker   . Smokeless tobacco: Never Used  . Alcohol Use: No     Comment: occasional glass of wine    Review of Systems Constitutional: No fever/chills.Positive general malaise.  Positive diaphoresis. Eyes: No visual changes. ENT: No sore throat. No congestion or rhinorrhea. Cardiovascular: Positive central chest pain. Denies palpitations. Respiratory: Denies shortness of breath.  No cough. Gastrointestinal: No abdominal pain.  Positive nausea, no vomiting.  No diarrhea.  No constipation. Genitourinary: Negative for dysuria. Positive frequent urination which is unchanged. Musculoskeletal: Negative for back pain. Skin: Negative for rash. Neurological: Negative for headaches. No focal numbness, tingling or weakness.   10-point ROS otherwise negative.  ____________________________________________   PHYSICAL EXAM:  VITAL SIGNS: ED Triage Vitals  Enc Vitals Group     BP 10/25/15 1752 124/82 mmHg     Pulse Rate 10/25/15 1752 92     Resp 10/25/15 1752 18     Temp 10/25/15 1752 98.2 F (36.8 C)     Temp Source 10/25/15 1752 Oral     SpO2 10/25/15 1752 99 %     Weight 10/25/15 1752 111 lb (50.349 kg)     Height 10/25/15 1752 4\' 10"  (1.473 m)     Head Cir --      Peak Flow --      Pain Score 10/25/15 1909 3     Pain Loc --      Pain Edu? --      Excl. in [place]? --     Constitutional: Alert and oriented. Well appearing and in no acute distress. Answers questions appropriately. Eyes: Conjunctivae are normal.  EOMI. No scleral icterus. Head: Atraumatic. Nose: No congestion/rhinnorhea. Mouth/Throat: Mucous membranes are moist.  Neck: No stridor.  Supple.  No JVD. No meningismus. Cardiovascular: Normal rate, regular rhythm. No murmurs, rubs or gallops.  Respiratory: Normal respiratory effort.  No accessory muscle use or retractions. Lungs CTAB.  No wheezes, rales or ronchi. Gastrointestinal: Soft, nontender and nondistended.  No guarding or rebound.  No peritoneal signs. Musculoskeletal: No LE edema. No ttp in the calves or palpable cords.  Negative Homan's sign. Neurologic:  A&Ox3.  Speech is clear.  Face and smile are symmetric.  EOMI.  Moves all extremities  well. Skin:  Skin is warm, dry and intact. No rash noted. Psychiatric: Mood and affect are normal. Speech and behavior are normal.  Normal judgement.  ____________________________________________   LABS (all labs ordered are listed, but only abnormal results are displayed)  Labs Reviewed  COMPREHENSIVE METABOLIC PANEL - Abnormal; Notable for the following:    Glucose, Bld 116 (*)    Anion gap 3 (*)    All other components within normal limits  URINALYSIS COMPLETEWITH MICROSCOPIC ([facility] ONLY) - Abnormal; Notable for the following:    Color, Urine STRAW (*)    APPearance CLEAR (*)    Specific Gravity, Urine 1.003 (*)    All other components within normal limits  LIPASE, BLOOD  CBC  TROPONIN I  TSH   ____________________________________________  EKG  ED ECG REPORT I, Eula Listen, the attending physician, personally viewed and interpreted this ECG.   Date: 10/25/2015  EKG Time: 1813  Rate: 85  Rhythm: normal sinus rhythm  Axis: normal  Intervals:none  ST&T Change: No ST elevation. No ischemic changes.  ____________________________________________  RADIOLOGY  Dg Chest 2 View  10/25/2015  CLINICAL DATA:  Generalized pain, initial encounter EXAM: CHEST  2 VIEW COMPARISON:  None. FINDINGS: The heart size and mediastinal contours are within normal limits. Both lungs are clear. The visualized skeletal structures shows mild scoliosis in the mid thoracic spine. Granuloma is noted within the left lung. IMPRESSION: No active cardiopulmonary disease. Electronically Signed   By: Inez Catalina M.D.   On: 10/25/2015 20:32    ____________________________________________   PROCEDURES  Procedure(s) performed: None  Critical Care performed: No ____________________________________________   INITIAL IMPRESSION / ASSESSMENT AND PLAN / ED COURSE  Pertinent labs & imaging results that were available during my care of the patient were reviewed by me and considered in my  medical decision making (see chart for details).  70 y.o. female with 4 days of intermittent and unrelated chest pain, right upper extremity band sensation, diaphoresis, nausea without vomiting. The patient has an atypical compilation of symptoms, but we will check her today for any acute pathology. Her EKG is reassuring and does not show any signs of ischemic changes. We'll get a troponin to rule out atypical ACS or MI. She will need a risk stratification study, but if her emergency department workup is negative, this can be done as an outpatient. In addition we'll check her thyroid as this may be the cause of her symptoms. I would consider other hormonal abnormalities including estrogen abnormalities that can be checked by her primary care physician. We will rule out UTI. At this time, the patient is asymptomatic and does not require any therapeutic intervention.  ----------------------------------------- 9:58 PM on 10/25/2015 -----------------------------------------  The patient's workup in the emergency department is reassuring. She has normal electrolytes, her TSH is normal and her urinalysis does not show UTI. She has an EKG without any ischemic changes and her troponin is negative. I do think the patient warrants further testing by her primary care physician for other causes of her symptoms, but at this time she does not have an acute medical condition. I spoken with the cardiologist on-call, who will have the appointment lined call the patient in the morning to schedule an outpatient stress test. The patient is ready for discharge and we have discussed return precautions as well as follow-up instructions. ____________________________________________  FINAL CLINICAL IMPRESSION(S) / ED DIAGNOSES  Final diagnoses:  Burning chest pain  Right arm pain  Diaphoresis  Nausea      NEW MEDICATIONS STARTED DURING THIS VISIT:  New Prescriptions   ONDANSETRON (ZOFRAN) 4 MG TABLET    Take 1  tablet (4 mg total) by mouth every 8 (eight) hours as needed for nausea or vomiting.     Eula Listen, MD 10/25/15 2159

## 2015-10-25 NOTE — ED Notes (Signed)
Pt presents with generalized body aches, nausea and night sweats for three days. Could not get in to see her pcp.

## 2015-10-26 ENCOUNTER — Telehealth: Payer: Self-pay | Admitting: Emergency Medicine

## 2015-10-26 NOTE — Telephone Encounter (Signed)
Pt called and says she did not get a paper prescription withe her instructions.  She can see the zofran on the instructions.  i called the med [facility].

## 2015-10-27 ENCOUNTER — Encounter: Payer: Self-pay | Admitting: Family Medicine

## 2015-10-27 ENCOUNTER — Ambulatory Visit (INDEPENDENT_AMBULATORY_CARE_PROVIDER_SITE_OTHER): Payer: Medicare Other | Admitting: Family Medicine

## 2015-10-27 VITALS — BP 120/80 | HR 94 | Temp 98.7°F | Resp 18 | Wt 114.1 lb

## 2015-10-27 DIAGNOSIS — R110 Nausea: Secondary | ICD-10-CM

## 2015-10-27 DIAGNOSIS — R7301 Impaired fasting glucose: Secondary | ICD-10-CM | POA: Diagnosis not present

## 2015-10-27 DIAGNOSIS — E079 Disorder of thyroid, unspecified: Secondary | ICD-10-CM

## 2015-10-27 DIAGNOSIS — L718 Other rosacea: Secondary | ICD-10-CM

## 2015-10-27 DIAGNOSIS — R079 Chest pain, unspecified: Secondary | ICD-10-CM | POA: Insufficient documentation

## 2015-10-27 DIAGNOSIS — E785 Hyperlipidemia, unspecified: Secondary | ICD-10-CM

## 2015-10-27 DIAGNOSIS — I208 Other forms of angina pectoris: Secondary | ICD-10-CM | POA: Diagnosis not present

## 2015-10-27 LAB — LIPID PANEL
Cholesterol: 174 mg/dL (ref 125–200)
HDL: 59 mg/dL (ref >=46)
LDL Cholesterol: 89 mg/dL (ref <130)
Total CHOL/HDL Ratio: 2.9 Ratio (ref <=5.0)
Triglycerides: 129 mg/dL (ref <150)
VLDL: 26 mg/dL (ref <30)

## 2015-10-27 LAB — BASIC METABOLIC PANEL WITH GFR
BUN: 12 mg/dL (ref 7–25)
CO2: 30 mmol/L (ref 20–31)
Calcium: 9.2 mg/dL (ref 8.6–10.4)
Chloride: 104 mmol/L (ref 98–110)
Creat: 0.87 mg/dL (ref 0.60–0.93)
GFR, Est African American: 77 mL/min (ref >=60)
GFR, Est Non African American: 67 mL/min (ref >=60)
Glucose, Bld: 90 mg/dL (ref 65–99)
Potassium: 4.7 mmol/L (ref 3.5–5.3)
Sodium: 141 mmol/L (ref 135–146)

## 2015-10-27 LAB — HEMOGLOBIN A1C
Hgb A1c MFr Bld: 5.5 % (ref <5.7)
Mean Plasma Glucose: 111 mg/dL

## 2015-10-27 MED ORDER — RANITIDINE HCL 150 MG PO TABS: 150.0000 mg | ORAL_TABLET | Freq: Every day | ORAL | Status: DC

## 2015-10-27 NOTE — Patient Instructions (Addendum)
Let's get labs today Find out if your sister is taking metformin or if she has tried other agents and if she had any reactions to anything If anything unusual, please call me Don't drive if trouble seeing or weakness, be safe We'll call you about your labs tomorrow

## 2015-10-27 NOTE — Assessment & Plan Note (Signed)
Normal TSH in ER, reviewed

## 2015-10-27 NOTE — Assessment & Plan Note (Signed)
Without shortness of breath or chest pain; she has ondansetron Rxd by ER staff; I suggested starting ranitidine for one month in case acid reflux part of the problem

## 2015-10-27 NOTE — Assessment & Plan Note (Signed)
Check a1c and glucose; she will check with her sister to see if she has taken and tolerated metformin; patient may get a glucose meter and check sugars if symptomatic

## 2015-10-27 NOTE — Progress Notes (Signed)
BP 120/80 mmHg  Pulse 94  Temp(Src) 98.7 F (37.1 C)  Resp 18  Wt 114 lb 2 oz (51.767 kg)  SpO2 98%   Subjective:    Patient ID: Angela Kelly, female    DOB: 08/13/1945, 70 y.o.   MRN: [id number]  HPI: Angela Kelly is a 70 y.o. female  Chief Complaint  Patient presents with  . Hospitalization Follow-up    for rt arm pain and indigestion    Gets episodes of nausea; she thinks her blood sugars are fluctuating She got sweaty, felt weak She woke up after derobing during the night She talked to her sister and she gets the same feelings when her sugar gets out of whack She had some indigestion and pain in her arm She goes right after here for the stress test She has vision issues anyway; she was able to drive this morning, but some blurred vision even with prescription sunglasses; safe to drive though she says Urinary frequency all day; dry mouth Labs collected, reviewed; urine was NOT after IVF; no glucose in the urine Glucose 116, all others CMET normal Normal CBC, trop I undetectable TSH normal Indigestion is better; they gave her something for nausea, but she hasn't taken it Sister has diabetes; she is eight years older; cousin on mother's side has diabetes Patient had diabetes test years ago and had hypoglycemia and that can develop into diabetes  Depression screen [facility] 2/9 08/05/2015 11/04/2014  Decreased Interest 0 0  Down, Depressed, Hopeless 0 0  PHQ - 2 Score 0 0    No flowsheet data found.  Relevant past medical, surgical, family and social history reviewed Past Medical History  Diagnosis Date  . GERD (gastroesophageal reflux disease)   . Arthritis   . Bilateral ovarian cysts     laterality not known  . Osteoporosis   . OSA (obstructive sleep apnea)   . Hyperlipidemia   . Thyroid disease   . IFG (impaired fasting glucose)   . Cataract of both eyes   . Thyroid nodule     left, sees Endocrinology  . Ocular rosacea     see's [facility]  .  Rosacea     telengiectatic  . Bunion   . Hammer toe   . Allergy   . History of pneumonia     in her 87's  . Elevated BP   . Shingles     left eye  . Fibrocystic breast   . Vitamin D deficiency disease   . Osteoarthritis cervical spine   . Osteoarthritis of both hands   . Deviated septum   . History of closed fracture of nasal bones    Past Surgical History  Procedure Laterality Date  . Breast surgery    . Bunionectomy Right   . Back surgery      lumbar laminectomy  . Biopsy thyroid    . Breast cyst aspiration Left    Family History  Problem Relation Age of Onset  . Asthma Mother   . Hypertension Mother   . Diabetes Mother   . Cancer Mother     Leukemia  . Peripheral vascular disease Mother   . Osteoporosis Mother   . Glaucoma Mother   . Heart disease Father   . Early death Father   . Heart attack Father   . Hypertension Sister   . Diabetes Sister   . Diabetes Maternal Grandmother   . Colon cancer Maternal Uncle   . Lung cancer  Paternal Uncle    Social History  Substance Use Topics  . Smoking status: Never Smoker   . Smokeless tobacco: Never Used  . Alcohol Use: No     Comment: occasional glass of wine    Interim medical history since last visit reviewed. Allergies and medications reviewed  Review of Systems Per HPI unless specifically indicated above     Objective:    BP 120/80 mmHg  Pulse 94  Temp(Src) 98.7 F (37.1 C)  Resp 18  Wt 114 lb 2 oz (51.767 kg)  SpO2 98%  Wt Readings from Last 3 Encounters:  10/27/15 114 lb 2 oz (51.767 kg)  10/25/15 111 lb (50.349 kg)  08/05/15 114 lb (51.71 kg)    Physical Exam  Constitutional: She appears well-developed and well-nourished. No distress.  HENT:  Head: Normocephalic and atraumatic.  Eyes: EOM are normal. No scleral icterus.  Neck: No thyromegaly present.  Cardiovascular: Normal rate, regular rhythm and normal heart sounds.   No murmur heard. Pulmonary/Chest: Effort normal and breath sounds  normal. No respiratory distress. She has no wheezes.  Abdominal: She exhibits no distension.  Musculoskeletal: Normal range of motion. She exhibits no edema.  Neurological: She is alert. She exhibits normal muscle tone.  Skin: Skin is warm and dry. She is not diaphoretic. No pallor.  Psychiatric: She has a normal mood and affect. Her behavior is normal. Judgment and thought content normal.    Results for orders placed or performed during the hospital encounter of 10/25/15  Lipase, blood  Result Value Ref Range   Lipase 28 11 - 51 U/L  Comprehensive metabolic panel  Result Value Ref Range   Sodium 139 135 - 145 mmol/L   Potassium 4.6 3.5 - 5.1 mmol/L   Chloride 104 101 - 111 mmol/L   CO2 32 22 - 32 mmol/L   Glucose, Bld 116 (H) 65 - 99 mg/dL   BUN 9 6 - 20 mg/dL   Creatinine, Ser 0.83 0.44 - 1.00 mg/dL   Calcium 9.5 8.9 - 10.3 mg/dL   Total Protein 6.5 6.5 - 8.1 g/dL   Albumin 3.8 3.5 - 5.0 g/dL   AST 22 15 - 41 U/L   ALT 17 14 - 54 U/L   Alkaline Phosphatase 93 38 - 126 U/L   Total Bilirubin 0.3 0.3 - 1.2 mg/dL   GFR calc non Af Amer >60 >60 mL/min   GFR calc Af Amer >60 >60 mL/min   Anion gap 3 (L) 5 - 15  CBC  Result Value Ref Range   WBC 8.6 3.6 - 11.0 K/uL   RBC 4.73 3.80 - 5.20 MIL/uL   Hemoglobin 13.9 12.0 - 16.0 g/dL   HCT 40.4 35.0 - 47.0 %   MCV 85.4 80.0 - 100.0 fL   MCH 29.5 26.0 - 34.0 pg   MCHC 34.5 32.0 - 36.0 g/dL   RDW 13.6 11.5 - 14.5 %   Platelets 325 150 - 440 K/uL  Urinalysis complete, with microscopic ([facility] only)  Result Value Ref Range   Color, Urine STRAW (A) YELLOW   APPearance CLEAR (A) CLEAR   Glucose, UA NEGATIVE NEGATIVE mg/dL   Bilirubin Urine NEGATIVE NEGATIVE   Ketones, ur NEGATIVE NEGATIVE mg/dL   Specific Gravity, Urine 1.003 (L) 1.005 - 1.030   Hgb urine dipstick NEGATIVE NEGATIVE   pH 6.0 5.0 - 8.0   Protein, ur NEGATIVE NEGATIVE mg/dL   Nitrite NEGATIVE NEGATIVE   Leukocytes, UA NEGATIVE NEGATIVE   RBC /  HPF NONE SEEN 0 - 5  RBC/hpf   WBC, UA NONE SEEN 0 - 5 WBC/hpf   Bacteria, UA NONE SEEN NONE SEEN   Squamous Epithelial / LPF NONE SEEN NONE SEEN  Troponin I  Result Value Ref Range   Troponin I <0.03 <0.03 ng/mL  TSH  Result Value Ref Range   TSH 1.146 0.350 - 4.500 uIU/mL      Assessment & Plan:   Problem List Items Addressed This Visit      Endocrine   Thyroid disease - Primary    Normal TSH in ER, reviewed      IFG (impaired fasting glucose)    Check a1c and glucose; she will check with her sister to see if she has taken and tolerated metformin; patient may get a glucose meter and check sugars if symptomatic        Musculoskeletal and Integument   Ocular rosacea    She asked if she should stop her antibiotic; I suggested she call the prescribing doctor; she appears to be doing so well in that regard, so I'll defer that to prescriber; I don't think her symptoms are from the antibiotic        Other   Nausea    Without shortness of breath or chest pain; she has ondansetron Rxd by ER staff; I suggested starting ranitidine for one month in case acid reflux part of the problem      Hyperlipidemia      Follow up plan: No Follow-up on file.  An after-visit summary was printed and given to the patient at [facility] [facility].  Please see the patient instructions which may contain other information and recommendations beyond what is mentioned above in the assessment and plan.  Meds ordered this encounter  Medications  . ranitidine (ZANTAC) 150 MG tablet    Sig: Take 1 tablet (150 mg total) by mouth at bedtime. For acid reflux and indigestion    Dispense:  30 tablet    Refill:  0

## 2015-10-27 NOTE — Assessment & Plan Note (Signed)
She asked if she should stop her antibiotic; I suggested she call the prescribing doctor; she appears to be doing so well in that regard, so I'll defer that to prescriber; I don't think her symptoms are from the antibiotic

## 2015-10-27 NOTE — Assessment & Plan Note (Signed)
Check lipids today 

## 2015-11-01 DIAGNOSIS — I208 Other forms of angina pectoris: Secondary | ICD-10-CM | POA: Diagnosis not present

## 2015-11-07 ENCOUNTER — Encounter: Payer: Self-pay | Admitting: Family Medicine

## 2015-11-07 ENCOUNTER — Ambulatory Visit (INDEPENDENT_AMBULATORY_CARE_PROVIDER_SITE_OTHER): Payer: Medicare Other | Admitting: Family Medicine

## 2015-11-07 VITALS — BP 138/78 | HR 96 | Temp 98.1°F | Resp 14 | Wt 112.5 lb

## 2015-11-07 DIAGNOSIS — Z1159 Encounter for screening for other viral diseases: Secondary | ICD-10-CM | POA: Diagnosis not present

## 2015-11-07 DIAGNOSIS — Z0000 Encounter for general adult medical examination without abnormal findings: Secondary | ICD-10-CM

## 2015-11-07 DIAGNOSIS — E041 Nontoxic single thyroid nodule: Secondary | ICD-10-CM

## 2015-11-07 NOTE — Assessment & Plan Note (Signed)
Refer to endo 

## 2015-11-07 NOTE — Assessment & Plan Note (Signed)
USPSTF grade A and B recommendations reviewed with patient; age-appropriate recommendations, preventive care, screening tests, etc discussed and encouraged; healthy living encouraged; see AVS for patient education given to patient  

## 2015-11-07 NOTE — Assessment & Plan Note (Signed)
Discussed one-time hep C screening recommendation for individuals born between 1945-1965 per USPSTF guidelines; patient agrees with testing; Hep C Ab ordered 

## 2015-11-07 NOTE — Progress Notes (Signed)
Patient: Angela Kelly, Female    DOB: March 24, 1946, 70 y.o.   MRN: [id number]  Visit Date: 11/07/2015  Today's Provider: Enid Derry, MD   Chief Complaint  Patient presents with  . Annual Exam    Medicare wellness   Subjective:   [name] is a 70 y.o. female who presents today for her Subsequent Annual Wellness Visit.  Caregiver input:  n/a  Since last visit, pt talked to her cardiologist about her symptoms; he thinks the zetia was the cause; she stopped the zetia and she is feeling better   Review of Systems  Past Medical History:  Diagnosis Date  . Allergy   . Arthritis   . Bilateral ovarian cysts    laterality not known  . Bunion   . Cataract of both eyes   . Deviated septum   . Elevated BP   . Fibrocystic breast   . GERD (gastroesophageal reflux disease)   . Hammer toe   . History of closed fracture of nasal bones   . History of pneumonia    in her 37's  . Hyperlipidemia   . IFG (impaired fasting glucose)   . Ocular rosacea    see's [facility]  . OSA (obstructive sleep apnea)   . Osteoarthritis cervical spine   . Osteoarthritis of both hands   . Osteoporosis   . Rosacea    telengiectatic  . Shingles    left eye  . Thyroid disease   . Thyroid nodule    left, sees Endocrinology  . Vitamin D deficiency disease    Past Surgical History:  Procedure Laterality Date  . BACK SURGERY     lumbar laminectomy  . BIOPSY THYROID    . BREAST CYST ASPIRATION Left   . BREAST SURGERY    . BUNIONECTOMY Right     Family History  Problem Relation Age of Onset  . Asthma Mother   . Hypertension Mother   . Diabetes Mother   . Cancer Mother     Leukemia  . Peripheral vascular disease Mother   . Osteoporosis Mother   . Glaucoma Mother   . Heart disease Father   . Early death Father   . Heart attack Father   . Hypertension Sister   . Diabetes Sister   . Diabetes Maternal Grandmother   . Colon cancer Maternal Uncle   . Lung cancer Paternal Uncle      Social History   Social History  . Marital status: Divorced    Spouse name: N/A  . Number of children: N/A  . Years of education: N/A   Occupational History  . Not on file.   Social History Main Topics  . Smoking status: Never Smoker  . Smokeless tobacco: Never Used  . Alcohol use No     Comment: occasional glass of wine  . Drug use: No  . Sexual activity: Not on file   Other Topics Concern  . Not on file   Social History Narrative  . No narrative on file    Outpatient Encounter Prescriptions as of 11/07/2015  Medication Sig Note  . aspirin EC 81 MG tablet Take 1 tablet (81 mg total) by mouth daily.   . B Complex-C (SUPER B COMPLEX PO) Take 1 capsule by mouth daily. Reported on 10/27/2015   . beta carotene w/minerals (OCUVITE) tablet Take 1 tablet by mouth daily. Reported on 10/27/2015   . Biotin 5000 MCG CAPS Take 1 capsule by mouth daily. Reported on  10/27/2015   . calcium carbonate (OS-CAL) 600 MG TABS tablet Take 600 mg by mouth 2 (two) times daily with a meal. Reported on 10/27/2015   . cholecalciferol (VITAMIN D) 1000 UNITS tablet Take 1,000 Units by mouth daily. Reported on 10/27/2015   . ciclesonide (OMNARIS) 50 MCG/ACT nasal spray Place 2 sprays into both nostrils daily. Reported on 10/27/2015   . diphenhydrAMINE (BENADRYL) 25 mg capsule Take 25 mg by mouth every 4 (four) hours as needed. Reported on 10/27/2015   . doxycycline (VIBRAMYCIN) 50 MG capsule Take 50 mg by mouth 2 (two) times daily. 11/07/2015: Received from: External Pharmacy Received Sig: TAKE ONE CAPSULE BY MOUTH TWICE DAILY  . ezetimibe (ZETIA) 10 MG tablet Take 1 tablet (10 mg total) by mouth daily. (Patient not taking: Reported on 10/27/2015)   . Ivermectin (SOOLANTRA) 1 % CREA Apply topically. Reported on 10/27/2015   . levothyroxine (SYNTHROID, LEVOTHROID) 75 MCG tablet Take 1 tablet (75 mcg total) by mouth daily.   Marland Kitchen Lysine 1000 MG TABS Take 1 tablet by mouth as needed. Reported on 10/27/2015   .  mometasone (NASONEX) 50 MCG/ACT nasal spray INSTILL 2 SPRAYS INTO EACH NOSTRIL DAILY (Patient not taking: Reported on 10/27/2015)   . Multiple Vitamin (MULTIVITAMIN) capsule Take 1 capsule by mouth daily. Reported on 10/27/2015   . Omega-3 Fatty Acids (FISH OIL PO) Take 2 capsules by mouth daily. Reported on 10/27/2015   . ondansetron (ZOFRAN) 4 MG tablet Take 1 tablet (4 mg total) by mouth every 8 (eight) hours as needed for nausea or vomiting. (Patient not taking: Reported on 10/27/2015)   . PAZEO 0.7 % SOLN Place 1 drop into both eyes daily. Reported on 10/27/2015 08/03/2014: Received from: External Pharmacy Received Sig:   . Potassium Gluconate 595 MG CAPS Take 1 capsule by mouth daily. Reported on 10/27/2015   . Probiotic Product (PROBIOTIC DAILY PO) Take 1 capsule by mouth 2 (two) times daily. Reported on 10/27/2015   . ranitidine (ZANTAC) 150 MG tablet Take 1 tablet (150 mg total) by mouth at bedtime. For acid reflux and indigestion   . SOOLANTRA 1 % CREA Apply topically daily. Reported on 10/27/2015 08/03/2014: Received from: External Pharmacy  . vitamin B-12 (CYANOCOBALAMIN) 100 MCG tablet Take 100 mcg by mouth daily. Reported on 10/27/2015   . vitamin C (ASCORBIC ACID) 500 MG tablet Take 500 mg by mouth daily. Reported on 10/27/2015   . vitamin E 400 UNIT capsule Take 400 Units by mouth daily. Reported on 10/27/2015    No facility-administered encounter medications on file as of 11/07/2015.     Functional Ability / Safety Screening 1.  Was the timed Get Up and Go test longer than 30 seconds?  no 2.  Does the patient need help with the phone, transportation, shopping,      preparing meals, housework, laundry, medications, or managing money?  no 3.  Does the patient's home have:  loose throw rugs in the hallway?   no      Grab bars in the bathroom? no      Handrails on the stairs?   no , encouraged rails to porch      Poor lighting?   no 4.  Has the patient noticed any hearing difficulties?    no  Fall Risk Assessment See under rooming  Depression Screen See under rooming Depression screen [facility] 2/9 11/07/2015 08/05/2015 11/04/2014  Decreased Interest 0 0 0  Down, Depressed, Hopeless 0 0 0  PHQ - 2 Score  0 0 0    Advanced Directives Does patient have a HCPOA?    no getting ready to fill that out If yes, name and contact information:  Does patient have a living will or MOST form?  no, will get that done  Objective:   Vitals: BP 138/78   Pulse 96   Temp 98.1 F (36.7 C) (Oral)   Resp 14   Wt 112 lb 8 oz (51 kg)   SpO2 96%   BMI 23.51 kg/m  Body mass index is 23.51 kg/m. No exam data present  Physical Exam Mood/affect:  euthymic Appearance:  neat  Cognitive Testing - 6-CIT  Correct? Score   What year is it? yes 0 Yes = 0    No = 4  What month is it? yes 0 Yes = 0    No = 3  Remember:     [place]ia [place], [place], [place]     What time is it? yes 0 Yes = 0    No = 3  Count backwards from 20 to 1 yes 0 Correct = 0    1 error = 2   More than 1 error = 4  Say the months of the year in reverse. yes 0 Correct = 0    1 error = 2   More than 1 error = 4  What address did I ask you to remember? yes 0 Correct = 0  1 error = 2    2 error = 4    3 error = 6    4 error = 8    All wrong = 10       TOTAL SCORE  0/28   Interpretation:  Normal  Normal (0-7) Abnormal (8-28)   Assessment & Plan:     Annual Wellness Visit  Reviewed patient's Family Medical History; diabetes, HTN, osteoporosis, leukemia, cholesterol Reviewed and updated list of patient's medical providers; new endo Assessment of cognitive impairment was done; passed Assessed patient's functional ability; passed Established a written schedule for health screening [place] Completed and Reviewed  Exercise Activities and Dietary recommendations Goals    None    trying to go to the gym more often  Immunization History  Administered Date(s) Administered  . Pneumococcal  Conjugate-13 11/02/2013  . Pneumococcal Polysaccharide-23 03/30/2011  . Tdap 04/16/2010  . Zoster 06/14/2013   Health Maintenance  Topic Date Due  . Hepatitis C Screening  Feb 01, 1946  . INFLUENZA VACCINE  11/15/2015  . MAMMOGRAM  01/10/2017  . TETANUS/TDAP  04/16/2020  . COLONOSCOPY  11/05/2023  . DEXA SCAN  Addressed  . ZOSTAVAX  Completed  . PNA vac Low Risk Adult  Completed    Discussed health benefits of physical activity, and encouraged her to engage in regular exercise appropriate for her age and condition.   Meds ordered this encounter  Medications  . doxycycline (VIBRAMYCIN) 50 MG capsule    Sig: Take 50 mg by mouth 2 (two) times daily.    Refill:  3    Current Outpatient Prescriptions:  .  aspirin EC 81 MG tablet, Take 1 tablet (81 mg total) by mouth daily., Disp: 30 tablet, Rfl: 11 .  B Complex-C (SUPER B COMPLEX PO), Take 1 capsule by mouth daily. Reported on 10/27/2015, Disp: , Rfl:  .  beta carotene w/minerals (OCUVITE) tablet, Take 1 tablet by mouth daily. Reported on 10/27/2015, Disp: , Rfl:  .  Biotin 5000 MCG CAPS, Take 1 capsule  by mouth daily. Reported on 10/27/2015, Disp: , Rfl:  .  calcium carbonate (OS-CAL) 600 MG TABS tablet, Take 600 mg by mouth 2 (two) times daily with a meal. Reported on 10/27/2015, Disp: , Rfl:  .  cholecalciferol (VITAMIN D) 1000 UNITS tablet, Take 1,000 Units by mouth daily. Reported on 10/27/2015, Disp: , Rfl:  .  ciclesonide (OMNARIS) 50 MCG/ACT nasal spray, Place 2 sprays into both nostrils daily. Reported on 10/27/2015, Disp: , Rfl:  .  diphenhydrAMINE (BENADRYL) 25 mg capsule, Take 25 mg by mouth every 4 (four) hours as needed. Reported on 10/27/2015, Disp: , Rfl:  .  doxycycline (VIBRAMYCIN) 50 MG capsule, Take 50 mg by mouth 2 (two) times daily., Disp: , Rfl: 3 .  ezetimibe (ZETIA) 10 MG tablet, Take 1 tablet (10 mg total) by mouth daily. (Patient not taking: Reported on 10/27/2015), Disp: 30 tablet, Rfl: 5 .  Ivermectin (SOOLANTRA) 1  % CREA, Apply topically. Reported on 10/27/2015, Disp: , Rfl:  .  levothyroxine (SYNTHROID, LEVOTHROID) 75 MCG tablet, Take 1 tablet (75 mcg total) by mouth daily., Disp: 30 tablet, Rfl: 11 .  Lysine 1000 MG TABS, Take 1 tablet by mouth as needed. Reported on 10/27/2015, Disp: , Rfl:  .  mometasone (NASONEX) 50 MCG/ACT nasal spray, INSTILL 2 SPRAYS INTO EACH NOSTRIL DAILY (Patient not taking: Reported on 10/27/2015), Disp: 17 g, Rfl: 11 .  Multiple Vitamin (MULTIVITAMIN) capsule, Take 1 capsule by mouth daily. Reported on 10/27/2015, Disp: , Rfl:  .  Omega-3 Fatty Acids (FISH OIL PO), Take 2 capsules by mouth daily. Reported on 10/27/2015, Disp: , Rfl:  .  ondansetron (ZOFRAN) 4 MG tablet, Take 1 tablet (4 mg total) by mouth every 8 (eight) hours as needed for nausea or vomiting. (Patient not taking: Reported on 10/27/2015), Disp: 15 tablet, Rfl: 0 .  PAZEO 0.7 % SOLN, Place 1 drop into both eyes daily. Reported on 10/27/2015, Disp: , Rfl: 5 .  Potassium Gluconate 595 MG CAPS, Take 1 capsule by mouth daily. Reported on 10/27/2015, Disp: , Rfl:  .  Probiotic Product (PROBIOTIC DAILY PO), Take 1 capsule by mouth 2 (two) times daily. Reported on 10/27/2015, Disp: , Rfl:  .  ranitidine (ZANTAC) 150 MG tablet, Take 1 tablet (150 mg total) by mouth at bedtime. For acid reflux and indigestion, Disp: 30 tablet, Rfl: 0 .  SOOLANTRA 1 % CREA, Apply topically daily. Reported on 10/27/2015, Disp: , Rfl: 0 .  vitamin B-12 (CYANOCOBALAMIN) 100 MCG tablet, Take 100 mcg by mouth daily. Reported on 10/27/2015, Disp: , Rfl:  .  vitamin C (ASCORBIC ACID) 500 MG tablet, Take 500 mg by mouth daily. Reported on 10/27/2015, Disp: , Rfl:  .  vitamin E 400 UNIT capsule, Take 400 Units by mouth daily. Reported on 10/27/2015, Disp: , Rfl:  There are no discontinued medications.  Next Medicare Wellness Visit in 12+ months

## 2015-11-07 NOTE — Patient Instructions (Addendum)
Health Maintenance  Topic Date Due  . Hepatitis C Screening  01/28/1946  . INFLUENZA VACCINE  11/15/2015  . MAMMOGRAM  01/10/2017  . TETANUS/TDAP  04/16/2020  . COLONOSCOPY  11/05/2023  . DEXA SCAN  Addressed  . ZOSTAVAX  Completed  . PNA vac Low Risk Adult  Completed   Check on colonoscopy to make sure they gave you a 10 year pass You have had both pneumonia vaccines We'll check the hepatitis C labs  Health Maintenance, Female Adopting a healthy lifestyle and getting preventive care can go a long way to promote health and wellness. Talk with your health care provider about what schedule of regular examinations is right for you. This is a good chance for you to check in with your provider about disease prevention and staying healthy. In between checkups, there are plenty of things you can do on your own. Experts have done a lot of research about which lifestyle changes and preventive measures are most likely to keep you healthy. Ask your health care provider for more information. WEIGHT AND DIET  Eat a healthy diet  Be sure to include plenty of vegetables, fruits, low-fat dairy products, and lean protein.  Do not eat a lot of foods high in solid fats, added sugars, or salt.  Get regular exercise. This is one of the most important things you can do for your health.  Most adults should exercise for at least 150 minutes each week. The exercise should increase your heart rate and make you sweat (moderate-intensity exercise).  Most adults should also do strengthening exercises at least twice a week. This is in addition to the moderate-intensity exercise.  Maintain a healthy weight  Body mass index (BMI) is a measurement that can be used to identify possible weight problems. It estimates body fat based on height and weight. Your health care provider can help determine your BMI and help you achieve or maintain a healthy weight.  For females 25 years of age and older:   A BMI below 18.5  is considered underweight.  A BMI of 18.5 to 24.9 is normal.  A BMI of 25 to 29.9 is considered overweight.  A BMI of 30 and above is considered obese.  Watch levels of cholesterol and blood lipids  You should start having your blood tested for lipids and cholesterol at 72 years of age, then have this test every 5 years.  You may need to have your cholesterol levels checked more often if:  Your lipid or cholesterol levels are high.  You are older than 72 years of age.  You are at high risk for heart disease.  CANCER SCREENING   Lung Cancer  Lung cancer screening is recommended for adults 25-68 years old who are at high risk for lung cancer because of a history of smoking.  A yearly low-dose CT scan of the lungs is recommended for people who:  Currently smoke.  Have quit within the past 15 years.  Have at least a 30-pack-year history of smoking. A pack year is smoking an average of one pack of cigarettes a day for 1 year.  Yearly screening should continue until it has been 15 years since you quit.  Yearly screening should stop if you develop a health problem that would prevent you from having lung cancer treatment.  Breast Cancer  Practice breast self-awareness. This means understanding how your breasts normally appear and feel.  It also means doing regular breast self-exams. Let your health care provider know  about any changes, no matter how small.  If you are in your 20s or 30s, you should have a clinical breast exam (CBE) by a health care provider every 1-3 years as part of a regular health exam.  If you are 73 or older, have a CBE every year. Also consider having a breast X-ray (mammogram) every year.  If you have a family history of breast cancer, talk to your health care provider about genetic screening.  If you are at high risk for breast cancer, talk to your health care provider about having an MRI and a mammogram every year.  Breast cancer gene (BRCA)  assessment is recommended for women who have family members with BRCA-related cancers. BRCA-related cancers include:  Breast.  Ovarian.  Tubal.  Peritoneal cancers.  Results of the assessment will determine the need for genetic counseling and BRCA1 and BRCA2 testing. Cervical Cancer Your health care provider may recommend that you be screened regularly for cancer of the pelvic organs (ovaries, uterus, and vagina). This screening involves a pelvic examination, including checking for microscopic changes to the surface of your cervix (Pap test). You may be encouraged to have this screening done every 3 years, beginning at age 54.  For women ages 39-65, health care providers may recommend pelvic exams and Pap testing every 3 years, or they may recommend the Pap and pelvic exam, combined with testing for human papilloma virus (HPV), every 5 years. Some types of HPV increase your risk of cervical cancer. Testing for HPV may also be done on women of any age with unclear Pap test results.  Other health care providers may not recommend any screening for nonpregnant women who are considered low risk for pelvic cancer and who do not have symptoms. Ask your health care provider if a screening pelvic exam is right for you.  If you have had past treatment for cervical cancer or a condition that could lead to cancer, you need Pap tests and screening for cancer for at least 20 years after your treatment. If Pap tests have been discontinued, your risk factors (such as having a new sexual partner) need to be reassessed to determine if screening should resume. Some women have medical problems that increase the chance of getting cervical cancer. In these cases, your health care provider may recommend more frequent screening and Pap tests. Colorectal Cancer  This type of cancer can be detected and often prevented.  Routine colorectal cancer screening usually begins at 72 years of age and continues through 72 years  of age.  Your health care provider may recommend screening at an earlier age if you have risk factors for colon cancer.  Your health care provider may also recommend using home test kits to check for hidden blood in the stool.  A small camera at the end of a tube can be used to examine your colon directly (sigmoidoscopy or colonoscopy). This is done to check for the earliest forms of colorectal cancer.  Routine screening usually begins at age 42.  Direct examination of the colon should be repeated every 5-10 years through 72 years of age. However, you may need to be screened more often if early forms of precancerous polyps or small growths are found. Skin Cancer  Check your skin from head to toe regularly.  Tell your health care provider about any new moles or changes in moles, especially if there is a change in a mole's shape or color.  Also tell your health care provider if you  have a mole that is larger than the size of a pencil eraser.  Always use sunscreen. Apply sunscreen liberally and repeatedly throughout the day.  Protect yourself by wearing long sleeves, pants, a wide-brimmed hat, and sunglasses whenever you are outside. HEART DISEASE, DIABETES, AND HIGH BLOOD PRESSURE   High blood pressure causes heart disease and increases the risk of stroke. High blood pressure is more likely to develop in:  People who have blood pressure in the high end of the normal range (130-139/85-89 mm Hg).  People who are overweight or obese.  People who are African American.  If you are 9-44 years of age, have your blood pressure checked every 3-5 years. If you are 69 years of age or older, have your blood pressure checked every year. You should have your blood pressure measured twice--once when you are at a hospital or clinic, and once when you are not at a hospital or clinic. Record the average of the two measurements. To check your blood pressure when you are not at a hospital or clinic, you  can use:  An automated blood pressure machine at a pharmacy.  A home blood pressure monitor.  If you are between 75 years and 92 years old, ask your health care provider if you should take aspirin to prevent strokes.  Have regular diabetes screenings. This involves taking a blood sample to check your fasting blood sugar level.  If you are at a normal weight and have a low risk for diabetes, have this test once every three years after 72 years of age.  If you are overweight and have a high risk for diabetes, consider being tested at a younger age or more often. PREVENTING INFECTION  Hepatitis B  If you have a higher risk for hepatitis B, you should be screened for this virus. You are considered at high risk for hepatitis B if:  You were born in a country where hepatitis B is common. Ask your health care provider which countries are considered high risk.  Your parents were born in a high-risk country, and you have not been immunized against hepatitis B (hepatitis B vaccine).  You have HIV or AIDS.  You use needles to inject street drugs.  You live with someone who has hepatitis B.  You have had sex with someone who has hepatitis B.  You get hemodialysis treatment.  You take certain medicines for conditions, including cancer, organ transplantation, and autoimmune conditions. Hepatitis C  Blood testing is recommended for:  Everyone born from [birth year] through 1967.  Anyone with known risk factors for hepatitis C. Sexually transmitted infections (STIs)  You should be screened for sexually transmitted infections (STIs) including gonorrhea and chlamydia if:  You are sexually active and are younger than 72 years of age.  You are older than 72 years of age and your health care provider tells you that you are at risk for this type of infection.  Your sexual activity has changed since you were last screened and you are at an increased risk for chlamydia or gonorrhea. Ask your health  care provider if you are at risk.  If you do not have HIV, but are at risk, it may be recommended that you take a prescription medicine daily to prevent HIV infection. This is called pre-exposure prophylaxis (PrEP). You are considered at risk if:  You are sexually active and do not regularly use condoms or know the HIV status of your partner(s).  You take drugs by injection.  You are sexually active with a partner who has HIV. Talk with your health care provider about whether you are at high risk of being infected with HIV. If you choose to begin PrEP, you should first be tested for HIV. You should then be tested every 3 months for as long as you are taking PrEP.  PREGNANCY   If you are premenopausal and you may become pregnant, ask your health care provider about preconception counseling.  If you may become pregnant, take 400 to 800 micrograms (mcg) of folic acid every day.  If you want to prevent pregnancy, talk to your health care provider about birth control (contraception). OSTEOPOROSIS AND MENOPAUSE   Osteoporosis is a disease in which the bones lose minerals and strength with aging. This can result in serious bone fractures. Your risk for osteoporosis can be identified using a bone density scan.  If you are 13 years of age or older, or if you are at risk for osteoporosis and fractures, ask your health care provider if you should be screened.  Ask your health care provider whether you should take a calcium or vitamin D supplement to lower your risk for osteoporosis.  Menopause may have certain physical symptoms and risks.  Hormone replacement therapy may reduce some of these symptoms and risks. Talk to your health care provider about whether hormone replacement therapy is right for you.  HOME CARE INSTRUCTIONS   Schedule regular health, dental, and eye exams.  Stay current with your immunizations.   Do not use any tobacco products including cigarettes, chewing tobacco, or  electronic cigarettes.  If you are pregnant, do not drink alcohol.  If you are breastfeeding, limit how much and how often you drink alcohol.  Limit alcohol intake to no more than 1 drink per day for nonpregnant women. One drink equals 12 ounces of beer, 5 ounces of wine, or 1 ounces of hard liquor.  Do not use street drugs.  Do not share needles.  Ask your health care provider for help if you need support or information about quitting drugs.  Tell your health care provider if you often feel depressed.  Tell your health care provider if you have ever been abused or do not feel safe at home.   This information is not intended to replace advice given to you by your health care provider. Make sure you discuss any questions you have with your health care provider.   Document Released: 10/16/2010 Document Revised: 04/23/2014 Document Reviewed: 03/04/2013 Elsevier Interactive Patient Education [employer].

## 2015-11-08 LAB — HEPATITIS C ANTIBODY: HCV Ab: NEGATIVE

## 2015-11-11 ENCOUNTER — Ambulatory Visit: Payer: Medicare Other | Admitting: Family Medicine

## 2016-01-20 DIAGNOSIS — E041 Nontoxic single thyroid nodule: Secondary | ICD-10-CM | POA: Diagnosis not present

## 2016-01-24 DIAGNOSIS — E041 Nontoxic single thyroid nodule: Secondary | ICD-10-CM | POA: Diagnosis not present

## 2016-01-30 ENCOUNTER — Other Ambulatory Visit: Payer: Self-pay | Admitting: Family Medicine

## 2016-01-30 DIAGNOSIS — Z1231 Encounter for screening mammogram for malignant neoplasm of breast: Secondary | ICD-10-CM

## 2016-01-31 DIAGNOSIS — B028 Zoster with other complications: Secondary | ICD-10-CM | POA: Diagnosis not present

## 2016-02-07 ENCOUNTER — Ambulatory Visit (INDEPENDENT_AMBULATORY_CARE_PROVIDER_SITE_OTHER): Payer: Medicare Other | Admitting: Family Medicine

## 2016-02-07 ENCOUNTER — Encounter: Payer: Self-pay | Admitting: Family Medicine

## 2016-02-07 DIAGNOSIS — E041 Nontoxic single thyroid nodule: Secondary | ICD-10-CM

## 2016-02-07 DIAGNOSIS — G4733 Obstructive sleep apnea (adult) (pediatric): Secondary | ICD-10-CM

## 2016-02-07 DIAGNOSIS — E782 Mixed hyperlipidemia: Secondary | ICD-10-CM | POA: Diagnosis not present

## 2016-02-07 DIAGNOSIS — J3081 Allergic rhinitis due to animal (cat) (dog) hair and dander: Secondary | ICD-10-CM | POA: Diagnosis not present

## 2016-02-07 NOTE — Progress Notes (Signed)
BP 124/80   Pulse 98   Temp 98 F (36.7 C) (Oral)   Resp 14   Wt 112 lb (50.8 kg)   SpO2 95%   BMI 23.41 kg/m    Subjective:    Patient ID: [name], female    DOB: 1946/01/10, 70 y.o.   MRN: [id number]  HPI: KRISTE IIAMS is a 70 y.o. female  Chief Complaint  Patient presents with  . Follow-up   She is here for f/u from the episode from before; she was on the statin and she is here for f/u She has started going to the gym and moving around; feeling better; she is going to [employer]; does the treadmill and circuit of machines, takes 30 minutes  High cholesterol; she stopped the zetia Only eats [place] and chicken and fish; olive oil Quit using powdered cremora in the coffee, using almond milk; not many sweets Mother had very high cholesterol; sister also Sister was just diagnosed with glaucoma; had a visit with her in [place]  Allergic rhinitis; it's been rough this fall; using benadryl, stays at home when taking that  She was prediabetic and then last A1c was normal range; patient really watching her diet  She just had a thyroid nodule biopsy which was benign, but endocrinologist wants to see her back in 6 months; something about there being "cells" in the FNA sample; I tried to pull up report from [facility]; I was able to see that it was done, but could not view report  Depression screen [facility] 2/9 02/07/2016 11/07/2015 08/05/2015 11/04/2014  Decreased Interest 0 0 0 0  Down, Depressed, Hopeless 0 0 0 0  PHQ - 2 Score 0 0 0 0   Relevant past medical, surgical, family and social history reviewed Past Medical History:  Diagnosis Date  . Allergic rhinitis   . Allergy   . Arthritis   . Bilateral ovarian cysts    laterality not known  . Bunion   . Cataract of both eyes   . Deviated septum   . Elevated BP   . Fibrocystic breast   . GERD (gastroesophageal reflux disease)   . Hammer toe   . History of closed fracture of nasal bones   . History of pneumonia    in her 70's  . Hyperlipidemia   . IFG (impaired fasting glucose)   . Ocular rosacea    see's [facility]  . OSA (obstructive sleep apnea)   . Osteoarthritis cervical spine   . Osteoarthritis of both hands   . Osteoporosis   . Rosacea    telengiectatic  . Shingles    left eye  . Thyroid disease   . Thyroid nodule    left, sees Endocrinology  . Vitamin D deficiency disease    Past Surgical History:  Procedure Laterality Date  . BACK SURGERY     lumbar laminectomy  . BIOPSY THYROID    . BREAST CYST ASPIRATION Left   . BREAST SURGERY    . BUNIONECTOMY Right    Family History  Problem Relation Age of Onset  . Asthma Mother   . Hypertension Mother   . Diabetes Mother   . Cancer Mother     Leukemia  . Peripheral vascular disease Mother   . Osteoporosis Mother   . Glaucoma Mother   . Heart disease Father   . Early death Father   . Heart attack Father   . Hypertension Sister   . Diabetes Sister   .  Glaucoma Sister   . Diabetes Maternal Grandmother   . Colon cancer Maternal Uncle   . Lung cancer Paternal Uncle    Social History  Substance Use Topics  . Smoking status: Never Smoker  . Smokeless tobacco: Never Used  . Alcohol use No     Comment: occasional glass of wine    Interim medical history since last visit reviewed. Allergies and medications reviewed  Review of Systems Per HPI unless specifically indicated above     Objective:    BP 124/80   Pulse 98   Temp 98 F (36.7 C) (Oral)   Resp 14   Wt 112 lb (50.8 kg)   SpO2 95%   BMI 23.41 kg/m   Wt Readings from Last 3 Encounters:  02/07/16 112 lb (50.8 kg)  11/07/15 112 lb 8 oz (51 kg)  10/27/15 114 lb 2 oz (51.8 kg)    Physical Exam  Constitutional: She appears well-developed and well-nourished. No distress.  HENT:  Head: Normocephalic and atraumatic.  Eyes: EOM are normal. Right eye exhibits no discharge. Left eye exhibits no discharge. No scleral icterus.  Neck: No thyromegaly  present.  Cardiovascular: Normal rate, regular rhythm and normal heart sounds.   No murmur heard. Pulmonary/Chest: Effort normal and breath sounds normal.  Abdominal: She exhibits no distension.  Musculoskeletal: Normal range of motion. She exhibits no edema.  Neurological: She is alert. She exhibits normal muscle tone.  Skin: Skin is warm and dry. She is not diaphoretic. No pallor.  Psychiatric: She has a normal mood and affect. Her behavior is normal. Judgment and thought content normal.    Results for orders placed or performed in visit on 11/07/15  Hepatitis C Antibody  Result Value Ref Range   HCV Ab NEGATIVE NEGATIVE      Assessment & Plan:   Problem List Items Addressed This Visit      Respiratory   OSA (obstructive sleep apnea)    Not really a problem right now      Allergic rhinitis    Avoid decongestants        Endocrine   Left thyroid nodule    Followed by endocrinologist        Other   Hyperlipidemia    Check lipids, avoid saturated fats      Relevant Orders   Lipid panel    Other Visit Diagnoses   None.      Follow up plan: Return in about 6 months (around 08/20/2016) for follow-up; return for fasting labs in the next week or so.  An after-visit summary was printed and given to the patient at [place].  Please see the patient instructions which may contain other information and recommendations beyond what is mentioned above in the assessment and plan.  No orders of the defined types were placed in this encounter.   Orders Placed This Encounter  Procedures  . Lipid panel

## 2016-02-07 NOTE — Assessment & Plan Note (Signed)
Not really a problem right now

## 2016-02-07 NOTE — Assessment & Plan Note (Signed)
Check lipids, avoid saturated fats 

## 2016-02-07 NOTE — Assessment & Plan Note (Signed)
Avoid decongestants 

## 2016-02-07 NOTE — Assessment & Plan Note (Signed)
Followed by endocrinologist

## 2016-02-14 ENCOUNTER — Other Ambulatory Visit: Payer: Self-pay

## 2016-02-14 ENCOUNTER — Other Ambulatory Visit: Payer: Medicare Other

## 2016-02-14 DIAGNOSIS — E782 Mixed hyperlipidemia: Secondary | ICD-10-CM | POA: Diagnosis not present

## 2016-02-14 LAB — LIPID PANEL
Cholesterol: 236 mg/dL — ABNORMAL HIGH (ref 125–200)
HDL: 61 mg/dL (ref >=46)
LDL Cholesterol: 142 mg/dL — ABNORMAL HIGH (ref <130)
Total CHOL/HDL Ratio: 3.9 Ratio (ref <=5.0)
Triglycerides: 164 mg/dL — ABNORMAL HIGH (ref <150)
VLDL: 33 mg/dL — ABNORMAL HIGH (ref <30)

## 2016-02-28 ENCOUNTER — Ambulatory Visit
Admission: RE | Admit: 2016-02-28 | Discharge: 2016-02-28 | Disposition: A | Discharge status: Home / Self Care | Payer: Medicare Other | Source: Ambulatory Visit | Attending: Family Medicine | Admitting: Family Medicine

## 2016-02-28 DIAGNOSIS — Z1231 Encounter for screening mammogram for malignant neoplasm of breast: Secondary | ICD-10-CM

## 2016-07-23 ENCOUNTER — Other Ambulatory Visit: Payer: Self-pay

## 2016-07-23 MED ORDER — LEVOTHYROXINE SODIUM 75 MCG PO TABS: 75.0000 ug | ORAL_TABLET | Freq: Every day | ORAL | 1 refills | Status: DC

## 2016-07-23 MED ORDER — MOMETASONE FUROATE 50 MCG/ACT NA SUSP: NASAL | 3 refills | Status: DC

## 2016-07-23 NOTE — Telephone Encounter (Signed)
90 day supply

## 2016-07-31 DIAGNOSIS — H179 Unspecified corneal scar and opacity: Secondary | ICD-10-CM | POA: Diagnosis not present

## 2016-08-06 DIAGNOSIS — L718 Other rosacea: Secondary | ICD-10-CM | POA: Diagnosis not present

## 2016-08-15 ENCOUNTER — Other Ambulatory Visit: Payer: Self-pay | Admitting: Family Medicine

## 2016-08-15 NOTE — Telephone Encounter (Signed)
I just approved a 6 month supply of thyroid medicine to [facility] in April; please resolve with pharmacy Thank you  Medication Detail    Disp Refills Start End   levothyroxine (SYNTHROID, LEVOTHROID) 75 MCG tablet 90 tablet 1 07/23/2016    Sig - Route: Take 1 tablet (75 mcg total) by mouth daily. - Oral   E-Prescribing Status: Receipt confirmed by pharmacy (07/23/2016 9:16 PM EDT)

## 2016-08-15 NOTE — Telephone Encounter (Signed)
Left patient voicemail.

## 2016-08-15 NOTE — Telephone Encounter (Signed)
PT HAS APPT ON MON MAY 7 BUT WILL BE OUT OF HER THYROID MEDICATION. [facility]ARM [facility]

## 2016-08-20 ENCOUNTER — Ambulatory Visit (INDEPENDENT_AMBULATORY_CARE_PROVIDER_SITE_OTHER): Payer: Medicare Other | Admitting: Family Medicine

## 2016-08-20 ENCOUNTER — Encounter: Payer: Self-pay | Admitting: Family Medicine

## 2016-08-20 DIAGNOSIS — E041 Nontoxic single thyroid nodule: Secondary | ICD-10-CM | POA: Diagnosis not present

## 2016-08-20 DIAGNOSIS — E782 Mixed hyperlipidemia: Secondary | ICD-10-CM

## 2016-08-20 DIAGNOSIS — Z5181 Encounter for therapeutic drug level monitoring: Secondary | ICD-10-CM | POA: Diagnosis not present

## 2016-08-20 DIAGNOSIS — E039 Hypothyroidism, unspecified: Secondary | ICD-10-CM

## 2016-08-20 DIAGNOSIS — J3081 Allergic rhinitis due to animal (cat) (dog) hair and dander: Secondary | ICD-10-CM

## 2016-08-20 DIAGNOSIS — R7301 Impaired fasting glucose: Secondary | ICD-10-CM

## 2016-08-20 LAB — COMPLETE METABOLIC PANEL WITH GFR
ALT: 16 U/L (ref 6–29)
AST: 25 U/L (ref 10–35)
Albumin: 3.8 g/dL (ref 3.6–5.1)
Alkaline Phosphatase: 79 U/L (ref 33–130)
BUN: 13 mg/dL (ref 7–25)
CO2: 32 mmol/L — ABNORMAL HIGH (ref 20–31)
Calcium: 9 mg/dL (ref 8.6–10.4)
Chloride: 103 mmol/L (ref 98–110)
Creat: 0.97 mg/dL — ABNORMAL HIGH (ref 0.60–0.93)
GFR, Est African American: 67 mL/min (ref >=60)
GFR, Est Non African American: 58 mL/min — ABNORMAL LOW (ref >=60)
Glucose, Bld: 85 mg/dL (ref 65–99)
Potassium: 4.8 mmol/L (ref 3.5–5.3)
Sodium: 139 mmol/L (ref 135–146)
Total Bilirubin: 0.4 mg/dL (ref 0.2–1.2)
Total Protein: 6.2 g/dL (ref 6.1–8.1)

## 2016-08-20 LAB — LIPID PANEL
Cholesterol: 209 mg/dL — ABNORMAL HIGH (ref <200)
HDL: 64 mg/dL (ref >50)
LDL Cholesterol: 120 mg/dL — ABNORMAL HIGH (ref <100)
Total CHOL/HDL Ratio: 3.3 Ratio (ref <5.0)
Triglycerides: 125 mg/dL (ref <150)
VLDL: 25 mg/dL (ref <30)

## 2016-08-20 LAB — TSH: TSH: 0.42 mIU/L

## 2016-08-20 MED ORDER — LEVOTHYROXINE SODIUM 75 MCG PO TABS: 75.0000 ug | ORAL_TABLET | Freq: Every day | ORAL | 3 refills | Status: DC

## 2016-08-20 NOTE — Assessment & Plan Note (Signed)
Today is truly fasting; check glucose and A1c

## 2016-08-20 NOTE — Progress Notes (Signed)
BP 126/74   Pulse 95   Temp 97.4 F (36.3 C) (Oral)   Resp 14   Wt 118 lb 9.6 oz (53.8 kg)   SpO2 96%   BMI 24.79 kg/m    Subjective:    Patient ID: Angela Kelly, female    DOB: 06/12/1945, 71 y.o.   MRN: [id number]  HPI: Angela Kelly is a 71 y.o. female  Chief Complaint  Patient presents with  . Follow-up  . Medication Refill    levothyroxine    HPI  Hypothyroidism She has had trouble with her thyroid since childhood; has a tumor on the left side; sometimes the left side swells, feels it getting swollen; gets really swollen at times; no change in voice, no trouble swallowing; mother had goiter; not as big as it sometimes gets; reviewed last thyroid US April 2017; Dr. Joycie Peek April 2018 note reviewed, she is scheduling thyroid US and will see her one week later; no other symptoms; just sees it in the mirror; weight pretty stable, but did gain a little; takes probiotics and no constipation; no excessive hair loss; tired all the time  Strong fam hx of diabetes; hx of prediabetes; dry mouth, might be allergy pills; keeps water bottle beside her  High cholesterol; dropped LDL from 166 to 89 and back up to 147; probably eats 3 eggs a month; not many fatty meats  Osteoporosis; managed at [facility]; good fall precautions  Depression screen [facility] 2/9 08/20/2016 02/07/2016 11/07/2015 08/05/2015 11/04/2014  Decreased Interest 0 0 0 0 0  Down, Depressed, Hopeless 0 0 0 0 0  PHQ - 2 Score 0 0 0 0 0   Relevant past medical, surgical, family and social history reviewed Past Medical History:  Diagnosis Date  . Allergic rhinitis   . Allergy   . Arthritis   . Bilateral ovarian cysts    laterality not known  . Bunion   . Cataract of both eyes   . Deviated septum   . Elevated BP   . Fibrocystic breast   . GERD (gastroesophageal reflux disease)   . Hammer toe   . History of closed fracture of nasal bones   . History of pneumonia    in her 33's  . Hyperlipidemia   . IFG (impaired  fasting glucose)   . Ocular rosacea    see's [facility]  . OSA (obstructive sleep apnea)   . Osteoarthritis cervical spine   . Osteoarthritis of both hands   . Osteoporosis   . Rosacea    telengiectatic  . Shingles    left eye  . Thyroid disease   . Thyroid nodule    left, sees Endocrinology  . Vitamin D deficiency disease    Past Surgical History:  Procedure Laterality Date  . BACK SURGERY     lumbar laminectomy  . BIOPSY THYROID    . BREAST CYST ASPIRATION Left   . BREAST SURGERY    . BUNIONECTOMY Right    Family History  Problem Relation Age of Onset  . Asthma Mother   . Hypertension Mother   . Diabetes Mother   . Cancer Mother     Leukemia  . Peripheral vascular disease Mother   . Osteoporosis Mother   . Glaucoma Mother   . Heart disease Father   . Early death Father   . Heart attack Father   . Hypertension Sister   . Diabetes Sister   . Glaucoma Sister   . Diabetes Maternal  Grandmother   . Colon cancer Maternal Uncle   . Lung cancer Paternal Uncle     Social History   Social History  . Marital status: Divorced    Spouse name: N/A  . Number of children: N/A  . Years of education: N/A   Occupational History  . Not on file.   Social History Main Topics  . Smoking status: Never Smoker  . Smokeless tobacco: Never Used  . Alcohol use No     Comment: occasional glass of wine  . Drug use: No  . Sexual activity: Not on file   Other Topics Concern  . Not on file   Social History Narrative  . No narrative on file   Interim medical history since last visit reviewed. Allergies and medications reviewed  Review of Systems Per HPI unless specifically indicated above     Objective:    BP 126/74   Pulse 95   Temp 97.4 F (36.3 C) (Oral)   Resp 14   Wt 118 lb 9.6 oz (53.8 kg)   SpO2 96%   BMI 24.79 kg/m   Wt Readings from Last 3 Encounters:  08/20/16 118 lb 9.6 oz (53.8 kg)  02/07/16 112 lb (50.8 kg)  11/07/15 112 lb 8 oz (51 kg)      Physical Exam  Constitutional: She appears well-developed and well-nourished. No distress.  Small-statured female, appears younger than stated age; no distress  HENT:  Head: Normocephalic and atraumatic.  Eyes: EOM are normal. Right eye exhibits no discharge. Left eye exhibits no discharge. No scleral icterus.  Neck: No thyromegaly present.    Fullness along upper aspect of LEFT side thyroid  Cardiovascular: Normal rate, regular rhythm and normal heart sounds.   No murmur heard. Pulmonary/Chest: Effort normal and breath sounds normal.  Abdominal: She exhibits no distension.  Musculoskeletal: Normal range of motion. She exhibits no edema.  Neurological: She is alert. She exhibits normal muscle tone.  Skin: Skin is warm and dry. She is not diaphoretic. No pallor.  Psychiatric: She has a normal mood and affect. Her behavior is normal. Her mood appears not anxious. She does not exhibit a depressed mood.    Results for orders placed or performed in visit on 02/14/16  Lipid panel  Result Value Ref Range   Cholesterol 236 (H) 125 - 200 mg/dL   Triglycerides 164 (H) <150 mg/dL   HDL 61 >=46 mg/dL   Total CHOL/HDL Ratio 3.9 <=5.0 Ratio   VLDL 33 (H) <30 mg/dL   LDL Cholesterol 142 (H) <130 mg/dL      Assessment & Plan:   Problem List Items Addressed This Visit      Respiratory   Allergic rhinitis    Avoid decongestants; plain antihistamines only recommended        Endocrine   Left thyroid nodule    Endo is scheduling thyroid US per her note      Relevant Medications   levothyroxine (SYNTHROID, LEVOTHROID) 75 MCG tablet   IFG (impaired fasting glucose)    Today is truly fasting; check glucose and A1c      Relevant Orders   Hemoglobin A1c   Hypothyroidism    Check labs today; going to have thyroid US and see endo soon      Relevant Medications   levothyroxine (SYNTHROID, LEVOTHROID) 75 MCG tablet   Other Relevant Orders   TSH     Other   Medication monitoring  encounter    Check labs  Relevant Orders   COMPLETE METABOLIC PANEL WITH GFR   Hyperlipidemia    Check today; encouraged low sat fat diet      Relevant Orders   Lipid panel       Follow up plan: No Follow-up on file.  An after-visit summary was printed and given to the patient at [place].  Please see the patient instructions which may contain other information and recommendations beyond what is mentioned above in the assessment and plan.  Meds ordered this encounter  Medications  . levothyroxine (SYNTHROID, LEVOTHROID) 75 MCG tablet    Sig: Take 1 tablet (75 mcg total) by mouth daily.    Dispense:  90 tablet    Refill:  3    Orders Placed This Encounter  Procedures  . Hemoglobin A1c  . COMPLETE METABOLIC PANEL WITH GFR  . TSH  . Lipid panel

## 2016-08-20 NOTE — Assessment & Plan Note (Signed)
Check labs 

## 2016-08-20 NOTE — Assessment & Plan Note (Signed)
Check today; encouraged low sat fat diet

## 2016-08-20 NOTE — Assessment & Plan Note (Signed)
Endo is scheduling thyroid US per her note

## 2016-08-20 NOTE — Assessment & Plan Note (Signed)
Check labs today; going to have thyroid US and see endo soon

## 2016-08-20 NOTE — Patient Instructions (Signed)
Try to limit saturated fats in your diet (bologna, hot dogs, barbeque, cheeseburgers, hamburgers, steak, bacon, sausage, cheese, etc.) and get more fresh fruits, vegetables, and whole grains Do see Dr. Gabriel Carina about your thyroid nodule and the swelling in your neck Return on or after July 28th for a Medicare Wellness visit

## 2016-08-20 NOTE — Assessment & Plan Note (Signed)
Avoid decongestants; plain antihistamines only recommended

## 2016-08-21 LAB — HEMOGLOBIN A1C
Hgb A1c MFr Bld: 5.2 % (ref <5.7)
Mean Plasma Glucose: 103 mg/dL

## 2016-08-29 DIAGNOSIS — E041 Nontoxic single thyroid nodule: Secondary | ICD-10-CM | POA: Diagnosis not present

## 2016-09-05 DIAGNOSIS — E041 Nontoxic single thyroid nodule: Secondary | ICD-10-CM | POA: Diagnosis not present

## 2016-09-19 ENCOUNTER — Encounter: Payer: Self-pay | Admitting: Family Medicine

## 2016-09-19 ENCOUNTER — Ambulatory Visit (INDEPENDENT_AMBULATORY_CARE_PROVIDER_SITE_OTHER): Payer: Medicare Other | Admitting: Family Medicine

## 2016-09-19 VITALS — BP 114/64 | HR 87 | Temp 97.5°F | Resp 14 | Wt 115.5 lb

## 2016-09-19 DIAGNOSIS — R350 Frequency of micturition: Secondary | ICD-10-CM

## 2016-09-19 DIAGNOSIS — Z5181 Encounter for therapeutic drug level monitoring: Secondary | ICD-10-CM | POA: Diagnosis not present

## 2016-09-19 LAB — POCT URINALYSIS DIPSTICK
Bilirubin, UA: NEGATIVE
Blood, UA: NEGATIVE
Glucose, UA: NEGATIVE
Ketones, UA: NEGATIVE
Leukocytes, UA: NEGATIVE
Nitrite, UA: NEGATIVE
Protein, UA: NEGATIVE
Spec Grav, UA: 1.015 (ref 1.010–1.025)
Urobilinogen, UA: 0.2 E.U./dL
pH, UA: 6 (ref 5.0–8.0)

## 2016-09-19 LAB — BASIC METABOLIC PANEL
BUN: 10 mg/dL (ref 7–25)
CO2: 28 mmol/L (ref 20–31)
Calcium: 9 mg/dL (ref 8.6–10.4)
Chloride: 104 mmol/L (ref 98–110)
Creat: 0.81 mg/dL (ref 0.60–0.93)
Glucose, Bld: 81 mg/dL (ref 65–99)
Potassium: 4.9 mmol/L (ref 3.5–5.3)
Sodium: 140 mmol/L (ref 135–146)

## 2016-09-19 NOTE — Patient Instructions (Signed)
Do continue to stay hydrated If you are not completely symptom-free by next week, call me and we'll get an ultrasound to look at your ovaries and kidneys

## 2016-09-19 NOTE — Assessment & Plan Note (Signed)
Check BUN and creatinine and GFR

## 2016-09-19 NOTE — Progress Notes (Signed)
BP 114/64   Pulse 87   Temp 97.5 F (36.4 C) (Oral)   Resp 14   Wt 115 lb 8 oz (52.4 kg)   SpO2 95%   BMI 24.14 kg/m    Subjective:    Patient ID: Angela Kelly, female    DOB: [birth year], 71 y.o.   MRN: [id number]  HPI: Angela Kelly is a 71 y.o. female  Chief Complaint  Patient presents with  . Urinary Tract Infection    Pressure on both sides of pt back. Freguent urination and swellen on both sides. Happen a month ago    HPI She is having a lot of pressure in her back; both sides; stomach swelled up; swelling in the neck was worse too; started drinking more water; urinated 14x yesterday; frequency; really drinking water; used to get bladder infections, but nothing recently; lost two pounds eliminating all the water through urine; urine is totally clear; no burning, no odor; she does not think she has a bladder infection; cut out coffee; she was having more abdominal pain yesterday with swelling; does still have ovaries; not typical back problems  Dermatologist reduced her dose of doxycycline, she dropped 50 mg daily and patient took twice a day the last two days; some relief  Pain in the upper thighs, in the muscles over the knees  Depression screen [facility] 2/9 09/19/2016 08/20/2016 02/07/2016 11/07/2015 08/05/2015  Decreased Interest 0 0 0 0 0  Down, Depressed, Hopeless 0 0 0 0 0  PHQ - 2 Score 0 0 0 0 0    Relevant past medical, surgical, family and social history reviewed Past Medical History:  Diagnosis Date  . Allergic rhinitis   . Allergy   . Arthritis   . Bilateral ovarian cysts    laterality not known  . Bunion   . Cataract of both eyes   . Deviated septum   . Elevated BP   . Fibrocystic breast   . GERD (gastroesophageal reflux disease)   . Hammer toe   . History of closed fracture of nasal bones   . History of pneumonia    in her 13's  . Hyperlipidemia   . IFG (impaired fasting glucose)   . Ocular rosacea    see's [facility]  . OSA (obstructive  sleep apnea)   . Osteoarthritis cervical spine   . Osteoarthritis of both hands   . Osteoporosis   . Rosacea    telengiectatic  . Shingles    left eye  . Thyroid disease   . Thyroid nodule    left, sees Endocrinology  . Vitamin D deficiency disease    Past Surgical History:  Procedure Laterality Date  . BACK SURGERY     lumbar laminectomy  . BIOPSY THYROID    . BREAST CYST ASPIRATION Left   . BREAST SURGERY    . BUNIONECTOMY Right    Family History  Problem Relation Age of Onset  . Asthma Mother   . Hypertension Mother   . Diabetes Mother   . Cancer Mother        Leukemia  . Peripheral vascular disease Mother   . Osteoporosis Mother   . Glaucoma Mother   . Heart disease Father   . Early death Father   . Heart attack Father   . Hypertension Sister   . Diabetes Sister   . Glaucoma Sister   . Diabetes Maternal Grandmother   . Colon cancer Maternal Uncle   . Lung cancer  Paternal Uncle    Social History   Social History  . Marital status: Divorced    Spouse name: N/A  . Number of children: N/A  . Years of education: N/A   Occupational History  . Not on file.   Social History Main Topics  . Smoking status: Never Smoker  . Smokeless tobacco: Never Used  . Alcohol use No     Comment: occasional glass of wine  . Drug use: No  . Sexual activity: Not on file   Other Topics Concern  . Not on file   Social History Narrative  . No narrative on file    Interim medical history since last visit reviewed. Allergies and medications reviewed  Review of Systems Per HPI unless specifically indicated above     Objective:    BP 114/64   Pulse 87   Temp 97.5 F (36.4 C) (Oral)   Resp 14   Wt 115 lb 8 oz (52.4 kg)   SpO2 95%   BMI 24.14 kg/m   Wt Readings from Last 3 Encounters:  09/19/16 115 lb 8 oz (52.4 kg)  08/20/16 118 lb 9.6 oz (53.8 kg)  02/07/16 112 lb (50.8 kg)    Physical Exam  Constitutional: She appears well-developed and well-nourished.    Neck: No JVD present.  Cardiovascular: Normal rate and regular rhythm.   Pulmonary/Chest: Effort normal and breath sounds normal.  Abdominal: She exhibits no distension. There is no tenderness.  Musculoskeletal: She exhibits no edema.  Lymphadenopathy:    She has no cervical adenopathy.    Results for orders placed or performed in visit on 09/19/16  POCT urinalysis dipstick  Result Value Ref Range   Color, UA light yellow    Clarity, UA clear    Glucose, UA neg    Bilirubin, UA neg    Ketones, UA neg    Spec Grav, UA 1.015 1.010 - 1.025   Blood, UA neg    pH, UA 6.0 5.0 - 8.0   Protein, UA neg    Urobilinogen, UA 0.2 0.2 or 1.0 E.U./dL   Nitrite, UA neg    Leukocytes, UA Negative Negative      Assessment & Plan:   Problem List Items Addressed This Visit      Other   Medication monitoring encounter    Check BUN and creatinine and GFR      Relevant Orders   Basic Metabolic Panel (BMET)    Other Visit Diagnoses    Frequent urination    -  Primary   check BMP, to look at glucose, GFR, creatinine; if not resolved in one week, call for [place] (ovaries and kidneys)   Relevant Orders   POCT urinalysis dipstick (Completed)   Basic Metabolic Panel (BMET)       Follow up plan: No Follow-up on file.  An after-visit summary was printed and given to the patient at [place].  Please see the patient instructions which may contain other information and recommendations beyond what is mentioned above in the assessment and plan.  Meds ordered this encounter  Medications  . mometasone (NASONEX) 50 MCG/ACT nasal spray    Sig: INSTILL 2 SPRAYS INTO EACH NOSTRIL DAILY    Refill:  3    Orders Placed This Encounter  Procedures  . Basic Metabolic Panel (BMET)  . POCT urinalysis dipstick

## 2016-11-02 ENCOUNTER — Encounter: Payer: Self-pay | Admitting: Family Medicine

## 2016-11-02 ENCOUNTER — Ambulatory Visit (INDEPENDENT_AMBULATORY_CARE_PROVIDER_SITE_OTHER): Payer: Medicare Other | Admitting: Family Medicine

## 2016-11-02 ENCOUNTER — Telehealth: Payer: Self-pay

## 2016-11-02 DIAGNOSIS — R7301 Impaired fasting glucose: Secondary | ICD-10-CM | POA: Diagnosis not present

## 2016-11-02 DIAGNOSIS — J3081 Allergic rhinitis due to animal (cat) (dog) hair and dander: Secondary | ICD-10-CM | POA: Diagnosis not present

## 2016-11-02 DIAGNOSIS — E782 Mixed hyperlipidemia: Secondary | ICD-10-CM | POA: Diagnosis not present

## 2016-11-02 DIAGNOSIS — E041 Nontoxic single thyroid nodule: Secondary | ICD-10-CM

## 2016-11-02 DIAGNOSIS — E039 Hypothyroidism, unspecified: Secondary | ICD-10-CM

## 2016-11-02 NOTE — Patient Instructions (Signed)
Try to limit or avoid triggers like coffee, caffeinated beverages, onions, chocolate, spicy foods, green and red peppers, peppermint, acidic foods like pizza, spaghetti sauce, and orange juice Try to not eat for 3 hours before bedtime Try elevating the head of your bed by placing a small wedge between your mattress and box springs to keep acid in the stomach at night instead of coming up into your esophagus Let me know if symptoms worsen and we can get an ultrasound

## 2016-11-02 NOTE — Assessment & Plan Note (Signed)
Patient should only be on one nasal corticosteroid

## 2016-11-02 NOTE — Assessment & Plan Note (Signed)
Monitored by endocrinologist; reviewed her last note; it looks like the next scan will be done by her in 18 months

## 2016-11-02 NOTE — Assessment & Plan Note (Signed)
Check fasting lipids in November; healthy eating encouraged

## 2016-11-02 NOTE — Assessment & Plan Note (Signed)
Reviewed previous A1c readings with her; will check again in November

## 2016-11-02 NOTE — Progress Notes (Signed)
BP 114/62   Pulse 84   Temp 97.9 F (36.6 C) (Oral)   Resp 14   Wt 119 lb 3.2 oz (54.1 kg)   SpO2 98%   BMI 24.91 kg/m    Subjective:    Patient ID: Angela Kelly, female    DOB: [birth year], 71 y.o.   MRN: [id number]  HPI: Angela Kelly is a 71 y.o. female  Chief Complaint  Patient presents with  . Follow-up    HPI Patient is here for f/u She is feeling better than the last time  She was having issues with her kidneys She is having some lower back pain on the right side; stopped coffee altogether and switched to green tea; read on the internet that you don't want more than 40 ounces of green tea Drinking plenty of water A little bit of blurred vision; just had her eye exam and needs new glasses Urinates all day, it goes right through her; no burning; no odor, no blood; pale yellow  Allergies are okay; taking benadryl  Hypothyroidism; just had that checked a few months ago; turned her loose for a year or year and a half; hasn't grown at all Saw Dr. Gabriel Carina in May; stable 0.9 cm nodule on the LEFT; repeat US in 18 months per her note and what patient was told; she actually checked out the soft tissue below the thyroid; not tender, has gone down; she stopped the doxy and got the blisters on her eyes, two giant zits like she was 14 years ago  She has been having some burping when she eats certain foods; sister and mother had gallbladder disease; she is trying to avoid fatty foods; eats chicken and [place] and some times pork loin; no beef; no straws; no carbonated drinks; depends on what she eats, bell peppers; using tums and it goes away; not too bad, just wanted to mention; does not want [place] (offered today) and she'll just watch  Hx of prediabetes; reviewed last 4 A1c readings with her; last two have been in normal range  Depression screen [facility] 2/9 11/02/2016 09/19/2016 08/20/2016 02/07/2016 11/07/2015  Decreased Interest 0 0 0 0 0  Down, Depressed, Hopeless 0 0 0 0 0  PHQ - 2 Score  0 0 0 0 0   Relevant past medical, surgical, family and social history reviewed Past Medical History:  Diagnosis Date  . Allergic rhinitis   . Allergy   . Arthritis   . Bilateral ovarian cysts    laterality not known  . Bunion   . Cataract of both eyes   . Deviated septum   . Elevated BP   . Fibrocystic breast   . GERD (gastroesophageal reflux disease)   . Hammer toe   . History of closed fracture of nasal bones   . History of pneumonia    in her 39's  . Hyperlipidemia   . IFG (impaired fasting glucose)   . Ocular rosacea    see's [facility]  . OSA (obstructive sleep apnea)   . Osteoarthritis cervical spine   . Osteoarthritis of both hands   . Osteoporosis   . Rosacea    telengiectatic  . Shingles    left eye  . Thyroid disease   . Thyroid nodule    left, sees Endocrinology  . Vitamin D deficiency disease    Past Surgical History:  Procedure Laterality Date  . BACK SURGERY     lumbar laminectomy  . BIOPSY THYROID    .  BREAST CYST ASPIRATION Left   . BREAST SURGERY    . BUNIONECTOMY Right    Family History  Problem Relation Age of Onset  . Asthma Mother   . Hypertension Mother   . Diabetes Mother   . Cancer Mother        Leukemia  . Peripheral vascular disease Mother   . Osteoporosis Mother   . Glaucoma Mother   . Heart disease Father   . Early death Father   . Heart attack Father   . Hypertension Sister   . Diabetes Sister   . Glaucoma Sister   . Diabetes Maternal Grandmother   . Colon cancer Maternal Uncle   . Lung cancer Paternal Uncle    Social History   Social History  . Marital status: Divorced    Spouse name: N/A  . Number of children: N/A  . Years of education: N/A   Occupational History  . Not on file.   Social History Main Topics  . Smoking status: Never Smoker  . Smokeless tobacco: Never Used  . Alcohol use 0.0 oz/week     Comment: occasional glass of wine  . Drug use: No  . Sexual activity: Not Currently   Other  Topics Concern  . Not on file   Social History Narrative  . No narrative on file    Interim medical history since last visit reviewed. Allergies and medications reviewed  Review of Systems Per HPI unless specifically indicated above     Objective:    BP 114/62   Pulse 84   Temp 97.9 F (36.6 C) (Oral)   Resp 14   Wt 119 lb 3.2 oz (54.1 kg)   SpO2 98%   BMI 24.91 kg/m   Wt Readings from Last 3 Encounters:  11/02/16 119 lb 3.2 oz (54.1 kg)  09/19/16 115 lb 8 oz (52.4 kg)  08/20/16 118 lb 9.6 oz (53.8 kg)    Physical Exam  Constitutional: No distress.  Small-statured, petite woman, no distress  Eyes: EOM are normal. No scleral icterus.  Neck: No JVD present.  Soft tissue swelling consistent with fat pad above sternoclavicular notch  Cardiovascular: Normal rate and regular rhythm.   Pulmonary/Chest: Effort normal and breath sounds normal.  Abdominal: She exhibits no distension.  Musculoskeletal: She exhibits no edema.  Neurological: She is alert. She displays no tremor.  Skin: Skin is warm. No pallor.  Psychiatric: She has a normal mood and affect.   Results for orders placed or performed in visit on 17/61/60  Basic Metabolic Panel (BMET)  Result Value Ref Range   Sodium 140 135 - 146 mmol/L   Potassium 4.9 3.5 - 5.3 mmol/L   Chloride 104 98 - 110 mmol/L   CO2 28 20 - 31 mmol/L   Glucose, Bld 81 65 - 99 mg/dL   BUN 10 7 - 25 mg/dL   Creat 0.81 0.60 - 0.93 mg/dL   Calcium 9.0 8.6 - 10.4 mg/dL  POCT urinalysis dipstick  Result Value Ref Range   Color, UA light yellow    Clarity, UA clear    Glucose, UA neg    Bilirubin, UA neg    Ketones, UA neg    Spec Grav, UA 1.015 1.010 - 1.025   Blood, UA neg    pH, UA 6.0 5.0 - 8.0   Protein, UA neg    Urobilinogen, UA 0.2 0.2 or 1.0 E.U./dL   Nitrite, UA neg    Leukocytes, UA Negative Negative  Assessment & Plan:   Problem List Items Addressed This Visit      Endocrine   Left thyroid nodule     Monitored by endocrinologist; reviewed her last note; it looks like the next scan will be done by her in 18 months      IFG (impaired fasting glucose)    Reviewed previous A1c readings with her; will check again in November      Relevant Orders   Hemoglobin A1c   Hypothyroidism    Very mild weight gain; seeing endo for the nodule, but we are monitoring labs here; will check in Nov      Relevant Orders   TSH     Other   Hyperlipidemia    Check fasting lipids in November; healthy eating encouraged      Relevant Orders   Lipid panel       Follow up plan: Return in about 4 months (around 02/21/2017) for fasting labs.  An after-visit summary was printed and given to the patient at [place].  Please see the patient instructions which may contain other information and recommendations beyond what is mentioned above in the assessment and plan.  Meds ordered this encounter  Medications  . Cranberry 1000 MG CAPS    Sig: Take by mouth.    Orders Placed This Encounter  Procedures  . TSH  . Lipid panel  . Hemoglobin A1c

## 2016-11-02 NOTE — Assessment & Plan Note (Signed)
Very mild weight gain; seeing endo for the nodule, but we are monitoring labs here; will check in Nov

## 2016-11-02 NOTE — Telephone Encounter (Signed)
Pt has two nasal sprays on med list. Called to see which one she is taking since she only suppose to take one.  Left her a message to see which one so we can correct her med list.

## 2016-11-14 ENCOUNTER — Encounter: Payer: Self-pay | Admitting: Family Medicine

## 2016-11-14 ENCOUNTER — Ambulatory Visit (INDEPENDENT_AMBULATORY_CARE_PROVIDER_SITE_OTHER): Payer: Medicare Other | Admitting: Family Medicine

## 2016-11-14 VITALS — BP 130/70 | HR 94 | Temp 98.5°F | Resp 16 | Ht <= 58 in | Wt 117.4 lb

## 2016-11-14 DIAGNOSIS — L249 Irritant contact dermatitis, unspecified cause: Secondary | ICD-10-CM

## 2016-11-14 MED ORDER — TRIAMCINOLONE ACETONIDE 0.1 % EX CREA: 1.0000 "application " | TOPICAL_CREAM | Freq: Two times a day (BID) | CUTANEOUS | 0 refills | Status: DC

## 2016-11-14 NOTE — Progress Notes (Addendum)
Name: Angela Kelly   MRN: [id number]    DOB: [birth year]   Date:11/14/2016       Progress Note  Subjective  Chief Complaint  Chief Complaint  Patient presents with  . Rash    possible shingles left arm, red, itching, no pain for 3 days    HPI  She presents with rash to LEFT anterior distal forearm x3 days. She says she has very sensitive skin. Takes Doxycycline 50mg  Daily for rosacea for the last year.  Has history of shingles many years ago while pregnant - lost vision in left eye because of this, and wants to make sure that she doesn't have shingles.  Endorses itching but no pain. No bleeding or drainage.  No changes, detergents, lotions, shampoos, body wash, etc. She does have two dogs that go in and out, and she wonders if they could have exposed her to something. She does garden a little bit too. No fevers/chills, no recent tick contact, nausea, vomiting, fatigue/malaise, body aches. Does take benadryl twice daily.  Patient Active Problem List   Diagnosis Date Noted  . Need for hepatitis C screening test 11/07/2015  . Preventative health care 11/07/2015  . Nausea 10/27/2015  . Medication monitoring encounter 08/05/2015  . Abnormal mammogram of left breast 01/18/2015  . OSA (obstructive sleep apnea)   . Hyperlipidemia   . Thyroid disease   . Cataract of both eyes   . Ocular rosacea   . Bunion   . Hammer toe   . Allergic rhinitis   . Hypothyroidism 08/03/2014  . Left thyroid nodule 08/03/2014  . IFG (impaired fasting glucose) 08/03/2014  . Osteoporosis 08/03/2014    Social History  Substance Use Topics  . Smoking status: Never Smoker  . Smokeless tobacco: Never Used  . Alcohol use 0.0 oz/week     Comment: occasional glass of wine     Current Outpatient Prescriptions:  .  aspirin EC 81 MG tablet, Take 1 tablet (81 mg total) by mouth daily., Disp: 30 tablet, Rfl: 11 .  B Complex-C (SUPER B COMPLEX PO), Take 1 capsule by mouth daily. Reported on 10/27/2015, Disp: , Rfl:   .  beta carotene w/minerals (OCUVITE) tablet, Take 1 tablet by mouth daily. Reported on 10/27/2015, Disp: , Rfl:  .  calcium carbonate (OS-CAL) 600 MG TABS tablet, Take 600 mg by mouth 2 (two) times daily with a meal. Reported on 10/27/2015, Disp: , Rfl:  .  cholecalciferol (VITAMIN D) 1000 UNITS tablet, Take 1,000 Units by mouth daily. Reported on 10/27/2015, Disp: , Rfl:  .  ciclesonide (OMNARIS) 50 MCG/ACT nasal spray, Place 2 sprays into both nostrils daily. Reported on 10/27/2015, Disp: , Rfl:  .  Cranberry 1000 MG CAPS, Take by mouth., Disp: , Rfl:  .  diphenhydrAMINE (BENADRYL) 25 mg capsule, Take 25 mg by mouth every 4 (four) hours as needed. Reported on 10/27/2015, Disp: , Rfl:  .  doxycycline (VIBRAMYCIN) 50 MG capsule, Take 50 mg by mouth daily. , Disp: , Rfl: 3 .  Ivermectin (SOOLANTRA) 1 % CREA, Apply topically. Reported on 10/27/2015, Disp: , Rfl:  .  levothyroxine (SYNTHROID, LEVOTHROID) 75 MCG tablet, Take 1 tablet (75 mcg total) by mouth daily., Disp: 90 tablet, Rfl: 3 .  Lysine 1000 MG TABS, Take 1 tablet by mouth as needed. Reported on 10/27/2015, Disp: , Rfl:  .  mometasone (NASONEX) 50 MCG/ACT nasal spray, INSTILL 2 SPRAYS INTO EACH NOSTRIL DAILY, Disp: , Rfl: 3 .  Multiple Vitamin (MULTIVITAMIN)  capsule, Take 1 capsule by mouth daily. Reported on 10/27/2015, Disp: , Rfl:  .  Omega-3 Fatty Acids (FISH OIL PO), Take 2 capsules by mouth daily. Reported on 10/27/2015, Disp: , Rfl:  .  PAZEO 0.7 % SOLN, Place 1 drop into both eyes daily. Reported on 10/27/2015, Disp: , Rfl: 5 .  Probiotic Product (PROBIOTIC DAILY PO), Take 1 capsule by mouth 2 (two) times daily. Reported on 10/27/2015, Disp: , Rfl:  .  vitamin B-12 (CYANOCOBALAMIN) 100 MCG tablet, Take 100 mcg by mouth daily. Reported on 10/27/2015, Disp: , Rfl:  .  vitamin C (ASCORBIC ACID) 500 MG tablet, Take 500 mg by mouth daily. Reported on 10/27/2015, Disp: , Rfl:  .  vitamin E 400 UNIT capsule, Take 400 Units by mouth daily. Reported on  10/27/2015, Disp: , Rfl:   Allergies  Allergen Reactions  . Codeine Other (See Comments)    Ears ringing  . Lovastatin Rash  . Nickel Rash  . Tape Rash    ROS  Constitutional: Negative for fever or weight change.  Respiratory: Negative for cough and shortness of breath.   Cardiovascular: Negative for chest pain or palpitations.  Gastrointestinal: Negative for abdominal pain, no bowel changes.  Musculoskeletal: Negative for gait problem or joint swelling.  Skin: Positive for rash.  Neurological: Negative for dizziness or headache.  No other specific complaints in a complete review of systems (except as listed in HPI above).  Objective  Vitals:   11/14/16 1529  BP: 130/70  Pulse: 94  Resp: 16  Temp: 98.5 F (36.9 C)  TempSrc: Oral  SpO2: 96%  Weight: 117 lb 6.4 oz (53.3 kg)  Height: 4\' 10"  (1.473 m)   Body mass index is 24.54 kg/m.  Nursing Note and Vital Signs reviewed.  Physical Exam  Constitutional: Patient appears well-developed and well-nourished.  No distress.  HEENT: head atraumatic, normocephalic Cardiovascular: Normal rate, regular rhythm, S1/S2 present.  No murmur or rub heard. No BLE edema. Pulmonary/Chest: Effort normal and breath sounds clear. No respiratory distress or retractions. Psychiatric: Patient has a normal mood and affect. behavior is normal. Judgment and thought content normal. Skin: LEFT anterior distal forearm has streak of maculopapular, erythematous rash with excoriation present; in the center of this streak is a single raised, non-tender erythematous welt . No bulla or crusting, no exudate or bleeding.   Recent Results (from the past 2160 hour(s))  Hemoglobin A1c     Status: None   Collection Time: 08/20/16  9:43 AM  Result Value Ref Range   Hgb A1c MFr Bld 5.2 <5.7 %    Comment:   For the purpose of screening for the presence of diabetes:   <5.7%       Consistent with the absence of diabetes 5.7-6.4 %   Consistent with increased  risk for diabetes (prediabetes) >=6.5 %     Consistent with diabetes   This assay result is consistent with a decreased risk of diabetes.   Currently, no consensus exists regarding use of hemoglobin A1c for diagnosis of diabetes in children.   According to American Diabetes Association (ADA) guidelines, hemoglobin A1c <7.0% represents optimal control in non-pregnant diabetic patients. Different metrics may apply to specific patient populations. Standards of Medical Care in Diabetes (ADA).      Mean Plasma Glucose 103 mg/dL  COMPLETE METABOLIC PANEL WITH GFR     Status: Abnormal   Collection Time: 08/20/16  9:43 AM  Result Value Ref Range   Sodium 139 135 -  146 mmol/L   Potassium 4.8 3.5 - 5.3 mmol/L   Chloride 103 98 - 110 mmol/L   CO2 32 (H) 20 - 31 mmol/L   Glucose, Bld 85 65 - 99 mg/dL   BUN 13 7 - 25 mg/dL   Creat 0.97 (H) 0.60 - 0.93 mg/dL    Comment:   For patients > or = 73 years of age: The upper reference limit for Creatinine is approximately 13% higher for people identified as African-American.      Total Bilirubin 0.4 0.2 - 1.2 mg/dL   Alkaline Phosphatase 79 33 - 130 U/L   AST 25 10 - 35 U/L   ALT 16 6 - 29 U/L   Total Protein 6.2 6.1 - 8.1 g/dL   Albumin 3.8 3.6 - 5.1 g/dL   Calcium 9.0 8.6 - 10.4 mg/dL   GFR, Est African American 67 >=60 mL/min   GFR, Est Non African American 58 (L) >=60 mL/min  TSH     Status: None   Collection Time: 08/20/16  9:43 AM  Result Value Ref Range   TSH 0.42 mIU/L    Comment:   Reference Range   > or = 20 Years  0.40-4.50   Pregnancy Range First trimester  0.26-2.66 Second trimester 0.55-2.73 Third trimester  0.43-2.91     Lipid panel     Status: Abnormal   Collection Time: 08/20/16  9:43 AM  Result Value Ref Range   Cholesterol 209 (H) <200 mg/dL   Triglycerides 125 <150 mg/dL   HDL 64 >50 mg/dL   Total CHOL/HDL Ratio 3.3 <5.0 Ratio   VLDL 25 <30 mg/dL   LDL Cholesterol 120 (H) <100 mg/dL  POCT urinalysis  dipstick     Status: Normal   Collection Time: 09/19/16  8:13 AM  Result Value Ref Range   Color, UA light yellow    Clarity, UA clear    Glucose, UA neg    Bilirubin, UA neg    Ketones, UA neg    Spec Grav, UA 1.015 1.010 - 1.025   Blood, UA neg    pH, UA 6.0 5.0 - 8.0   Protein, UA neg    Urobilinogen, UA 0.2 0.2 or 1.0 E.U./dL   Nitrite, UA neg    Leukocytes, UA Negative Negative  Basic Metabolic Panel (BMET)     Status: None   Collection Time: 09/19/16  8:29 AM  Result Value Ref Range   Sodium 140 135 - 146 mmol/L   Potassium 4.9 3.5 - 5.3 mmol/L   Chloride 104 98 - 110 mmol/L   CO2 28 20 - 31 mmol/L   Glucose, Bld 81 65 - 99 mg/dL   BUN 10 7 - 25 mg/dL   Creat 0.81 0.60 - 0.93 mg/dL    Comment:   For patients > or = 73 years of age: The upper reference limit for Creatinine is approximately 13% higher for people identified as African-American.      Calcium 9.0 8.6 - 10.4 mg/dL     Assessment & Plan  1. Irritant contact dermatitis, unspecified trigger - triamcinolone cream (KENALOG) 0.1 %; Apply 1 application topically 2 (two) times daily.  Dispense: 30 g; Refill: 0 - Advised likely an irritant contact dermatitis from unknown trigger, unlikely to be shingles based on presentation. Recommend she take TID dosing of benadryl instead of BID dosing if itching is not well controlled with cream. - Advised to monitor triggers and note if anything makes this worse or better.  Follow up as needed. -Red flags and when to present for emergency care or RTC including fever >101.10F, chest pain, shortness of breath, new/worsening/un-resolving symptoms, reviewed with patient at time of visit. Follow up and care instructions discussed and provided in AVS.  I have reviewed this encounter including the documentation in this note and/or discussed this patient with the Johney Maine, FNP, NP-C. I am certifying that I agree with the content of this note as supervising  physician.  Steele Sizer, MD [facility] 11/14/2016, 9:40 PM

## 2016-11-14 NOTE — Patient Instructions (Addendum)
Contact Dermatitis Dermatitis is redness, soreness, and swelling (inflammation) of the skin. Contact dermatitis is a reaction to certain substances that touch the skin. You either touched something that irritated your skin, or you have allergies to something you touched. Follow these instructions at home: Skin Care  Moisturize your skin as needed.  Apply cool compresses to the affected areas.  Try taking a bath with: ? Epsom salts. Follow the instructions on the package. You can get these at a pharmacy or grocery store. ? Baking soda. Pour a small amount into the bath as told by your doctor. ? Colloidal oatmeal. Follow the instructions on the package. You can get this at a pharmacy or grocery store.  Try applying baking soda paste to your skin. Stir water into baking soda until it looks like paste.  Do not scratch your skin.  Bathe less often.  Bathe in lukewarm water. Avoid using hot water. Medicines  Take or apply over-the-counter and prescription medicines only as told by your doctor.  If you were prescribed an antibiotic medicine, take or apply your antibiotic as told by your doctor. Do not stop taking the antibiotic even if your condition starts to get better. General instructions  Keep all follow-up visits as told by your doctor. This is important.  Avoid the substance that caused your reaction. If you do not know what caused it, keep a journal to try to track what caused it. Write down: ? What you eat. ? What cosmetic products you use. ? What you drink. ? What you wear in the affected area. This includes jewelry.  If you were given a bandage (dressing), take care of it as told by your doctor. This includes when to change and remove it. Contact a doctor if:  You do not get better with treatment.  Your condition gets worse.  You have signs of infection such as: ? Swelling. ? Tenderness. ? Redness. ? Soreness. ? Warmth.  You have a fever.  You have new  symptoms. Get help right away if:  You have a very bad headache.  You have neck pain.  Your neck is stiff.  You throw up (vomit).  You feel very sleepy.  You see red streaks coming from the affected area.  Your bone or joint underneath the affected area becomes painful after the skin has healed.  The affected area turns darker.  You have trouble breathing. This information is not intended to replace advice given to you by your health care provider. Make sure you discuss any questions you have with your health care provider. Document Released: 01/28/2009 Document Revised: 09/08/2015 Document Reviewed: 08/18/2014 Elsevier Interactive Patient Education  2018 [employer]  

## 2016-12-03 DIAGNOSIS — M2042 Other hammer toe(s) (acquired), left foot: Secondary | ICD-10-CM | POA: Diagnosis not present

## 2016-12-03 DIAGNOSIS — M2012 Hallux valgus (acquired), left foot: Secondary | ICD-10-CM | POA: Diagnosis not present

## 2016-12-03 DIAGNOSIS — M205X2 Other deformities of toe(s) (acquired), left foot: Secondary | ICD-10-CM | POA: Diagnosis not present

## 2016-12-03 DIAGNOSIS — M79672 Pain in left foot: Secondary | ICD-10-CM | POA: Diagnosis not present

## 2016-12-18 ENCOUNTER — Other Ambulatory Visit: Payer: Self-pay

## 2016-12-18 DIAGNOSIS — Z13820 Encounter for screening for osteoporosis: Secondary | ICD-10-CM

## 2016-12-18 DIAGNOSIS — M810 Age-related osteoporosis without current pathological fracture: Secondary | ICD-10-CM

## 2017-01-24 ENCOUNTER — Ambulatory Visit: Admit: 2017-01-24 | Payer: Medicare Other | Admitting: Podiatry

## 2017-01-24 SURGERY — BUNIONECTOMY, LAPIDUS
Anesthesia: Choice | Laterality: Left

## 2017-01-30 ENCOUNTER — Encounter: Payer: Self-pay | Admitting: Family Medicine

## 2017-01-30 ENCOUNTER — Ambulatory Visit
Admission: RE | Admit: 2017-01-30 | Discharge: 2017-01-30 | Disposition: A | Discharge status: Home / Self Care | Payer: Medicare Other | Source: Ambulatory Visit | Attending: Family Medicine | Admitting: Family Medicine

## 2017-01-30 DIAGNOSIS — M85851 Other specified disorders of bone density and structure, right thigh: Secondary | ICD-10-CM | POA: Insufficient documentation

## 2017-01-30 DIAGNOSIS — Z13820 Encounter for screening for osteoporosis: Secondary | ICD-10-CM | POA: Diagnosis not present

## 2017-01-30 DIAGNOSIS — M810 Age-related osteoporosis without current pathological fracture: Secondary | ICD-10-CM

## 2017-02-07 DIAGNOSIS — L578 Other skin changes due to chronic exposure to nonionizing radiation: Secondary | ICD-10-CM | POA: Diagnosis not present

## 2017-02-07 DIAGNOSIS — L718 Other rosacea: Secondary | ICD-10-CM | POA: Diagnosis not present

## 2017-02-07 DIAGNOSIS — L821 Other seborrheic keratosis: Secondary | ICD-10-CM | POA: Diagnosis not present

## 2017-02-21 ENCOUNTER — Encounter: Payer: Self-pay | Admitting: Family Medicine

## 2017-02-21 ENCOUNTER — Other Ambulatory Visit: Payer: Self-pay

## 2017-02-21 ENCOUNTER — Ambulatory Visit (INDEPENDENT_AMBULATORY_CARE_PROVIDER_SITE_OTHER): Payer: Medicare Other | Admitting: Family Medicine

## 2017-02-21 VITALS — BP 130/72 | HR 87 | Temp 97.8°F | Resp 14 | Wt 115.9 lb

## 2017-02-21 DIAGNOSIS — H269 Unspecified cataract: Secondary | ICD-10-CM | POA: Diagnosis not present

## 2017-02-21 DIAGNOSIS — M85859 Other specified disorders of bone density and structure, unspecified thigh: Secondary | ICD-10-CM | POA: Diagnosis not present

## 2017-02-21 DIAGNOSIS — E782 Mixed hyperlipidemia: Secondary | ICD-10-CM

## 2017-02-21 DIAGNOSIS — J3081 Allergic rhinitis due to animal (cat) (dog) hair and dander: Secondary | ICD-10-CM

## 2017-02-21 DIAGNOSIS — Z1239 Encounter for other screening for malignant neoplasm of breast: Secondary | ICD-10-CM

## 2017-02-21 DIAGNOSIS — Z1231 Encounter for screening mammogram for malignant neoplasm of breast: Secondary | ICD-10-CM

## 2017-02-21 DIAGNOSIS — L718 Other rosacea: Secondary | ICD-10-CM

## 2017-02-21 DIAGNOSIS — E039 Hypothyroidism, unspecified: Secondary | ICD-10-CM

## 2017-02-21 DIAGNOSIS — R7301 Impaired fasting glucose: Secondary | ICD-10-CM

## 2017-02-21 DIAGNOSIS — Z23 Encounter for immunization: Secondary | ICD-10-CM | POA: Diagnosis not present

## 2017-02-21 NOTE — Assessment & Plan Note (Signed)
Checking TSH today; previous endo note reviewed

## 2017-02-21 NOTE — Assessment & Plan Note (Signed)
Back on nasal corticosteroid

## 2017-02-21 NOTE — Assessment & Plan Note (Signed)
Check A1c; strong fam hx of diabetes

## 2017-02-21 NOTE — Assessment & Plan Note (Signed)
Monitor lipids

## 2017-02-21 NOTE — Assessment & Plan Note (Signed)
Under management of eye specialist

## 2017-02-21 NOTE — Assessment & Plan Note (Signed)
Fall risk, calcium intake; reviewed last DEXA report with her; spine normal, hip osteopenic

## 2017-02-21 NOTE — Patient Instructions (Addendum)
[place] desk -- please request a copy of the colonoscopy report from 2015, along with any path report (if polyp was removed) You received the flu shot today; it should protect you against the flu virus over the coming months; it will take about two weeks for antibodies to develop; do try to stay away from hospitals, nursing homes, and daycares during peak flu season; taking extra vitamin C daily during flu season may help you avoid getting sick  Fall Prevention in the Home Falls can cause injuries. They can happen to people of all ages. There are many things you can do to make your home safe and to help prevent falls. What can I do on the outside of my home?  Regularly fix the edges of walkways and driveways and fix any cracks.  Remove anything that might make you trip as you walk through a door, such as a raised step or threshold.  Trim any bushes or trees on the path to your home.  Use bright outdoor lighting.  Clear any walking paths of anything that might make someone trip, such as rocks or tools.  Regularly check to see if handrails are loose or broken. Make sure that both sides of any steps have handrails.  Any raised decks and porches should have guardrails on the edges.  Have any leaves, snow, or ice cleared regularly.  Use sand or salt on walking paths during winter.  Clean up any spills in your garage right away. This includes oil or grease spills. What can I do in the bathroom?  Use night lights.  Install grab bars by the toilet and in the tub and shower. Do not use towel bars as grab bars.  Use non-skid mats or decals in the tub or shower.  If you need to sit down in the shower, use a plastic, non-slip stool.  Keep the floor dry. Clean up any water that spills on the floor as soon as it happens.  Remove soap buildup in the tub or shower regularly.  Attach bath mats securely with double-sided non-slip rug tape.  Do not have throw rugs and other things on the floor  that can make you trip. What can I do in the bedroom?  Use night lights.  Make sure that you have a light by your bed that is easy to reach.  Do not use any sheets or blankets that are too big for your bed. They should not hang down onto the floor.  Have a firm chair that has side arms. You can use this for support while you get dressed.  Do not have throw rugs and other things on the floor that can make you trip. What can I do in the kitchen?  Clean up any spills right away.  Avoid walking on wet floors.  Keep items that you use a lot in easy-to-reach places.  If you need to reach something above you, use a strong step stool that has a grab bar.  Keep electrical cords out of the way.  Do not use floor polish or wax that makes floors slippery. If you must use wax, use non-skid floor wax.  Do not have throw rugs and other things on the floor that can make you trip. What can I do with my stairs?  Do not leave any items on the stairs.  Make sure that there are handrails on both sides of the stairs and use them. Fix handrails that are broken or loose. Make sure that handrails  are as long as the stairways.  Check any carpeting to make sure that it is firmly attached to the stairs. Fix any carpet that is loose or worn.  Avoid having throw rugs at the top or bottom of the stairs. If you do have throw rugs, attach them to the floor with carpet tape.  Make sure that you have a light switch at the top of the stairs and the bottom of the stairs. If you do not have them, ask someone to add them for you. What else can I do to help prevent falls?  Wear shoes that: ? Do not have high heels. ? Have rubber bottoms. ? Are comfortable and fit you well. ? Are closed at the toe. Do not wear sandals.  If you use a stepladder: ? Make sure that it is fully opened. Do not climb a closed stepladder. ? Make sure that both sides of the stepladder are locked into place. ? Ask someone to hold it  for you, if possible.  Clearly mark and make sure that you can see: ? Any grab bars or handrails. ? First and last steps. ? Where the edge of each step is.  Use tools that help you move around (mobility aids) if they are needed. These include: ? Canes. ? Walkers. ? Scooters. ? Crutches.  Turn on the lights when you go into a dark area. Replace any light bulbs as soon as they burn out.  Set up your furniture so you have a clear path. Avoid moving your furniture around.  If any of your floors are uneven, fix them.  If there are any pets around you, be aware of where they are.  Review your medicines with your doctor. Some medicines can make you feel dizzy. This can increase your chance of falling. Ask your doctor what other things that you can do to help prevent falls. This information is not intended to replace advice given to you by your health care provider. Make sure you discuss any questions you have with your health care provider. Document Released: 01/27/2009 Document Revised: 09/08/2015 Document Reviewed: 05/07/2014 Elsevier Interactive Patient Education  [employer].

## 2017-02-21 NOTE — Progress Notes (Signed)
BP 130/72   Pulse 87   Temp 97.8 F (36.6 C) (Oral)   Resp 14   Wt 115 lb 14.4 oz (52.6 kg)   SpO2 98%   BMI 24.22 kg/m    Subjective:    Patient ID: Angela Kelly, female    DOB: 12/31/1945, 71 y.o.   MRN: [id number]  HPI: Angela Kelly is a 71 y.o. female  Chief Complaint  Patient presents with  . Follow-up    HPI She is here for f/u Every thing is pretty good right now Just maintaining, no complaints Last colonscopy was 2015, but patient Strong fam hx of diabetes; vision is so bad, it's hard to say if any blurriness; cataracts are getting worse; they are watching that; Dr. Sandra Cockayne We reviewed her last labs Drinking green tea, very potent; gave up coffee; limiting to under 48 ounces a day; pretty good water drinker Nasal spray; the allergies are bad again; just got back on the nasal corticosteroid She is due for thyroid labs too; used to see Dr. Howell Rucks and then Dr. Gabriel Carina She was getting a rash from the metal from her clipboard; saw colleague and used TAC; better now Mammogram due next week Just had DEXA scan; osteopenia; 13.1% hip fracture risk; 3.1% major osteoporotic fracture; taking calcium, drinks almond milk  Depression screen [facility] 2/9 02/21/2017 11/02/2016 09/19/2016 08/20/2016 02/07/2016  Decreased Interest 0 0 0 0 0  Down, Depressed, Hopeless 0 0 0 0 0  PHQ - 2 Score 0 0 0 0 0    Relevant past medical, surgical, family and social history reviewed Past Medical History:  Diagnosis Date  . Allergic rhinitis   . Allergy   . Arthritis   . Bilateral ovarian cysts    laterality not known  . Bunion   . Cataract of both eyes   . Deviated septum   . Elevated BP   . Fibrocystic breast   . GERD (gastroesophageal reflux disease)   . Hammer toe   . History of closed fracture of nasal bones   . History of pneumonia    in her 36's  . Hyperlipidemia   . IFG (impaired fasting glucose)   . Ocular rosacea    see's [facility]  . OSA (obstructive sleep  apnea)   . Osteoarthritis cervical spine   . Osteoarthritis of both hands   . Osteoporosis   . Rosacea    telengiectatic  . Shingles    left eye  . Thyroid disease   . Thyroid nodule    left, sees Endocrinology  . Vitamin D deficiency disease    Past Surgical History:  Procedure Laterality Date  . BACK SURGERY     lumbar laminectomy  . BIOPSY THYROID    . BREAST CYST ASPIRATION Left   . BREAST SURGERY    . BUNIONECTOMY Right    Family History  Problem Relation Age of Onset  . Asthma Mother   . Hypertension Mother   . Diabetes Mother   . Cancer Mother        Leukemia  . Peripheral vascular disease Mother   . Osteoporosis Mother   . Glaucoma Mother   . Heart disease Father   . Early death Father   . Heart attack Father   . Hypertension Sister   . Diabetes Sister   . Glaucoma Sister   . Diabetes Maternal Grandmother   . Colon cancer Maternal Uncle   . Lung cancer Paternal Uncle  Social History   Socioeconomic History  . Marital status: Divorced    Spouse name: Not on file  . Number of children: Not on file  . Years of education: Not on file  . Highest education level: Not on file  Social Needs  . Financial resource strain: Not on file  . Food insecurity - worry: Not on file  . Food insecurity - inability: Not on file  . Transportation needs - medical: Not on file  . Transportation needs - non-medical: Not on file  Occupational History  . Not on file  Tobacco Use  . Smoking status: Never Smoker  . Smokeless tobacco: Never Used  Substance and Sexual Activity  . Alcohol use: Yes    Alcohol/week: 0.0 oz    Comment: occasional glass of wine  . Drug use: No  . Sexual activity: Not Currently  Other Topics Concern  . Not on file  Social History Narrative  . Not on file    Interim medical history since last visit reviewed. Allergies and medications reviewed  Review of Systems Per HPI unless specifically indicated above     Objective:    BP  130/72   Pulse 87   Temp 97.8 F (36.6 C) (Oral)   Resp 14   Wt 115 lb 14.4 oz (52.6 kg)   SpO2 98%   BMI 24.22 kg/m   Wt Readings from Last 3 Encounters:  02/21/17 115 lb 14.4 oz (52.6 kg)  11/14/16 117 lb 6.4 oz (53.3 kg)  11/02/16 119 lb 3.2 oz (54.1 kg)    Physical Exam  Constitutional: No distress.  Small-statured, petite woman, no distress  Eyes: EOM are normal. No scleral icterus.  Neck: No JVD present.  Cardiovascular: Normal rate and regular rhythm.  Pulmonary/Chest: Effort normal and breath sounds normal.  Abdominal: She exhibits no distension.  Musculoskeletal: She exhibits no edema.  Neurological: She is alert. She displays no tremor.  Skin: Skin is warm. No pallor.  Psychiatric: She has a normal mood and affect. Her mood appears not anxious. She does not exhibit a depressed mood.  Very pleasant   Results for orders placed or performed in visit on 74/12/87  Basic Metabolic Panel (BMET)  Result Value Ref Range   Sodium 140 135 - 146 mmol/L   Potassium 4.9 3.5 - 5.3 mmol/L   Chloride 104 98 - 110 mmol/L   CO2 28 20 - 31 mmol/L   Glucose, Bld 81 65 - 99 mg/dL   BUN 10 7 - 25 mg/dL   Creat 0.81 0.60 - 0.93 mg/dL   Calcium 9.0 8.6 - 10.4 mg/dL  POCT urinalysis dipstick  Result Value Ref Range   Color, UA light yellow    Clarity, UA clear    Glucose, UA neg    Bilirubin, UA neg    Ketones, UA neg    Spec Grav, UA 1.015 1.010 - 1.025   Blood, UA neg    pH, UA 6.0 5.0 - 8.0   Protein, UA neg    Urobilinogen, UA 0.2 0.2 or 1.0 E.U./dL   Nitrite, UA neg    Leukocytes, UA Negative Negative      Assessment & Plan:   Problem List Items Addressed This Visit      Respiratory   Allergic rhinitis    Back on nasal corticosteroid        Endocrine   IFG (impaired fasting glucose)    Check A1c; strong fam hx of diabetes  Hypothyroidism    Checking TSH today; previous endo note reviewed        Musculoskeletal and Integument   Osteopenia    Fall  risk, calcium intake; reviewed last DEXA report with her; spine normal, hip osteopenic      Ocular rosacea    Under management of eye specialist        Other   Hyperlipidemia    Monitor lipids      Cataract of both eyes    Managed by eye specialist       Other Visit Diagnoses    Needs flu shot    -  Primary   Relevant Orders   Flu vaccine HIGH DOSE PF (Fluzone High dose) (Completed)   Screening for breast cancer       Relevant Orders   MM Digital Screening       Follow up plan: Return in about 4 months (around 06/21/2017) for Medicare Wellness check with Dr. Sanda Klein.  An after-visit summary was printed and given to the patient at [place].  Please see the patient instructions which may contain other information and recommendations beyond what is mentioned above in the assessment and plan.  No orders of the defined types were placed in this encounter.   Orders Placed This Encounter  Procedures  . MM Digital Screening  . Flu vaccine HIGH DOSE PF (Fluzone High dose)

## 2017-02-21 NOTE — Assessment & Plan Note (Signed)
Managed by eye specialist 

## 2017-02-22 LAB — LIPID PANEL
Cholesterol: 217 mg/dL — ABNORMAL HIGH (ref <200)
HDL: 64 mg/dL (ref >50)
LDL Cholesterol (Calc): 131 mg/dL (calc) — ABNORMAL HIGH
Non-HDL Cholesterol (Calc): 153 mg/dL (calc) — ABNORMAL HIGH (ref <130)
Total CHOL/HDL Ratio: 3.4 (calc) (ref <5.0)
Triglycerides: 112 mg/dL (ref <150)

## 2017-02-22 LAB — HEMOGLOBIN A1C
Hgb A1c MFr Bld: 5.4 % of total Hgb (ref <5.7)
Mean Plasma Glucose: 108 (calc)
eAG (mmol/L): 6 (calc)

## 2017-02-22 LAB — TSH: TSH: 0.89 mIU/L (ref 0.40–4.50)

## 2017-04-19 IMAGING — US US SOFT TISSUE HEAD/NECK
1 series · 14 of 25 positions shown · non-contrast
Comparison: 08/01/2012, 04/03/2011

CLINICAL DATA: 69-year-old female with a history of hypothyroidism,
left thyroid nodule. Previous left thyroid nodule biopsy.

EXAM:
THYROID ULTRASOUND
TECHNIQUE: Ultrasound examination of the thyroid gland and adjacent soft
tissues was performed.

[Series 1: us soft tissue head/neck · 0.06mm/px · 14 of 75 slices shown]
[im 1/75]
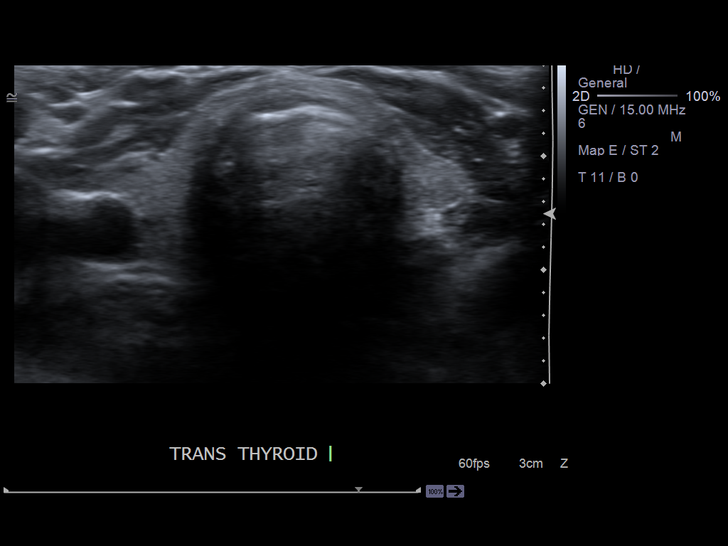
[im 7/75]
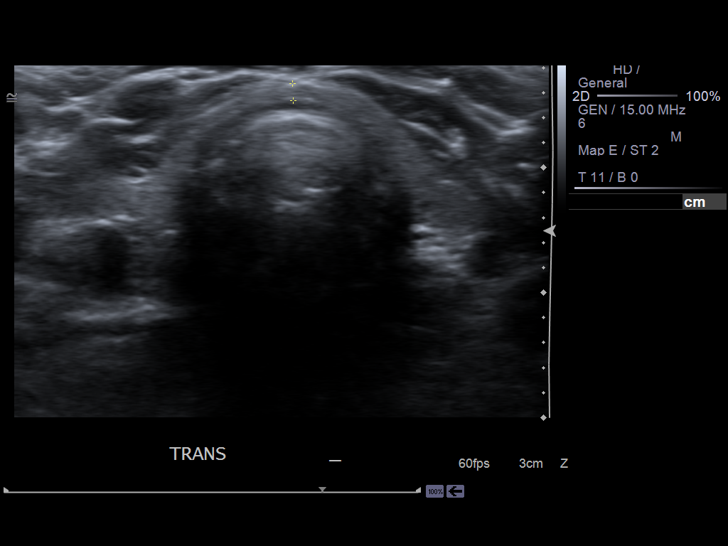
[im 13/75]
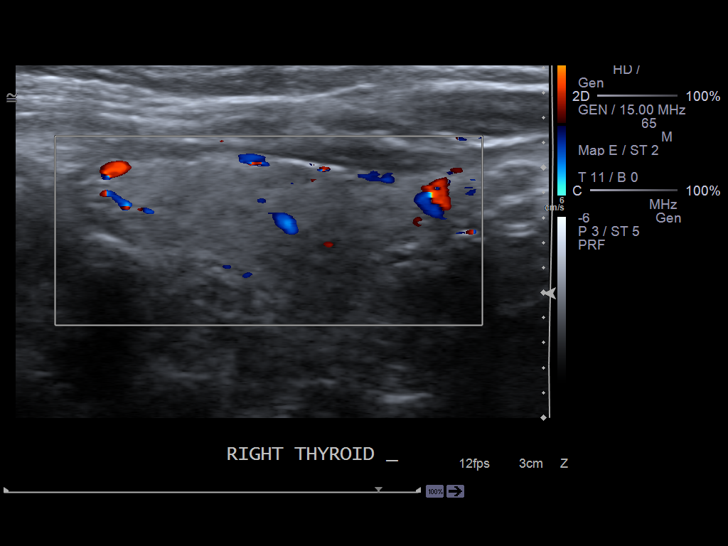
[im 19/75]
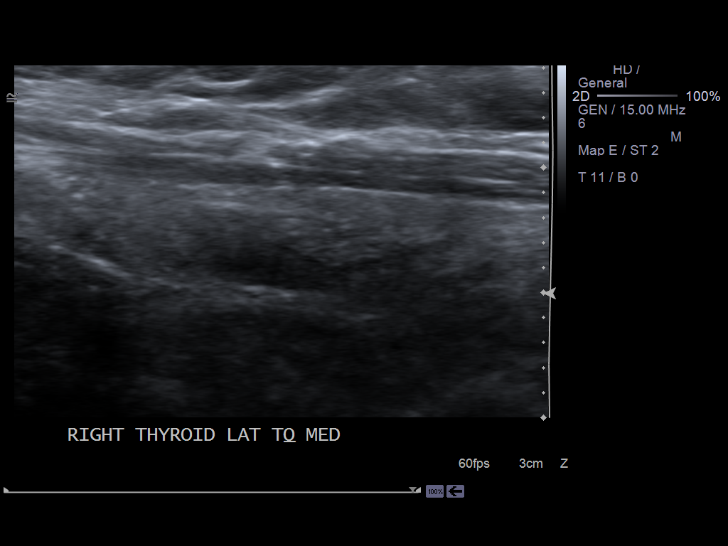
[im 25/75]
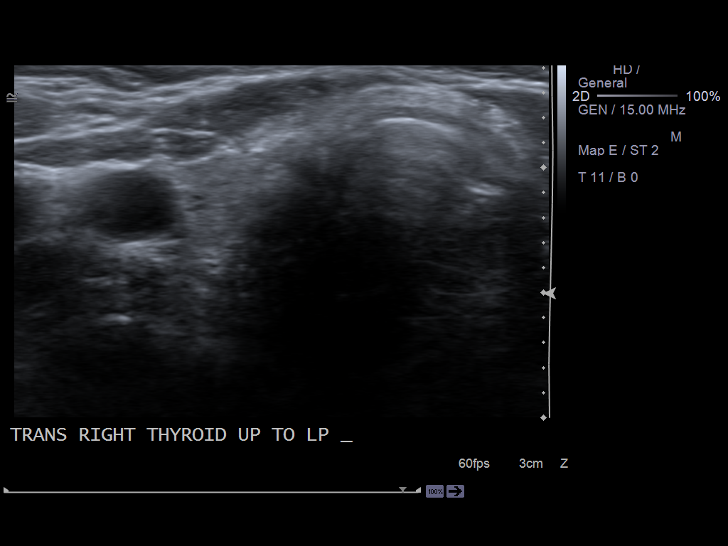
[im 28/75]
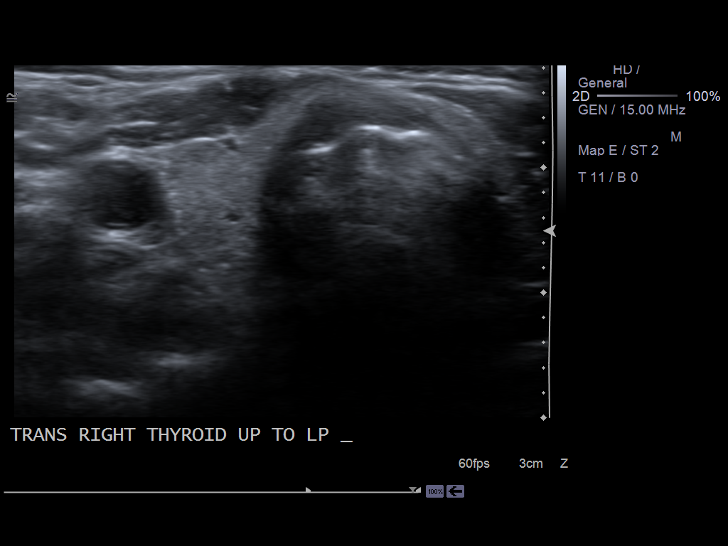
[im 34/75]
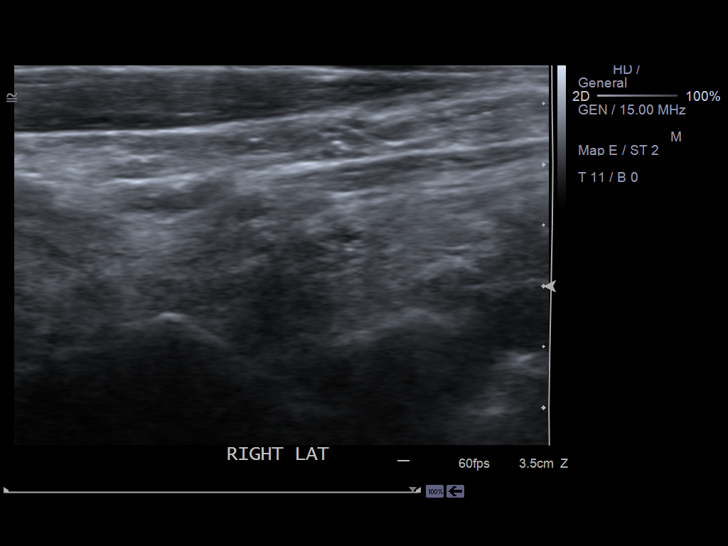
[im 41/75]
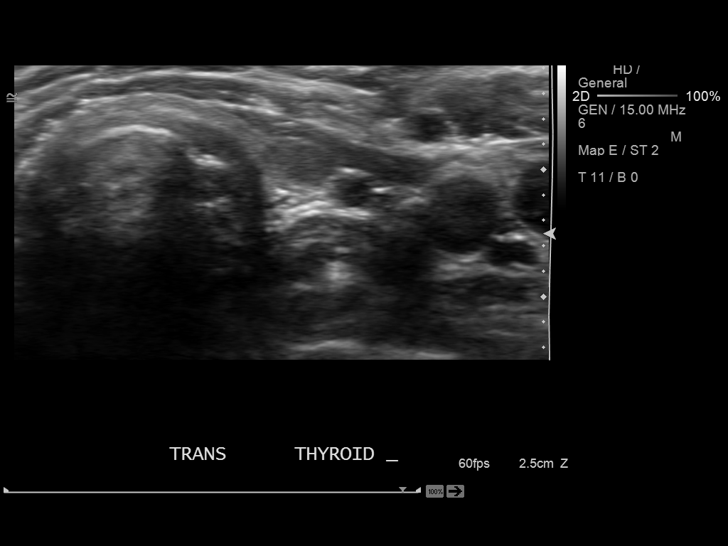
[im 47/75]
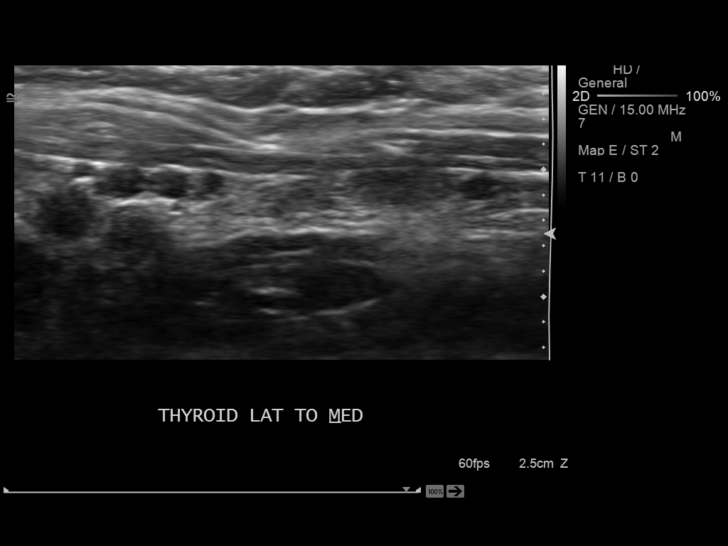
[im 50/75]
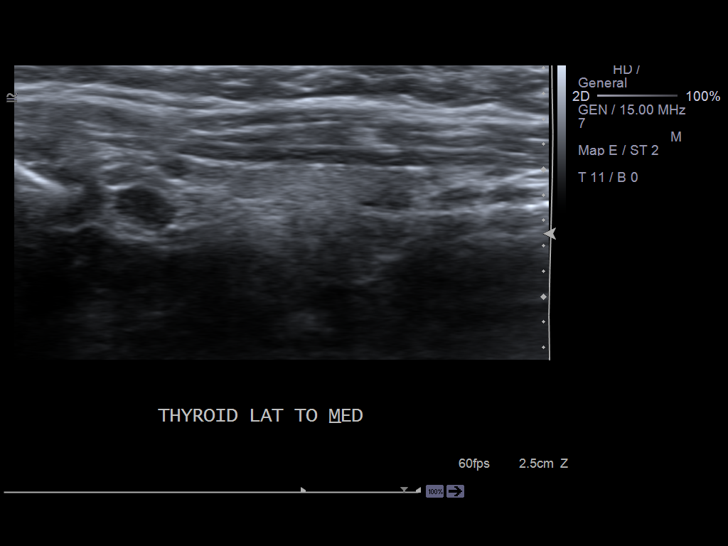
[im 56/75]
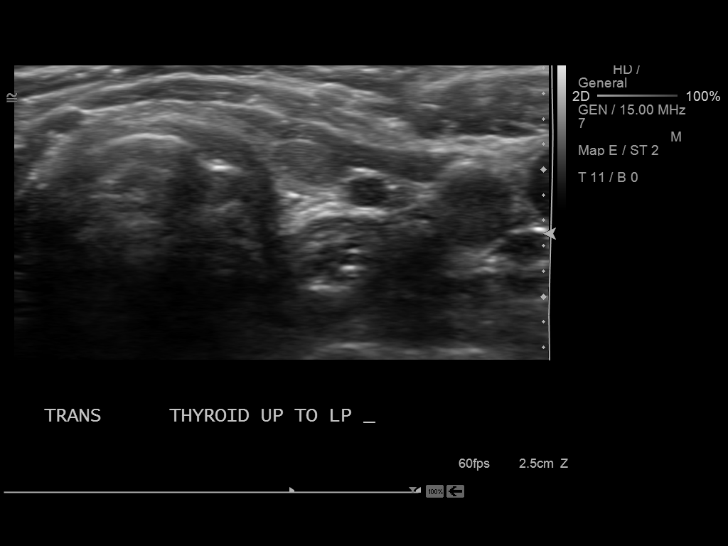
[im 62/75]
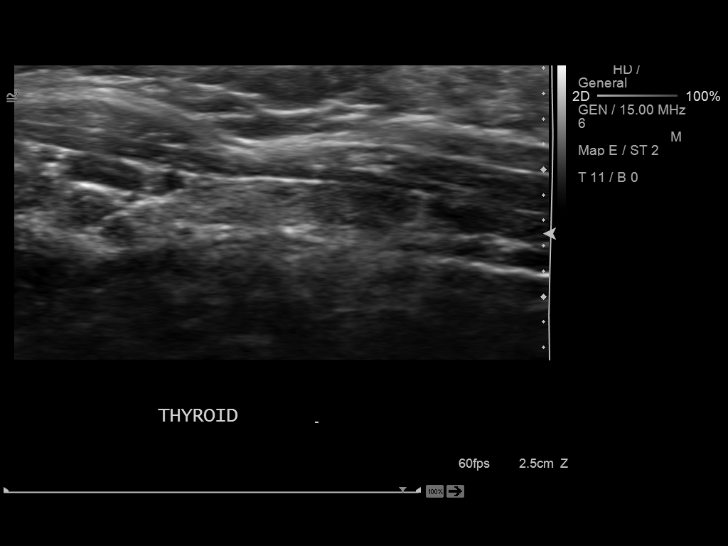
[im 68/75]
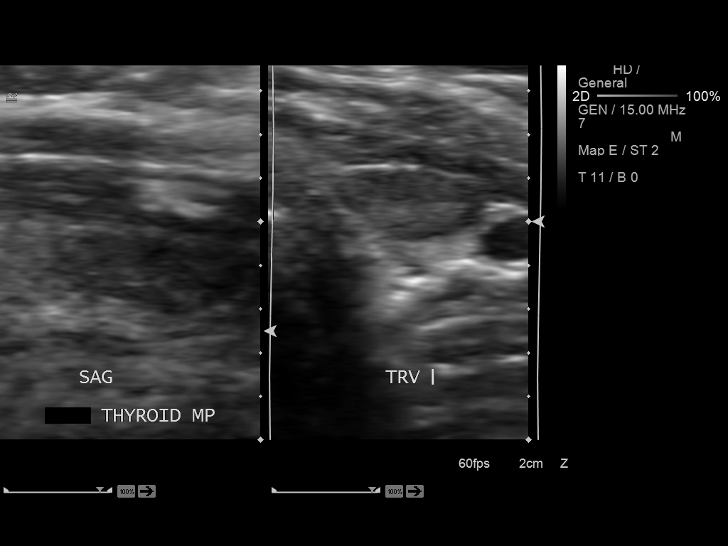
[im 75/75]
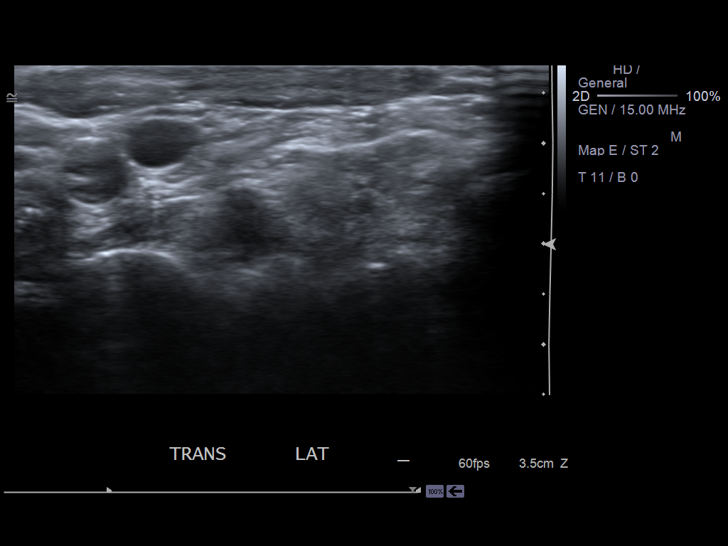

[14 of 25 positions shown; findings below may reference images not displayed]

FINDINGS: Right thyroid lobe

Measurements: 2.9 cm x 1.1 cm x 0.8 cm.  No nodules visualized.

Left thyroid lobe

Measurements: 2.0 cm x 0.5 cm x 0.6 cm. Single left thyroid nodule
identified. Nodule measures less than 1 cm.

Isthmus

Thickness: 2 mm.  No nodules visualized.

Lymphadenopathy

None visualized.
IMPRESSION: Single left-sided nodule. By record, this nodule has been previously
biopsied. Recommend correlation with prior biopsy results.

Findings do not meet current SRU consensus criteria for biopsy.
Follow-up by clinical exam is recommended. If patient has known risk
factors for thyroid carcinoma, consider follow-up ultrasound in 12
months. If patient is clinically hyperthyroid, consider nuclear
medicine thyroid uptake and scan.Reference: Management of Thyroid
Nodules Detected at US: Society of Radiologists in Ultrasound

## 2017-06-02 NOTE — Progress Notes (Signed)
Closing out lab/order note open since:  October 2017

## 2017-06-24 ENCOUNTER — Ambulatory Visit (INDEPENDENT_AMBULATORY_CARE_PROVIDER_SITE_OTHER): Payer: Medicare Other | Admitting: Family Medicine

## 2017-06-24 ENCOUNTER — Encounter: Payer: Self-pay | Admitting: Family Medicine

## 2017-06-24 VITALS — HR 86 | Temp 97.7°F | Resp 14 | Wt 117.1 lb

## 2017-06-24 DIAGNOSIS — N3000 Acute cystitis without hematuria: Secondary | ICD-10-CM

## 2017-06-24 DIAGNOSIS — R109 Unspecified abdominal pain: Secondary | ICD-10-CM | POA: Diagnosis not present

## 2017-06-24 LAB — BASIC METABOLIC PANEL
BUN: 7 mg/dL (ref 7–25)
CO2: 29 mmol/L (ref 20–32)
Calcium: 9 mg/dL (ref 8.6–10.4)
Chloride: 104 mmol/L (ref 98–110)
Creat: 0.83 mg/dL (ref 0.60–0.93)
Glucose, Bld: 84 mg/dL (ref 65–99)
Potassium: 4.1 mmol/L (ref 3.5–5.3)
Sodium: 139 mmol/L (ref 135–146)

## 2017-06-24 LAB — CBC WITH DIFFERENTIAL/PLATELET
Basophils Absolute: 27 cells/uL (ref 0–200)
Basophils Relative: 0.4 %
Eosinophils Absolute: 109 cells/uL (ref 15–500)
Eosinophils Relative: 1.6 %
HCT: 43.4 % (ref 35.0–45.0)
Hemoglobin: 14.6 g/dL (ref 11.7–15.5)
Lymphs Abs: 1428 cells/uL (ref 850–3900)
MCH: 29 pg (ref 27.0–33.0)
MCHC: 33.6 g/dL (ref 32.0–36.0)
MCV: 86.1 fL (ref 80.0–100.0)
MPV: 10.3 fL (ref 7.5–12.5)
Monocytes Relative: 5.1 %
Neutro Abs: 4889 cells/uL (ref 1500–7800)
Neutrophils Relative %: 71.9 %
Platelets: 338 10*3/uL (ref 140–400)
RBC: 5.04 10*6/uL (ref 3.80–5.10)
RDW: 12.8 % (ref 11.0–15.0)
Total Lymphocyte: 21 %
WBC mixed population: 347 cells/uL (ref 200–950)
WBC: 6.8 10*3/uL (ref 3.8–10.8)

## 2017-06-24 LAB — POCT URINALYSIS DIPSTICK
Bilirubin, UA: NEGATIVE
Blood, UA: NEGATIVE
Glucose, UA: NEGATIVE
Ketones, UA: NEGATIVE
Nitrite, UA: NEGATIVE
Odor: NORMAL
Protein, UA: NEGATIVE
Spec Grav, UA: 1.01 (ref 1.010–1.025)
Urobilinogen, UA: 0.2 E.U./dL
pH, UA: 5 (ref 5.0–8.0)

## 2017-06-24 MED ORDER — SULFAMETHOXAZOLE-TRIMETHOPRIM 800-160 MG PO TABS: 1.0000 | ORAL_TABLET | Freq: Two times a day (BID) | ORAL | 0 refills | Status: DC

## 2017-06-24 NOTE — Patient Instructions (Signed)
Start the antibiotics Please do eat yogurt or kimchi or take a probiotic daily for the next month We want to replace the healthy germs in the gut If you notice foul, watery diarrhea in the next two months, schedule an appointment RIGHT AWAY or go to an urgent care or the emergency room if a holiday or over a weekend Let us know if your symptoms worsen If you have not heard anything from my staff in a week about any orders/referrals/studies from today, please contact us here to follow-up [phone]

## 2017-06-24 NOTE — Progress Notes (Signed)
Pulse 86   Temp 97.7 F (36.5 C) (Oral)   Resp 14   Wt 117 lb 1.6 oz (53.1 kg)   SpO2 97%   BMI 24.47 kg/m    Subjective:    Patient ID: Angela Kelly, female    DOB: 12/09/1945, 72 y.o.   MRN: [id number]  HPI: Angela Kelly is a 72 y.o. female  Chief Complaint  Patient presents with  . Flank Pain    /kidney bilateral onset 1 week, pt states kidneys feel swollen, denies any problems with urination    HPI Patient is here for an acute illness She says that her kidneys feel swollen Bilateral kidney pain for one week No issues with urination; no hx of kidney stones; no blood in the urine She points over the flanks She has been on doxycycline for a long time; stopped taking it for a while Started drinking coffee again and thinks the combination may have done this She has had this before; back then, stopped the coffee and tried cranberry juice and got over it Took a dose of doxy yesterday Gave up the coffee, started back on cranberry Having urinary frequency; no hesitancy Urine went completely clear when it was the worst, none of the toxins were coming out of her body No fevers No long plane flights, no long car rides itching all over, dry skin Sister has kidney problems; sister has diabetes and one kidney doesn't function at all A1c 5.7 in April 2017; normal since then     Depression screen [facility] 2/9 06/24/2017 02/21/2017 11/02/2016 09/19/2016 08/20/2016  Decreased Interest 0 0 0 0 0  Down, Depressed, Hopeless 0 0 0 0 0  PHQ - 2 Score 0 0 0 0 0    Relevant past medical, surgical, family and social history reviewed Past Medical History:  Diagnosis Date  . Allergic rhinitis   . Allergy   . Arthritis   . Bilateral ovarian cysts    laterality not known  . Bunion   . Cataract of both eyes   . Deviated septum   . Elevated BP   . Fibrocystic breast   . GERD (gastroesophageal reflux disease)   . Hammer toe   . History of closed fracture of nasal bones   . History of  pneumonia    in her 44's  . Hyperlipidemia   . IFG (impaired fasting glucose)   . Ocular rosacea    see's [facility]  . OSA (obstructive sleep apnea)   . Osteoarthritis cervical spine   . Osteoarthritis of both hands   . Osteoporosis   . Rosacea    telengiectatic  . Shingles    left eye  . Thyroid disease   . Thyroid nodule    left, sees Endocrinology  . Vitamin D deficiency disease    Past Surgical History:  Procedure Laterality Date  . BACK SURGERY     lumbar laminectomy  . BIOPSY THYROID    . BREAST CYST ASPIRATION Left   . BREAST SURGERY    . BUNIONECTOMY Right    Family History  Problem Relation Age of Onset  . Asthma Mother   . Hypertension Mother   . Diabetes Mother   . Cancer Mother        Leukemia  . Peripheral vascular disease Mother   . Osteoporosis Mother   . Glaucoma Mother   . Heart disease Father   . Early death Father   . Heart attack Father   .  Hypertension Sister   . Diabetes Sister   . Glaucoma Sister   . Diabetes Maternal Grandmother   . Colon cancer Maternal Uncle   . Lung cancer Paternal Uncle    Social History   Tobacco Use  . Smoking status: Never Smoker  . Smokeless tobacco: Never Used  Substance Use Topics  . Alcohol use: Yes    Alcohol/week: 0.0 oz    Comment: occasional glass of wine  . Drug use: No    Interim medical history since last visit reviewed. Allergies and medications reviewed  Review of Systems Per HPI unless specifically indicated above     Objective:    Pulse 86   Temp 97.7 F (36.5 C) (Oral)   Resp 14   Wt 117 lb 1.6 oz (53.1 kg)   SpO2 97%   BMI 24.47 kg/m   Wt Readings from Last 3 Encounters:  06/24/17 117 lb 1.6 oz (53.1 kg)  02/21/17 115 lb 14.4 oz (52.6 kg)  11/14/16 117 lb 6.4 oz (53.3 kg)    Physical Exam  Constitutional: She appears well-developed and well-nourished. No distress.  HENT:  Mouth/Throat: Mucous membranes are normal.  Eyes: EOM are normal. No scleral icterus.    Cardiovascular: Normal rate and regular rhythm.  Pulmonary/Chest: Effort normal and breath sounds normal.  Abdominal: Soft. Bowel sounds are normal. She exhibits no distension. There is no tenderness. There is no rigidity, no guarding and no CVA tenderness.  Musculoskeletal: She exhibits no edema (no pitting edema).  Neurological: She is alert.  Skin: No pallor.  Psychiatric: She has a normal mood and affect. Her behavior is normal.    Results for orders placed or performed in visit on 06/24/17  POCT urinalysis dipstick  Result Value Ref Range   Color, UA light yellow    Clarity, UA clera    Glucose, UA neg    Bilirubin, UA neg    Ketones, UA neg    Spec Grav, UA 1.010 1.010 - 1.025   Blood, UA neg    pH, UA 5.0 5.0 - 8.0   Protein, UA neg    Urobilinogen, UA 0.2 0.2 or 1.0 E.U./dL   Nitrite, UA neg    Leukocytes, UA Large (3+) (A) Negative   Appearance clera    Odor normal       Assessment & Plan:   Problem List Items Addressed This Visit    None    Visit Diagnoses    Flank pain    -  Primary   urine shows 3+ LE; start antibitoics (just TMP/SMX, stop doxy); urine culture pending   Relevant Orders   POCT urinalysis dipstick (Completed)   Urine Culture   Basic Metabolic Panel (BMET)   CBC with Differential/Platelet   Acute cystitis without hematuria       start new antibiotic; STOP the doxy while on this; patient agrees; hydration; call or seek care if getting worse       Follow up plan: No Follow-up on file.  An after-visit summary was printed and given to the patient at [place].  Please see the patient instructions which may contain other information and recommendations beyond what is mentioned above in the assessment and plan.  Meds ordered this encounter  Medications  . sulfamethoxazole-trimethoprim (BACTRIM DS) 800-160 MG tablet    Sig: Take 1 tablet by mouth 2 (two) times daily. No salt substitutes, no black licorice, no black jelly    Dispense:  14  tablet    Refill:  0    Orders Placed This Encounter  Procedures  . Urine Culture  . Basic Metabolic Panel (BMET)  . CBC with Differential/Platelet  . POCT urinalysis dipstick

## 2017-06-25 LAB — URINE CULTURE
MICRO NUMBER:: 90308068
Result:: NO GROWTH
SPECIMEN QUALITY:: ADEQUATE

## 2017-06-26 ENCOUNTER — Ambulatory Visit
Admission: RE | Admit: 2017-06-26 | Discharge: 2017-06-26 | Disposition: A | Discharge status: Home / Self Care | Payer: Medicare Other | Source: Ambulatory Visit | Attending: Family Medicine | Admitting: Family Medicine

## 2017-06-26 ENCOUNTER — Ambulatory Visit: Payer: Self-pay | Admitting: *Deleted

## 2017-06-26 DIAGNOSIS — Z1231 Encounter for screening mammogram for malignant neoplasm of breast: Secondary | ICD-10-CM | POA: Diagnosis not present

## 2017-06-26 DIAGNOSIS — Z1239 Encounter for other screening for malignant neoplasm of breast: Secondary | ICD-10-CM

## 2017-06-26 NOTE — Telephone Encounter (Signed)
Pt reports abdominal pain, upper quad, under rib area. States constant 7/10, onset this am, has not subsided. Was seen Monday by Dr. Sanda Klein, UTI, on ATBs. States flank pain and "edema" have decreased greatly however right sided pain presented this AM. Denies any  N/V/D, no fever, no epigastric burning, no CP. Reports 1 episode of "pinkish" bleeding with urination this am, no further episodes of hematuria.  Directed to ED which she declines presently. Appt made for tomorrow with E. Uvaldo Rising. Instructed to go to ED if symptoms worsen; increased pain, fever,vomiting, further hematuria, or if pain radiates. Instructed pt to stay hydrated. She reports she has been taking the antibiotic Rx yesterday. States she will go to ED if needed.  Reason for Disposition . [1] MILD-MODERATE pain AND [2] constant AND [3] present > 2 hours  Answer Assessment - Initial Assessment Questions 1. LOCATION: "Where does it hurt?"      Right sided abdomen ,upper quad, under rib area 2. RADIATION: "Does the pain shoot anywhere else?" (e.g., chest, back)     No 3. ONSET: "When did the pain begin?" (e.g., minutes, hours or days ago)      This am 4. SUDDEN: "Gradual or sudden onset?"     Gradual 5. PATTERN "Does the pain come and go, or is it constant?"    - If constant: "Is it getting better, staying the same, or worsening?"      (Note: Constant means the pain never goes away completely; most serious pain is constant and it progresses)     - If intermittent: "How long does it last?" "Do you have pain now?"     (Note: Intermittent means the pain goes away completely between bouts)     Constant 7/10 6. SEVERITY: "How bad is the pain?"  (e.g., Scale 1-10; mild, moderate, or severe)   - MILD (1-3): doesn't interfere with normal activities, abdomen soft and not tender to touch    - MODERATE (4-7): interferes with normal activities or awakens from sleep, tender to touch    - SEVERE (8-10): excruciating pain, doubled over, unable to do  any normal activities      7/10 7. RECURRENT SYMPTOM: "Have you ever had this type of abdominal pain before?" If so, ask: "When was the last time?" and "What happened that time?"      Had flank pain, UTI; seen by PCP monday 8. CAUSE: "What do you think is causing the abdominal pain?"    " May be the kidney issue 'Im having, but this is a different type of pain." 9. RELIEVING/AGGRAVATING FACTORS: "What makes it better or worse?" (e.g., movement, antacids, bowel movement)     Nothing 10. OTHER SYMPTOMS: "Has there been any vomiting, diarrhea, constipation, or urine problems?"       1 episode of blood in urine yesterday "pinkish." No N/V/D  Protocols used: ABDOMINAL PAIN - [facility]

## 2017-06-27 ENCOUNTER — Ambulatory Visit: Payer: Self-pay | Admitting: Family Medicine

## 2017-06-27 NOTE — Telephone Encounter (Signed)
For informational purposes only.

## 2017-07-01 ENCOUNTER — Ambulatory Visit: Payer: Self-pay

## 2017-07-01 NOTE — Telephone Encounter (Signed)
Pt. Reports she has finished her antibiotic and still has flank pain and swelling. Denies fever or urinary symptoms. Appointment for tomorrow.  Reason for Disposition . MODERATE pain (e.g., interferes with normal activities or awakens from sleep)  Answer Assessment - Initial Assessment Questions 1. LOCATION: "Where does it hurt?" (e.g., left, right)     Both sides - right is worse 2. ONSET: "When did the pain start?"     Started last week 3. SEVERITY: "How bad is the pain?" (e.g., Scale 1-10; mild, moderate, or severe)   - MILD (1-3): doesn't interfere with normal activities    - MODERATE (4-7): interferes with normal activities or awakens from sleep    - SEVERE (8-10): excruciating pain and patient unable to do normal activities (stays in bed)       4-7 4. PATTERN: "Does the pain come and go, or is it constant?"      Comes and goes 5. CAUSE: "What do you think is causing the pain?"     Kidney  6. OTHER SYMPTOMS:  "Do you have any other symptoms?" (e.g., fever, abdominal pain, vomiting, leg weakness, burning with urination, blood in urine)     Swelling 7. PREGNANCY:  "Is there any chance you are pregnant?" "When was your last menstrual period?"     No  Protocols used: FLANK PAIN-A-AH

## 2017-07-02 ENCOUNTER — Ambulatory Visit
Admission: RE | Admit: 2017-07-02 | Discharge: 2017-07-02 | Disposition: A | Discharge status: Home / Self Care | Payer: Medicare Other | Source: Ambulatory Visit | Attending: Family Medicine | Admitting: Family Medicine

## 2017-07-02 ENCOUNTER — Encounter: Payer: Self-pay | Admitting: Family Medicine

## 2017-07-02 ENCOUNTER — Ambulatory Visit (INDEPENDENT_AMBULATORY_CARE_PROVIDER_SITE_OTHER): Payer: Medicare Other | Admitting: Family Medicine

## 2017-07-02 VITALS — BP 118/72 | HR 92 | Temp 97.7°F | Resp 18 | Ht <= 58 in | Wt 114.6 lb

## 2017-07-02 DIAGNOSIS — R109 Unspecified abdominal pain: Secondary | ICD-10-CM | POA: Diagnosis not present

## 2017-07-02 DIAGNOSIS — R310 Gross hematuria: Secondary | ICD-10-CM | POA: Diagnosis not present

## 2017-07-02 DIAGNOSIS — N390 Urinary tract infection, site not specified: Secondary | ICD-10-CM | POA: Insufficient documentation

## 2017-07-02 DIAGNOSIS — R319 Hematuria, unspecified: Secondary | ICD-10-CM | POA: Insufficient documentation

## 2017-07-02 DIAGNOSIS — R8281 Pyuria: Secondary | ICD-10-CM

## 2017-07-02 LAB — POCT URINALYSIS DIPSTICK
Bilirubin, UA: NEGATIVE
Glucose, UA: NEGATIVE
Ketones, UA: NEGATIVE
Nitrite, UA: POSITIVE
Protein, UA: NEGATIVE
Spec Grav, UA: 1.015 (ref 1.010–1.025)
Urobilinogen, UA: NEGATIVE E.U./dL — AB
pH, UA: 7 (ref 5.0–8.0)

## 2017-07-02 NOTE — Progress Notes (Signed)
Name: Angela Kelly   MRN: [id number]    DOB: [birth year]   Date:07/02/2017       Progress Note  Subjective  Chief Complaint  Chief Complaint  Patient presents with  . Flank Pain    follow up  . Medication Problem    HPI  Pt presents for follow up on flank pain and acute cystitis - was seen on 06/24/2017 and was given 7 days of Bactrim DS to take - completed this 2 days ago.  She has been taking doxycycline for over a year due to ocular rosacea, she was told to pause the doxycyline while taking Bactrim - wants to know if it is safe to restart now. - Current Symptoms include ongoing but significantly improving flank pain.  - Denies fevers/chills, NVD, abdominal pain, dysuria, or hematuria. - No history of kidney stones or disease; she does note her sister has kidney disease of some kind.  Patient Active Problem List   Diagnosis Date Noted  . Need for hepatitis C screening test 11/07/2015  . Preventative health care 11/07/2015  . Nausea 10/27/2015  . Medication monitoring encounter 08/05/2015  . Abnormal mammogram of left breast 01/18/2015  . OSA (obstructive sleep apnea)   . Hyperlipidemia   . Thyroid disease   . Cataract of both eyes   . Ocular rosacea   . Bunion   . Hammer toe   . Allergic rhinitis   . Hypothyroidism 08/03/2014  . Left thyroid nodule 08/03/2014  . IFG (impaired fasting glucose) 08/03/2014  . Osteopenia 08/03/2014    Social History   Tobacco Use  . Smoking status: Never Smoker  . Smokeless tobacco: Never Used  Substance Use Topics  . Alcohol use: Yes    Alcohol/week: 0.0 oz    Comment: occasional glass of wine     Current Outpatient Medications:  .  aspirin EC 81 MG tablet, Take 1 tablet (81 mg total) by mouth daily., Disp: 30 tablet, Rfl: 11 .  B Complex-C (SUPER B COMPLEX PO), Take 1 capsule by mouth daily. Reported on 10/27/2015, Disp: , Rfl:  .  beta carotene w/minerals (OCUVITE) tablet, Take 1 tablet by mouth daily. Reported on 10/27/2015,  Disp: , Rfl:  .  calcium carbonate (OS-CAL) 600 MG TABS tablet, Take 600 mg by mouth 2 (two) times daily with a meal. Reported on 10/27/2015, Disp: , Rfl:  .  cholecalciferol (VITAMIN D) 1000 UNITS tablet, Take 1,000 Units by mouth daily. Reported on 10/27/2015, Disp: , Rfl:  .  ciclesonide (OMNARIS) 50 MCG/ACT nasal spray, Place 2 sprays into both nostrils daily. Reported on 10/27/2015, Disp: , Rfl:  .  Cranberry 1000 MG CAPS, Take by mouth., Disp: , Rfl:  .  diphenhydrAMINE (BENADRYL) 25 mg capsule, Take 25 mg by mouth every 4 (four) hours as needed. Reported on 10/27/2015, Disp: , Rfl:  .  levothyroxine (SYNTHROID, LEVOTHROID) 75 MCG tablet, Take 1 tablet (75 mcg total) by mouth daily., Disp: 90 tablet, Rfl: 3 .  Lysine 1000 MG TABS, Take 1 tablet by mouth as needed. Reported on 10/27/2015, Disp: , Rfl:  .  mometasone (NASONEX) 50 MCG/ACT nasal spray, INSTILL 2 SPRAYS INTO EACH NOSTRIL prn, Disp: , Rfl: 3 .  Multiple Vitamin (MULTIVITAMIN) capsule, Take 1 capsule by mouth daily. Reported on 10/27/2015, Disp: , Rfl:  .  Omega-3 Fatty Acids (FISH OIL PO), Take 2 capsules by mouth daily. Reported on 10/27/2015, Disp: , Rfl:  .  PAZEO 0.7 % SOLN, Place 1 drop  into both eyes daily. Reported on 10/27/2015, Disp: , Rfl: 5 .  Probiotic Product (PROBIOTIC DAILY PO), Take 1 capsule by mouth as needed. Reported on 10/27/2015, Disp: , Rfl:  .  vitamin B-12 (CYANOCOBALAMIN) 100 MCG tablet, Take 100 mcg by mouth daily. Reported on 10/27/2015, Disp: , Rfl:  .  vitamin C (ASCORBIC ACID) 500 MG tablet, Take 500 mg by mouth daily. Reported on 10/27/2015, Disp: , Rfl:  .  vitamin E 400 UNIT capsule, Take 400 Units by mouth daily. Reported on 10/27/2015, Disp: , Rfl:  .  doxycycline (VIBRAMYCIN) 50 MG capsule, Take 50 mg by mouth daily. , Disp: , Rfl: 3 .  sulfamethoxazole-trimethoprim (BACTRIM DS) 800-160 MG tablet, Take 1 tablet by mouth 2 (two) times daily. No salt substitutes, no black licorice, no black jelly (Patient not  taking: Reported on 07/02/2017), Disp: 14 tablet, Rfl: 0  Allergies  Allergen Reactions  . Codeine Other (See Comments)    Ears ringing  . Lovastatin Rash  . Nickel Rash  . Tape Rash    ROS  Constitutional: Negative for fever or weight change.  Respiratory: Negative for cough and shortness of breath.   Cardiovascular: Negative for chest pain or palpitations.  Gastrointestinal: Negative for abdominal pain, no bowel changes.  See HPI  Musculoskeletal: Negative for gait problem or joint swelling.  Skin: Negative for rash.  Neurological: Negative for dizziness or headache.  No other specific complaints in a complete review of systems (except as listed in HPI above).  Objective  Vitals:   07/02/17 1047  BP: 118/72  Pulse: 92  Resp: 18  Temp: 97.7 F (36.5 C)  TempSrc: Oral  SpO2: 97%  Weight: 114 lb 9.6 oz (52 kg)  Height: 4\' 10"  (1.473 m)   Body mass index is 23.95 kg/m.  Nursing Note and Vital Signs reviewed.  Physical Exam  Constitutional: Patient appears well-developed and well-nourished.  No distress.  HEENT: head atraumatic, normocephalic Cardiovascular: Normal rate, regular rhythm, S1/S2 present.  No murmur or rub heard. No BLE edema. Pulmonary/Chest: Effort normal and breath sounds clear. No respiratory distress or retractions. Abdominal: Soft and non-tender, bowel sounds present x4 quadrants.  No CVA Tenderness, though she does endorse ongoing flank pain worse on the LEFT Psychiatric: Patient has a normal mood and affect. behavior is normal. Judgment and thought content normal.  Results for orders placed or performed in visit on 07/02/17 (from the past 72 hour(s))  POCT urinalysis dipstick     Status: Abnormal   Collection Time: 07/02/17 11:04 AM  Result Value Ref Range   Color, UA yellow    Clarity, UA clear    Glucose, UA negative    Bilirubin, UA negative    Ketones, UA negative    Spec Grav, UA 1.015 1.010 - 1.025   Blood, UA trace    pH, UA 7.0 5.0  - 8.0   Protein, UA negative    Urobilinogen, UA negative (A) 0.2 or 1.0 E.U./dL   Nitrite, UA positive    Leukocytes, UA Trace (A) Negative   Appearance clear    Odor none     Assessment & Plan  1. Flank pain - POCT urinalysis dipstick - Urine Culture - Urinalysis, microscopic only - CT RENAL STONE STUDY; Future  2. Hematuria, unspecified type - POCT urinalysis dipstick - Urinalysis, microscopic only - CT RENAL STONE STUDY; Future  3. Pyuria - Urine Culture - CT RENAL STONE STUDY; Future  -Red flags and when to present for  emergency care or RTC including fever >101.79F, chest pain, shortness of breath, new/worsening/un-resolving symptoms, severe abdominal/flank/back pain, frank hematuria, reviewed with patient at time of visit. Follow up and care instructions discussed and provided in AVS.

## 2017-07-03 LAB — URINE CULTURE
MICRO NUMBER:: 90344847
SPECIMEN QUALITY:: ADEQUATE

## 2017-07-03 LAB — URINALYSIS, MICROSCOPIC ONLY
Bacteria, UA: NONE SEEN /HPF
Hyaline Cast: NONE SEEN /LPF
RBC / HPF: NONE SEEN /HPF (ref 0–2)
Squamous Epithelial / LPF: NONE SEEN /HPF (ref <=5)
WBC, UA: NONE SEEN /HPF (ref 0–5)

## 2017-07-23 ENCOUNTER — Ambulatory Visit (INDEPENDENT_AMBULATORY_CARE_PROVIDER_SITE_OTHER): Payer: Medicare Other

## 2017-07-23 VITALS — BP 112/68 | HR 76 | Temp 98.0°F | Resp 12 | Ht <= 58 in | Wt 116.9 lb

## 2017-07-23 DIAGNOSIS — Z1211 Encounter for screening for malignant neoplasm of colon: Secondary | ICD-10-CM

## 2017-07-23 DIAGNOSIS — Z0000 Encounter for general adult medical examination without abnormal findings: Secondary | ICD-10-CM | POA: Diagnosis not present

## 2017-07-23 NOTE — Patient Instructions (Signed)
[name] , Thank you for taking time to come for your Medicare Wellness Visit. I appreciate your ongoing commitment to your health goals. Please review the following plan we discussed and let me know if I can assist you in the future.   Screening recommendations/referrals: Colorectal Screening: You will receive a call from our office regarding your appointment. Mammogram: Completed 06/26/17. Repeat every year. Bone Density: Completed 01/30/17. Osteoporotic screenings no longer required. Lung Cancer Screening: You do not qualify for this screening Hepatitis C Screening: Completed 11/07/15  Vision and Dental Exams: Recommended annual ophthalmology exams for early detection of glaucoma and other disorders of the eye Recommended annual dental exams for proper oral hygiene  Vaccinations: Influenza vaccine: Up to date Pneumococcal vaccine: Completed series Tdap vaccine: Up to date Shingles vaccine: Up to date. Please call your insurance company to determine your out of pocket expense for the Shingrix vaccine. You may also receive this vaccine at your local pharmacy or Health Dept.  Advanced directives: Please bring a copy of your POA (Power of Attorney) and/or Living Will to your next appointment.  Conditions/risks identified: Recommend to drink at least 6-8 8oz glasses of water per day.  Next appointment: Please schedule your Annual Wellness Visit with your Nurse Health Advisor in one year.  Preventive Care 74 Years and Older, Female Preventive care refers to lifestyle choices and visits with your health care provider that can promote health and wellness. What does preventive care include?  A yearly physical exam. This is also called an annual well check.  Dental exams once or twice a year.  Routine eye exams. Ask your health care provider how often you should have your eyes checked.  Personal lifestyle choices, including:  Daily care of your teeth and gums.  Regular physical  activity.  Eating a healthy diet.  Avoiding tobacco and drug use.  Limiting alcohol use.  Practicing safe sex.  Taking low-dose aspirin every day.  Taking vitamin and mineral supplements as recommended by your health care provider. What happens during an annual well check? The services and screenings done by your health care provider during your annual well check will depend on your age, overall health, lifestyle risk factors, and family history of disease. Counseling  Your health care provider may ask you questions about your:  Alcohol use.  Tobacco use.  Drug use.  Emotional well-being.  Home and relationship well-being.  Sexual activity.  Eating habits.  History of falls.  Memory and ability to understand (cognition).  Work and work Statistician.  Reproductive health. Screening  You may have the following tests or measurements:  Height, weight, and BMI.  Blood pressure.  Lipid and cholesterol levels. These may be checked every 5 years, or more frequently if you are over 74 years old.  Skin check.  Lung cancer screening. You may have this screening every year starting at age 74 if you have a 30-pack-year history of smoking and currently smoke or have quit within the past 15 years.  Fecal occult blood test (FOBT) of the stool. You may have this test every year starting at age 74.  Flexible sigmoidoscopy or colonoscopy. You may have a sigmoidoscopy every 5 years or a colonoscopy every 10 years starting at age 74.  Hepatitis C blood test.  Hepatitis B blood test.  Sexually transmitted disease (STD) testing.  Diabetes screening. This is done by checking your blood sugar (glucose) after you have not eaten for a while (fasting). You may have this done every  1-3 years.  Bone density scan. This is done to screen for osteoporosis. You may have this done starting at age 74.  Mammogram. This may be done every 1-2 years. Talk to your health care provider about  how often you should have regular mammograms. Talk with your health care provider about your test results, treatment options, and if necessary, the need for more tests. Vaccines  Your health care provider may recommend certain vaccines, such as:  Influenza vaccine. This is recommended every year.  Tetanus, diphtheria, and acellular pertussis (Tdap, Td) vaccine. You may need a Td booster every 10 years.  Zoster vaccine. You may need this after age 74.  Pneumococcal 13-valent conjugate (PCV13) vaccine. One dose is recommended after age 74.  Pneumococcal polysaccharide (PPSV23) vaccine. One dose is recommended after age 74. Talk to your health care provider about which screenings and vaccines you need and how often you need them. This information is not intended to replace advice given to you by your health care provider. Make sure you discuss any questions you have with your health care provider. Document Released: 04/29/2015 Document Revised: 12/21/2015 Document Reviewed: 02/01/2015 Elsevier Interactive Patient Education  2017 [place] Prevention in the Home Falls can cause injuries. They can happen to people of all ages. There are many things you can do to make your home safe and to help prevent falls. What can I do on the outside of my home?  Regularly fix the edges of walkways and driveways and fix any cracks.  Remove anything that might make you trip as you walk through a door, such as a raised step or threshold.  Trim any bushes or trees on the path to your home.  Use bright outdoor lighting.  Clear any walking paths of anything that might make someone trip, such as rocks or tools.  Regularly check to see if handrails are loose or broken. Make sure that both sides of any steps have handrails.  Any raised decks and porches should have guardrails on the edges.  Have any leaves, snow, or ice cleared regularly.  Use sand or salt on walking paths during  winter.  Clean up any spills in your garage right away. This includes oil or grease spills. What can I do in the bathroom?  Use night lights.  Install grab bars by the toilet and in the tub and shower. Do not use towel bars as grab bars.  Use non-skid mats or decals in the tub or shower.  If you need to sit down in the shower, use a plastic, non-slip stool.  Keep the floor dry. Clean up any water that spills on the floor as soon as it happens.  Remove soap buildup in the tub or shower regularly.  Attach bath mats securely with double-sided non-slip rug tape.  Do not have throw rugs and other things on the floor that can make you trip. What can I do in the bedroom?  Use night lights.  Make sure that you have a light by your bed that is easy to reach.  Do not use any sheets or blankets that are too big for your bed. They should not hang down onto the floor.  Have a firm chair that has side arms. You can use this for support while you get dressed.  Do not have throw rugs and other things on the floor that can make you trip. What can I do in the kitchen?  Clean up any spills right away.  Avoid walking on wet floors.  Keep items that you use a lot in easy-to-reach places.  If you need to reach something above you, use a strong step stool that has a grab bar.  Keep electrical cords out of the way.  Do not use floor polish or wax that makes floors slippery. If you must use wax, use non-skid floor wax.  Do not have throw rugs and other things on the floor that can make you trip. What can I do with my stairs?  Do not leave any items on the stairs.  Make sure that there are handrails on both sides of the stairs and use them. Fix handrails that are broken or loose. Make sure that handrails are as long as the stairways.  Check any carpeting to make sure that it is firmly attached to the stairs. Fix any carpet that is loose or worn.  Avoid having throw rugs at the top or  bottom of the stairs. If you do have throw rugs, attach them to the floor with carpet tape.  Make sure that you have a light switch at the top of the stairs and the bottom of the stairs. If you do not have them, ask someone to add them for you. What else can I do to help prevent falls?  Wear shoes that:  Do not have high heels.  Have rubber bottoms.  Are comfortable and fit you well.  Are closed at the toe. Do not wear sandals.  If you use a stepladder:  Make sure that it is fully opened. Do not climb a closed stepladder.  Make sure that both sides of the stepladder are locked into place.  Ask someone to hold it for you, if possible.  Clearly mark and make sure that you can see:  Any grab bars or handrails.  First and last steps.  Where the edge of each step is.  Use tools that help you move around (mobility aids) if they are needed. These include:  Canes.  Walkers.  Scooters.  Crutches.  Turn on the lights when you go into a dark area. Replace any light bulbs as soon as they burn out.  Set up your furniture so you have a clear path. Avoid moving your furniture around.  If any of your floors are uneven, fix them.  If there are any pets around you, be aware of where they are.  Review your medicines with your doctor. Some medicines can make you feel dizzy. This can increase your chance of falling. Ask your doctor what other things that you can do to help prevent falls. This information is not intended to replace advice given to you by your health care provider. Make sure you discuss any questions you have with your health care provider. Document Released: 01/27/2009 Document Revised: 09/08/2015 Document Reviewed: 05/07/2014 Elsevier Interactive Patient Education  2017 [employer].

## 2017-07-23 NOTE — Progress Notes (Signed)
Subjective:   Angela Kelly is a 72 y.o. female who presents for Medicare Annual (Subsequent) preventive examination.  Review of Systems:  N/A Cardiac Risk Factors include: advanced age (>100men, >41 women);dyslipidemia;sedentary lifestyle     Objective:     Vitals: BP 112/68 (BP Location: Left Arm, Patient Position: Sitting, Cuff Size: Normal)   Pulse 76   Temp 98 F (36.7 C) (Oral)   Resp 12   Ht 4\' 10"  (1.473 m)   Wt 116 lb 14.4 oz (53 kg)   SpO2 97%   BMI 24.43 kg/m   Body mass index is 24.43 kg/m.  Advanced Directives 07/23/2017 11/14/2016 11/02/2016 09/19/2016 08/20/2016 02/07/2016 11/07/2015  Does Patient Have a Medical Advance Directive? Yes Yes Yes Yes No Yes No  Type of Paramedic of [place];Living will [place];Living will - Living will;Healthcare Power of Attorney - Living will -  Copy of [place] in Chart? No - copy requested No - copy requested - - - No - copy requested -  Would patient like information on creating a medical advance directive? - - - - - - No - patient declined information    Tobacco Social History   Tobacco Use  Smoking Status Never Smoker  Smokeless Tobacco Never Used  Tobacco Comment   smoking cesaation materials not required     Counseling given: No Comment: smoking cesaation materials not required   Clinical Intake:  Pre-visit preparation completed: Yes  Pain : No/denies pain   BMI - recorded: 24.43 Nutritional Status: BMI of 19-24  Normal Nutritional Risks: None Diabetes: No  How often do you need to have someone help you when you read instructions, pamphlets, or other written materials from your doctor or pharmacy?: 1 - Never  Interpreter Needed?: No  Information entered by :: AEversole, LPN  Hospitalizations/ED visits and surgeries occurring within the previous 12 months:  Within the previous 12 months, pt has not underwent any surgical procedures, has not been  hospitalized for any conditions and has not been treated by an emergency room clinician.  Past Medical History:  Diagnosis Date  . Allergic rhinitis   . Allergy   . Arthritis   . Bilateral ovarian cysts    laterality not known  . Bunion   . Cataract of both eyes   . Deviated septum   . Elevated BP   . Fibrocystic breast   . GERD (gastroesophageal reflux disease)   . Hammer toe   . History of closed fracture of nasal bones   . History of pneumonia    in her 25's  . Hyperlipidemia   . IFG (impaired fasting glucose)   . Ocular rosacea    see's [facility]  . OSA (obstructive sleep apnea)   . Osteoarthritis cervical spine   . Osteoarthritis of both hands   . Osteoporosis   . Rosacea    telengiectatic  . Shingles    left eye  . Thyroid disease   . Thyroid nodule    left, sees Endocrinology  . Vitamin D deficiency disease    Past Surgical History:  Procedure Laterality Date  . BACK SURGERY     lumbar laminectomy  . BIOPSY THYROID    . BREAST CYST ASPIRATION Left   . BREAST SURGERY    . BUNIONECTOMY Right    Family History  Problem Relation Age of Onset  . Asthma Mother   . Hypertension Mother   . Diabetes Mother   .  Cancer Mother        Leukemia  . Peripheral vascular disease Mother   . Osteoporosis Mother   . Glaucoma Mother   . Heart disease Father        coronary artery occlusion  . Early death Father   . Heart attack Father   . Hypertension Sister   . Diabetes Sister   . Glaucoma Sister   . Diabetes Maternal Grandmother   . Colon cancer Maternal Uncle   . Lung cancer Paternal Uncle    Social History   Socioeconomic History  . Marital status: Divorced    Spouse name: Not on file  . Number of children: 3  . Years of education: some college  . Highest education level: 12th grade  Occupational History  . Occupation: Retires  Social Needs  . Financial resource strain: Not hard at all  . Food insecurity:    Worry: Never true     Inability: Never true  . Transportation needs:    Medical: No    Non-medical: No  Tobacco Use  . Smoking status: Never Smoker  . Smokeless tobacco: Never Used  . Tobacco comment: smoking cesaation materials not required  Substance and Sexual Activity  . Alcohol use: Yes    Alcohol/week: 0.0 oz    Comment: occasional glass of wine  . Drug use: No  . Sexual activity: Not Currently  Lifestyle  . Physical activity:    Days per week: 0 days    Minutes per session: 0 min  . Stress: Not at all  Relationships  . Social connections:    Talks on phone: Patient refused    Gets together: Patient refused    Attends religious service: Patient refused    Active member of club or organization: Patient refused    Attends meetings of clubs or organizations: Patient refused    Relationship status: Divorced  Other Topics Concern  . Not on file  Social History Narrative  . Not on file    Outpatient Encounter Medications as of 07/23/2017  Medication Sig  . aspirin EC 81 MG tablet Take 1 tablet (81 mg total) by mouth daily.  . B Complex-C (SUPER B COMPLEX PO) Take 1 capsule by mouth daily. Reported on 10/27/2015  . beta carotene w/minerals (OCUVITE) tablet Take 1 tablet by mouth daily. Reported on 10/27/2015  . calcium carbonate (OS-CAL) 600 MG TABS tablet Take 600 mg by mouth 2 (two) times daily with a meal. Reported on 10/27/2015  . cholecalciferol (VITAMIN D) 1000 UNITS tablet Take 1,000 Units by mouth daily. Reported on 10/27/2015  . ciclesonide (OMNARIS) 50 MCG/ACT nasal spray Place 2 sprays into both nostrils daily. Reported on 10/27/2015  . diphenhydrAMINE (BENADRYL) 25 mg capsule Take 25 mg by mouth every 4 (four) hours as needed. Reported on 10/27/2015  . doxycycline (VIBRAMYCIN) 50 MG capsule Take 50 mg by mouth daily.   Marland Kitchen levothyroxine (SYNTHROID, LEVOTHROID) 75 MCG tablet Take 1 tablet (75 mcg total) by mouth daily.  . mometasone (NASONEX) 50 MCG/ACT nasal spray INSTILL 2 SPRAYS INTO EACH  NOSTRIL prn  . Multiple Vitamin (MULTIVITAMIN) capsule Take 1 capsule by mouth daily. Reported on 10/27/2015  . Omega-3 Fatty Acids (FISH OIL PO) Take 2 capsules by mouth daily. Reported on 10/27/2015  . PAZEO 0.7 % SOLN Place 1 drop into both eyes daily. Reported on 10/27/2015  . Probiotic Product (PROBIOTIC DAILY PO) Take 1 capsule by mouth as needed. Reported on 10/27/2015  . vitamin B-12 (CYANOCOBALAMIN)  100 MCG tablet Take 100 mcg by mouth daily. Reported on 10/27/2015  . vitamin E 400 UNIT capsule Take 400 Units by mouth daily. Reported on 10/27/2015  . [DISCONTINUED] Cranberry 1000 MG CAPS Take by mouth.  . [DISCONTINUED] Lysine 1000 MG TABS Take 1 tablet by mouth as needed. Reported on 10/27/2015  . [DISCONTINUED] sulfamethoxazole-trimethoprim (BACTRIM DS) 800-160 MG tablet Take 1 tablet by mouth 2 (two) times daily. No salt substitutes, no black licorice, no black jelly (Patient not taking: Reported on 07/02/2017)  . [DISCONTINUED] vitamin C (ASCORBIC ACID) 500 MG tablet Take 500 mg by mouth daily. Reported on 10/27/2015   No facility-administered encounter medications on file as of 07/23/2017.     Activities of Daily Living In your present state of health, do you have any difficulty performing the following activities: 07/23/2017 06/24/2017  Hearing? N N  Comment denies hearing aids -  Vision? N Y  Comment wears eyeglasses; scar tissue L eye; occular rosacea; cataracts -  Difficulty concentrating or making decisions? Y N  Comment short term memory loss -  Walking or climbing stairs? N N  Dressing or bathing? N N  Doing errands, shopping? N N  Preparing Food and eating ? N -  Comment denies dentures -  Using the Toilet? N -  In the past six months, have you accidently leaked urine? Y -  Comment urgency -  Do you have problems with loss of bowel control? N -  Managing your Medications? N -  Managing your Finances? N -  Housekeeping or managing your Housekeeping? N -  Some recent data  might be hidden    Patient Care Team: Lada, Satira Anis, MD as PCP - General (Family Medicine) Corey Skains, MD as Consulting Physician (Cardiology) Anell Barr, OD as Consulting Physician (Optometry) Dingeldein, Remo Lipps, MD as Consulting Physician (Ophthalmology) Albertine Patricia, DPM as Consulting Physician (Podiatry) Jannet Mantis, MD as Consulting Physician (Dermatology) Carloyn Manner, MD as Referring Physician (Otolaryngology)    Assessment:   This is a routine wellness examination for [place].  Exercise Activities and Dietary recommendations Current Exercise Habits: The patient does not participate in regular exercise at present, Exercise limited by: Other - see comments(L bunion in need of surgery)  Goals    . DIET - INCREASE WATER INTAKE     Recommend to drink at least 6-8 8oz glasses of water per day.       Fall Risk Fall Risk  07/23/2017 06/24/2017 02/21/2017 11/02/2016 09/19/2016  Falls in the past year? No No No No No  Risk for fall due to : Impaired vision;Other (Comment) - - - -  Risk for fall due to: Comment wears eyeglasses; scar tissue L eye; occular rosacea; cataracts; has a bunion that makes it difficult for her to bear weight on her foot - - - -   Is the home free of loose throw rugs in walkways, pet beds, electrical cords, etc? Yes Adequate lighting to reduce risk of falls?  Yes In addition, does the patient have any of the following: Stairs in or around the home WITH handrails? No Grab bars in the bathroom? No  Shower chair or a place to sit while bathing? No Use of an elevated toilet seat or a handicapped toilet? No Use of a cane, walker or w/c? No  Timed Get Up and Go Performed: Yes. Pt ambulated 10 feet within 7 sec. Gait slow, steady and without the use of an assistive device. No intervention required at  this time. Fall risk prevention has been discussed.  Pt declined my offer to send Community Resource Referral to Care Guide for  installation of grab bars in the shower, shower chair or an elevated toilet seat.  Depression Screen PHQ 2/9 Scores 07/23/2017 06/24/2017 02/21/2017 11/02/2016  PHQ - 2 Score 0 0 0 0  PHQ- 9 Score 0 - - -     Cognitive Function     6CIT Screen 07/23/2017  What Year? 0 points  What month? 0 points  What time? 0 points  Count back from 20 0 points  Months in reverse 0 points  Repeat phrase 2 points  Total Score 2    Immunization History  Administered Date(s) Administered  . Influenza, High Dose Seasonal PF 02/21/2017  . Influenza-Unspecified 01/20/2013  . Pneumococcal Conjugate-13 11/02/2013  . Pneumococcal Polysaccharide-23 03/30/2011, 11/02/2013  . Tdap 04/16/2010  . Zoster 06/14/2013    Qualifies for Shingles Vaccine? Yes. Zostavax completed 06/14/13. Due for Shingrix vaccine. Education has been provided regarding the importance of this vaccine. Pt has been advised to call her insurance company to determine her out of pocket expense. Advised she may also receive this vaccine at her local pharmacy or Health Dept. Verbalized acceptance and understanding.  Screening Tests Health Maintenance  Topic Date Due  . INFLUENZA VACCINE  11/14/2017  . MAMMOGRAM  06/27/2018  . TETANUS/TDAP  04/16/2020  . COLONOSCOPY  11/05/2023  . Hepatitis C Screening  Completed  . PNA vac Low Risk Adult  Completed  . DEXA SCAN  Addressed    Cancer Screenings: Lung: Low Dose CT Chest recommended if Age 38-80 years, 30 pack-year currently smoking OR have quit w/in 15years. Patient does not qualify. Breast:  Up to date on Mammogram? Yes. Completed 06/26/17. Repeat every year.   Up to date of Bone Density/Dexa? Yes. Completed 01/30/17. Osteoporotic screenings not required at this time. Completed Reclast approx 2014 and believes she may need to be screened in 2020. Colorectal: Unable to locate report. Pt states it has been "many years" since she had a colonoscopy. Requested to be referred to GI for screening  colonoscopy. Pt is aware that she will receive a call from our office re: her appt. Message sent to referral coordinator for scheduling purposes.  Additional Screenings: Hepatitis C Screening: Completed 11/07/15    Plan:  I have personally reviewed and addressed the Medicare Annual Wellness questionnaire and have noted the following in the patient's chart:  A. Medical and social history B. Use of alcohol, tobacco or illicit drugs  C. Current medications and supplements D. Functional ability and status E.  Nutritional status F.  Physical activity G. Advance directives H. List of other physicians I.  Hospitalizations, surgeries, and ER visits in previous 12 months J.  [place] such as hearing and vision if needed, cognitive and depression L. Referrals and appointments  In addition, I have reviewed and discussed with patient certain preventive protocols, quality metrics, and best practice recommendations. A written personalized care plan for preventive services as well as general preventive health recommendations were provided to patient.  See attached scanned questionnaire for additional information.   Signed,  Aleatha Borer, LPN Nurse Health Advisor

## 2017-07-25 ENCOUNTER — Other Ambulatory Visit: Payer: Self-pay

## 2017-07-25 DIAGNOSIS — Z1211 Encounter for screening for malignant neoplasm of colon: Secondary | ICD-10-CM

## 2017-07-26 ENCOUNTER — Telehealth: Payer: Self-pay

## 2017-07-26 ENCOUNTER — Other Ambulatory Visit: Payer: Self-pay

## 2017-07-26 NOTE — Telephone Encounter (Signed)
Gastroenterology Pre-Procedure Review  Request Date:  Requesting Physician: Dr.   PATIENT REVIEW QUESTIONS: The patient responded to the following health history questions as indicated:    1. Are you having any GI issues? no 2. Do you have a personal history of Polyps? no 3. Do you have a family history of Colon Cancer or Polyps? no 4. Diabetes Mellitus? no 5. Joint replacements in the past 12 months?no 6. Major health problems in the past 3 months?no 7. Any artificial heart valves, MVP, or defibrillator?no    MEDICATIONS & ALLERGIES:    Patient reports the following regarding taking any anticoagulation/antiplatelet therapy:   Plavix, Coumadin, Eliquis, Xarelto, Lovenox, Pradaxa, Brilinta, or Effient? no Aspirin? yes (ASA 81mg )  Patient confirms/reports the following medications:  Current Outpatient Medications  Medication Sig Dispense Refill  . aspirin EC 81 MG tablet Take 1 tablet (81 mg total) by mouth daily. 30 tablet 11  . B Complex-C (SUPER B COMPLEX PO) Take 1 capsule by mouth daily. Reported on 10/27/2015    . beta carotene w/minerals (OCUVITE) tablet Take 1 tablet by mouth daily. Reported on 10/27/2015    . calcium carbonate (OS-CAL) 600 MG TABS tablet Take 600 mg by mouth 2 (two) times daily with a meal. Reported on 10/27/2015    . cholecalciferol (VITAMIN D) 1000 UNITS tablet Take 1,000 Units by mouth daily. Reported on 10/27/2015    . ciclesonide (OMNARIS) 50 MCG/ACT nasal spray Place 2 sprays into both nostrils daily. Reported on 10/27/2015    . diphenhydrAMINE (BENADRYL) 25 mg capsule Take 25 mg by mouth every 4 (four) hours as needed. Reported on 10/27/2015    . doxycycline (VIBRAMYCIN) 50 MG capsule Take 50 mg by mouth daily.   3  . levothyroxine (SYNTHROID, LEVOTHROID) 75 MCG tablet Take 1 tablet (75 mcg total) by mouth daily. 90 tablet 3  . mometasone (NASONEX) 50 MCG/ACT nasal spray INSTILL 2 SPRAYS INTO EACH NOSTRIL prn  3  . Multiple Vitamin (MULTIVITAMIN) capsule Take  1 capsule by mouth daily. Reported on 10/27/2015    . Omega-3 Fatty Acids (FISH OIL PO) Take 2 capsules by mouth daily. Reported on 10/27/2015    . PAZEO 0.7 % SOLN Place 1 drop into both eyes daily. Reported on 10/27/2015  5  . Probiotic Product (PROBIOTIC DAILY PO) Take 1 capsule by mouth as needed. Reported on 10/27/2015    . vitamin B-12 (CYANOCOBALAMIN) 100 MCG tablet Take 100 mcg by mouth daily. Reported on 10/27/2015    . vitamin E 400 UNIT capsule Take 400 Units by mouth daily. Reported on 10/27/2015     No current facility-administered medications for this visit.     Patient confirms/reports the following allergies:  Allergies  Allergen Reactions  . Codeine Other (See Comments)    Ears ringing  . Lovastatin Rash  . Nickel Rash  . Tape Rash    No orders of the defined types were placed in this encounter.   AUTHORIZATION INFORMATION Primary Insurance: 1D#: Group #:  Secondary Insurance: 1D#: Group #:  SCHEDULE INFORMATION: Date: 08/14/17 Time: Location: [place]

## 2017-07-28 ENCOUNTER — Other Ambulatory Visit: Payer: Self-pay | Admitting: Family Medicine

## 2017-07-28 NOTE — Telephone Encounter (Signed)
Lab Results  Component Value Date   TSH 0.89 02/21/2017

## 2017-07-31 ENCOUNTER — Ambulatory Visit (INDEPENDENT_AMBULATORY_CARE_PROVIDER_SITE_OTHER): Payer: Medicare Other | Admitting: Family Medicine

## 2017-07-31 ENCOUNTER — Encounter: Payer: Self-pay | Admitting: Family Medicine

## 2017-07-31 ENCOUNTER — Other Ambulatory Visit: Payer: Self-pay

## 2017-07-31 DIAGNOSIS — Z789 Other specified health status: Secondary | ICD-10-CM | POA: Diagnosis not present

## 2017-07-31 DIAGNOSIS — K219 Gastro-esophageal reflux disease without esophagitis: Secondary | ICD-10-CM | POA: Insufficient documentation

## 2017-07-31 DIAGNOSIS — M791 Myalgia, unspecified site: Secondary | ICD-10-CM | POA: Diagnosis not present

## 2017-07-31 DIAGNOSIS — Z0000 Encounter for general adult medical examination without abnormal findings: Secondary | ICD-10-CM

## 2017-07-31 DIAGNOSIS — T466X5A Adverse effect of antihyperlipidemic and antiarteriosclerotic drugs, initial encounter: Secondary | ICD-10-CM | POA: Diagnosis not present

## 2017-07-31 DIAGNOSIS — I10 Essential (primary) hypertension: Secondary | ICD-10-CM | POA: Insufficient documentation

## 2017-07-31 NOTE — Patient Instructions (Addendum)
Please do see Dr. Gabriel Carina in November 2019  Consider getting the new shingles vaccine called Shingrix; that is available for individuals 74 years of age and older, and is recommended even if you have had shingles in the past and/or already received the old shingles vaccine (Zostavax); it is a two-part series, and is available at many local pharmacies   Health Maintenance, Female Adopting a healthy lifestyle and getting preventive care can go a long way to promote health and wellness. Talk with your health care provider about what schedule of regular examinations is right for you. This is a good chance for you to check in with your provider about disease prevention and staying healthy. In between checkups, there are plenty of things you can do on your own. Experts have done a lot of research about which lifestyle changes and preventive measures are most likely to keep you healthy. Ask your health care provider for more information. Weight and diet Eat a healthy diet  Be sure to include plenty of vegetables, fruits, low-fat dairy products, and lean protein.  Do not eat a lot of foods high in solid fats, added sugars, or salt.  Get regular exercise. This is one of the most important things you can do for your health. ? Most adults should exercise for at least 150 minutes each week. The exercise should increase your heart rate and make you sweat (moderate-intensity exercise). ? Most adults should also do strengthening exercises at least twice a week. This is in addition to the moderate-intensity exercise.  Maintain a healthy weight  Body mass index (BMI) is a measurement that can be used to identify possible weight problems. It estimates body fat based on height and weight. Your health care provider can help determine your BMI and help you achieve or maintain a healthy weight.  For females 82 years of age and older: ? A BMI below 18.5 is considered underweight. ? A BMI of 18.5 to 24.9 is  normal. ? A BMI of 25 to 29.9 is considered overweight. ? A BMI of 30 and above is considered obese.  Watch levels of cholesterol and blood lipids  You should start having your blood tested for lipids and cholesterol at 74 years of age, then have this test every 5 years.  You may need to have your cholesterol levels checked more often if: ? Your lipid or cholesterol levels are high. ? You are older than 74 years of age. ? You are at high risk for heart disease.  Cancer screening Lung Cancer  Lung cancer screening is recommended for adults 65-8 years old who are at high risk for lung cancer because of a history of smoking.  A yearly low-dose CT scan of the lungs is recommended for people who: ? Currently smoke. ? Have quit within the past 15 years. ? Have at least a 30-pack-year history of smoking. A pack year is smoking an average of one pack of cigarettes a day for 1 year.  Yearly screening should continue until it has been 15 years since you quit.  Yearly screening should stop if you develop a health problem that would prevent you from having lung cancer treatment.  Breast Cancer  Practice breast self-awareness. This means understanding how your breasts normally appear and feel.  It also means doing regular breast self-exams. Let your health care provider know about any changes, no matter how small.  If you are in your 20s or 30s, you should have a clinical breast exam (CBE)  by a health care provider every 1-3 years as part of a regular health exam.  If you are 40 or older, have a CBE every year. Also consider having a breast X-ray (mammogram) every year.  If you have a family history of breast cancer, talk to your health care provider about genetic screening.  If you are at high risk for breast cancer, talk to your health care provider about having an MRI and a mammogram every year.  Breast cancer gene (BRCA) assessment is recommended for women who have family members  with BRCA-related cancers. BRCA-related cancers include: ? Breast. ? Ovarian. ? Tubal. ? Peritoneal cancers.  Results of the assessment will determine the need for genetic counseling and BRCA1 and BRCA2 testing.  Cervical Cancer Your health care provider may recommend that you be screened regularly for cancer of the pelvic organs (ovaries, uterus, and vagina). This screening involves a pelvic examination, including checking for microscopic changes to the surface of your cervix (Pap test). You may be encouraged to have this screening done every 3 years, beginning at age 21.  For women ages 30-65, health care providers may recommend pelvic exams and Pap testing every 3 years, or they may recommend the Pap and pelvic exam, combined with testing for human papilloma virus (HPV), every 5 years. Some types of HPV increase your risk of cervical cancer. Testing for HPV may also be done on women of any age with unclear Pap test results.  Other health care providers may not recommend any screening for nonpregnant women who are considered low risk for pelvic cancer and who do not have symptoms. Ask your health care provider if a screening pelvic exam is right for you.  If you have had past treatment for cervical cancer or a condition that could lead to cancer, you need Pap tests and screening for cancer for at least 20 years after your treatment. If Pap tests have been discontinued, your risk factors (such as having a new sexual partner) need to be reassessed to determine if screening should resume. Some women have medical problems that increase the chance of getting cervical cancer. In these cases, your health care provider may recommend more frequent screening and Pap tests.  Colorectal Cancer  This type of cancer can be detected and often prevented.  Routine colorectal cancer screening usually begins at 74 years of age and continues through 75 years of age.  Your health care provider may recommend  screening at an earlier age if you have risk factors for colon cancer.  Your health care provider may also recommend using home test kits to check for hidden blood in the stool.  A small camera at the end of a tube can be used to examine your colon directly (sigmoidoscopy or colonoscopy). This is done to check for the earliest forms of colorectal cancer.  Routine screening usually begins at age 50.  Direct examination of the colon should be repeated every 5-10 years through 75 years of age. However, you may need to be screened more often if early forms of precancerous polyps or small growths are found.  Skin Cancer  Check your skin from head to toe regularly.  Tell your health care provider about any new moles or changes in moles, especially if there is a change in a mole's shape or color.  Also tell your health care provider if you have a mole that is larger than the size of a pencil eraser.  Always use sunscreen. Apply sunscreen liberally and   repeatedly throughout the day.  Protect yourself by wearing long sleeves, pants, a wide-brimmed hat, and sunglasses whenever you are outside.  Heart disease, diabetes, and high blood pressure  High blood pressure causes heart disease and increases the risk of stroke. High blood pressure is more likely to develop in: ? People who have blood pressure in the high end of the normal range (130-139/85-89 mm Hg). ? People who are overweight or obese. ? People who are African American.  If you are 18-39 years of age, have your blood pressure checked every 3-5 years. If you are 40 years of age or older, have your blood pressure checked every year. You should have your blood pressure measured twice-once when you are at a hospital or clinic, and once when you are not at a hospital or clinic. Record the average of the two measurements. To check your blood pressure when you are not at a hospital or clinic, you can use: ? An automated blood pressure machine at  a pharmacy. ? A home blood pressure monitor.  If you are between 55 years and 79 years old, ask your health care provider if you should take aspirin to prevent strokes.  Have regular diabetes screenings. This involves taking a blood sample to check your fasting blood sugar level. ? If you are at a normal weight and have a low risk for diabetes, have this test once every three years after 74 years of age. ? If you are overweight and have a high risk for diabetes, consider being tested at a younger age or more often. Preventing infection Hepatitis B  If you have a higher risk for hepatitis B, you should be screened for this virus. You are considered at high risk for hepatitis B if: ? You were born in a country where hepatitis B is common. Ask your health care provider which countries are considered high risk. ? Your parents were born in a high-risk country, and you have not been immunized against hepatitis B (hepatitis B vaccine). ? You have HIV or AIDS. ? You use needles to inject street drugs. ? You live with someone who has hepatitis B. ? You have had sex with someone who has hepatitis B. ? You get hemodialysis treatment. ? You take certain medicines for conditions, including cancer, organ transplantation, and autoimmune conditions.  Hepatitis C  Blood testing is recommended for: ? Everyone born from 1945 through 1965. ? Anyone with known risk factors for hepatitis C.  Sexually transmitted infections (STIs)  You should be screened for sexually transmitted infections (STIs) including gonorrhea and chlamydia if: ? You are sexually active and are younger than 74 years of age. ? You are older than 74 years of age and your health care provider tells you that you are at risk for this type of infection. ? Your sexual activity has changed since you were last screened and you are at an increased risk for chlamydia or gonorrhea. Ask your health care provider if you are at risk.  If you do  not have HIV, but are at risk, it may be recommended that you take a prescription medicine daily to prevent HIV infection. This is called pre-exposure prophylaxis (PrEP). You are considered at risk if: ? You are sexually active and do not regularly use condoms or know the HIV status of your partner(s). ? You take drugs by injection. ? You are sexually active with a partner who has HIV.  Talk with your health care provider about whether you   are at high risk of being infected with HIV. If you choose to begin PrEP, you should first be tested for HIV. You should then be tested every 3 months for as long as you are taking PrEP. Pregnancy  If you are premenopausal and you may become pregnant, ask your health care provider about preconception counseling.  If you may become pregnant, take 400 to 800 micrograms (mcg) of folic acid every day.  If you want to prevent pregnancy, talk to your health care provider about birth control (contraception). Osteoporosis and menopause  Osteoporosis is a disease in which the bones lose minerals and strength with aging. This can result in serious bone fractures. Your risk for osteoporosis can be identified using a bone density scan.  If you are 65 years of age or older, or if you are at risk for osteoporosis and fractures, ask your health care provider if you should be screened.  Ask your health care provider whether you should take a calcium or vitamin D supplement to lower your risk for osteoporosis.  Menopause may have certain physical symptoms and risks.  Hormone replacement therapy may reduce some of these symptoms and risks. Talk to your health care provider about whether hormone replacement therapy is right for you. Follow these instructions at home:  Schedule regular health, dental, and eye exams.  Stay current with your immunizations.  Do not use any tobacco products including cigarettes, chewing tobacco, or electronic cigarettes.  If you are  pregnant, do not drink alcohol.  If you are breastfeeding, limit how much and how often you drink alcohol.  Limit alcohol intake to no more than 1 drink per day for nonpregnant women. One drink equals 12 ounces of beer, 5 ounces of wine, or 1 ounces of hard liquor.  Do not use street drugs.  Do not share needles.  Ask your health care provider for help if you need support or information about quitting drugs.  Tell your health care provider if you often feel depressed.  Tell your health care provider if you have ever been abused or do not feel safe at home. This information is not intended to replace advice given to you by your health care provider. Make sure you discuss any questions you have with your health care provider. Document Released: 10/16/2010 Document Revised: 09/08/2015 Document Reviewed: 01/04/2015 Elsevier Interactive Patient Education  2018 [employer]  

## 2017-07-31 NOTE — Progress Notes (Signed)
BP 116/72 (BP Location: Left Arm, Patient Position: Sitting, Cuff Size: Normal)   Pulse 82   Temp 98 F (36.7 C) (Oral)   Ht 4\' 10"  (1.473 m)   Wt 117 lb (53.1 kg)   SpO2 99%   BMI 24.45 kg/m    Subjective:    Patient ID: Angela Kelly, female    DOB: [birth year], 72 y.o.   MRN: [id number]  HPI: Angela Kelly is a 72 y.o. female  Chief Complaint  Patient presents with  . Medicare Wellness    Saw Ammie recently for [facility]     HPI She just saw Ammie for her [place] Needs to find someone to have colonoscopy; last July 2015;  Skin cancer: no worrisome moles Lung cancer: never smoker Breast cancer: just had mammo Cervical cancer: graduated Will get living will to [place]; doesn't have HCPOA She wants to be a full code Cholesterol; cannot take statins 0.9 cm thyroid nodule, seeing Dr. Gabriel Carina May 2018 was last visit; repeat US in 18 months Immunizations reviewed; shingrix discussed  Depression screen [facility] 2/9 07/31/2017 07/23/2017 06/24/2017 02/21/2017 11/02/2016  Decreased Interest 0 0 0 0 0  Down, Depressed, Hopeless 0 0 0 0 0  PHQ - 2 Score 0 0 0 0 0  Altered sleeping - 0 - - -  Tired, decreased energy - 0 - - -  Change in appetite - 0 - - -  Feeling bad or failure about yourself  - 0 - - -  Trouble concentrating - 0 - - -  Moving slowly or fidgety/restless - 0 - - -  Suicidal thoughts - 0 - - -  PHQ-9 Score - 0 - - -  Difficult doing work/chores - Not difficult at all - - -    Relevant past medical, surgical, family and social history reviewed Past Medical History:  Diagnosis Date  . Allergic rhinitis   . Allergy   . Arthritis   . Bilateral ovarian cysts    laterality not known  . Bunion   . Cataract of both eyes   . Deviated septum   . Elevated BP   . Fibrocystic breast   . GERD (gastroesophageal reflux disease)   . Hammer toe   . History of closed fracture of nasal bones   . History of pneumonia    in her 48's  . Hyperlipidemia   . IFG (impaired fasting glucose)     . Ocular rosacea    see's [facility]  . OSA (obstructive sleep apnea)   . Osteoarthritis cervical spine   . Osteoarthritis of both hands   . Osteoporosis   . Rosacea    telengiectatic  . Shingles    left eye  . Thyroid disease   . Thyroid nodule    left, sees Endocrinology  . Vitamin D deficiency disease    Past Surgical History:  Procedure Laterality Date  . BACK SURGERY     lumbar laminectomy  . BIOPSY THYROID    . BREAST CYST ASPIRATION Left   . BREAST SURGERY    . BUNIONECTOMY Right    Family History  Problem Relation Age of Onset  . Asthma Mother   . Hypertension Mother   . Diabetes Mother   . Cancer Mother        Leukemia  . Peripheral vascular disease Mother   . Osteoporosis Mother   . Glaucoma Mother   . Heart disease Father        coronary  artery occlusion  . Early death Father   . Heart attack Father   . Hypertension Sister   . Diabetes Sister   . Glaucoma Sister   . Diabetes Maternal Grandmother   . Colon cancer Maternal Uncle   . Lung cancer Paternal Uncle    Social History   Tobacco Use  . Smoking status: Never Smoker  . Smokeless tobacco: Never Used  . Tobacco comment: smoking cesaation materials not required  Substance Use Topics  . Alcohol use: Yes    Alcohol/week: 0.0 oz    Comment: occasional glass of wine  . Drug use: No    Interim medical history since last visit reviewed. Allergies and medications reviewed  Review of Systems  Constitutional: Negative for unexpected weight change.  HENT: Positive for postnasal drip and rhinorrhea. Negative for hearing loss.        Allergies  Eyes: Negative for visual disturbance.       Ongoing issues with eyes, Dr. Jeni Salles  Respiratory: Negative for shortness of breath.   Cardiovascular: Negative for chest pain.  Gastrointestinal: Negative for blood in stool.  Endocrine: Positive for polydipsia.       Drinks plenty of water  Genitourinary: Negative for hematuria.       Issues  with kidneys and all resolved  Musculoskeletal: Positive for arthralgias (just a little, getting up and down out of chairs).  Skin:       Nothing worrisome in regards to moles; acne and thinks allergy to nickel which was in her multivitamin  Allergic/Immunologic:       Nickel was in her vitamins; stopped that  Neurological: Negative for tremors.  Hematological: Does not bruise/bleed easily.  Psychiatric/Behavioral: Negative for dysphoric mood.   Per HPI unless specifically indicated above     Objective:    BP 116/72 (BP Location: Left Arm, Patient Position: Sitting, Cuff Size: Normal)   Pulse 82   Temp 98 F (36.7 C) (Oral)   Ht 4\' 10"  (1.473 m)   Wt 117 lb (53.1 kg)   SpO2 99%   BMI 24.45 kg/m   Wt Readings from Last 3 Encounters:  07/31/17 117 lb (53.1 kg)  07/23/17 116 lb 14.4 oz (53 kg)  07/02/17 114 lb 9.6 oz (52 kg)    Physical Exam  Constitutional: She appears well-developed and well-nourished. No distress.  HENT:  Head: Normocephalic and atraumatic.  Eyes: EOM are normal. No scleral icterus.  Neck: No thyromegaly present.  Cardiovascular: Normal rate, regular rhythm and normal heart sounds.  No murmur heard. Pulmonary/Chest: Effort normal and breath sounds normal. No respiratory distress. She has no wheezes.  Abdominal: Soft. Bowel sounds are normal. She exhibits no distension.  Musculoskeletal: She exhibits no edema.  Neurological: She is alert. She exhibits normal muscle tone.  Skin: Skin is warm and dry. She is not diaphoretic. No pallor.  Psychiatric: She has a normal mood and affect. Her behavior is normal. Judgment and thought content normal.      Assessment & Plan:   Problem List Items Addressed This Visit      Other   Preventative health care    .healthy living encouraged      Myalgia due to HMG CoA reductase inhibitor    Do not give statins      Full code status    Discussed; HCPOA paperwork given; she'll return living will and other papers  to [place] for the record      Relevant Orders   Full code  Follow up plan: Return in about 1 year (around 08/01/2018) for complete physical if covered by insurance.  An after-visit summary was printed and given to the patient at [place].  Please see the patient instructions which may contain other information and recommendations beyond what is mentioned above in the assessment and plan.  No orders of the defined types were placed in this encounter.   Orders Placed This Encounter  Procedures  . Full code

## 2017-07-31 NOTE — Assessment & Plan Note (Signed)
.  healthy living encouraged

## 2017-07-31 NOTE — Assessment & Plan Note (Signed)
Do not give statins

## 2017-07-31 NOTE — Assessment & Plan Note (Signed)
Discussed; HCPOA paperwork given; she'll return living will and other papers to [place] for the record

## 2017-08-14 ENCOUNTER — Ambulatory Visit: Admit: 2017-08-14 | Payer: Medicare Other | Admitting: Gastroenterology

## 2017-08-14 SURGERY — COLONOSCOPY WITH PROPOFOL
Anesthesia: General

## 2017-08-15 ENCOUNTER — Other Ambulatory Visit: Payer: Self-pay | Admitting: Family Medicine

## 2017-08-22 ENCOUNTER — Other Ambulatory Visit: Payer: Self-pay

## 2017-08-22 MED ORDER — MOMETASONE FUROATE 50 MCG/ACT NA SUSP: 2.0000 | Freq: Every day | NASAL | 11 refills | Status: DC

## 2017-09-04 ENCOUNTER — Encounter: Payer: Self-pay | Admitting: Nurse Practitioner

## 2017-09-04 ENCOUNTER — Ambulatory Visit (INDEPENDENT_AMBULATORY_CARE_PROVIDER_SITE_OTHER): Payer: Medicare Other | Admitting: Nurse Practitioner

## 2017-09-04 VITALS — BP 116/78 | HR 88 | Temp 98.2°F | Resp 16 | Ht <= 58 in | Wt 116.6 lb

## 2017-09-04 DIAGNOSIS — K5909 Other constipation: Secondary | ICD-10-CM | POA: Diagnosis not present

## 2017-09-04 DIAGNOSIS — M549 Dorsalgia, unspecified: Secondary | ICD-10-CM

## 2017-09-04 LAB — COMPLETE METABOLIC PANEL WITH GFR
AG Ratio: 1.6 (calc) (ref 1.0–2.5)
ALT: 13 U/L (ref 6–29)
AST: 21 U/L (ref 10–35)
Albumin: 3.9 g/dL (ref 3.6–5.1)
Alkaline phosphatase (APISO): 92 U/L (ref 33–130)
BUN: 11 mg/dL (ref 7–25)
CO2: 31 mmol/L (ref 20–32)
Calcium: 9 mg/dL (ref 8.6–10.4)
Chloride: 101 mmol/L (ref 98–110)
Creat: 0.82 mg/dL (ref 0.60–0.93)
GFR, Est African American: 82 mL/min/{1.73_m2} (ref >=60)
GFR, Est Non African American: 70 mL/min/{1.73_m2} (ref >=60)
Globulin: 2.4 g/dL (calc) (ref 1.9–3.7)
Glucose, Bld: 88 mg/dL (ref 65–99)
Potassium: 4 mmol/L (ref 3.5–5.3)
Sodium: 139 mmol/L (ref 135–146)
Total Bilirubin: 0.4 mg/dL (ref 0.2–1.2)
Total Protein: 6.3 g/dL (ref 6.1–8.1)

## 2017-09-04 LAB — POCT URINALYSIS DIPSTICK
Bilirubin, UA: NEGATIVE
Blood, UA: NEGATIVE
Glucose, UA: NEGATIVE
Leukocytes, UA: NEGATIVE
Nitrite, UA: NEGATIVE
Protein, UA: NEGATIVE
Spec Grav, UA: 1.01 (ref 1.010–1.025)
Urobilinogen, UA: NEGATIVE E.U./dL — AB
pH, UA: 7 (ref 5.0–8.0)

## 2017-09-04 NOTE — Progress Notes (Addendum)
Name: Angela Kelly   MRN: [id number]    DOB: October 20, 1945   Date:09/04/2017       Progress Note  Subjective  Chief Complaint  Chief Complaint  Patient presents with  . Flank Pain    ride side, swollen, seen 1 month ago for same issue had normal CT    HPI  Patient presents for right flank pain was seen in march 2019 for same symptoms. Patient stopped long-term doxy for rosacea and placed on bactrim due to positive trace leukocytes- culture was negative, normal cbc and bmp at 2 week follow-up flank pain was improved but still present. CT was completed on 3/19 showing no acute intra-abdominal or intrapelvic abnormalities.  Symptoms went away a few days the end of march and then returned in the past few weeks started mild and progressively worsened. 5-6 days ago was at its worst, has mildly improved since then but still present. Patient endorses associated belching after meals and then through out the day. Patient tried apple cider vinegar sts with mild relief of belching. Patient states has pain is constant but flares up after eating. Patient switched from almond milk to regular milk in the mornings back in April and not sure if it is related. Denies nausea, vomiting, diarrhea. States baseline constipation- uses suppositories daily for a couple of years. States pain is also worse when she lays down or sit. Patient has tried ice- no relief, heat- only helps when its there. Patient sts notes swelling to the right sided ongoing for 2-3 months.  Denies blood in stools, dysuria, hematuria. Pt denies straining or heavy lifting, had back surgery 50 years ago due to slipped disc no issues since then.   Pt abides by gluten-free diet (due to cysts) doesn't eat a lot of meat- when she does its lean meat. Allergic to nickel.     Patient Active Problem List   Diagnosis Date Noted  . Essential hypertension 07/31/2017  . GERD (gastroesophageal reflux disease) 07/31/2017  . Full code status 07/31/2017  .  Myalgia due to HMG CoA reductase inhibitor 07/31/2017  . Need for hepatitis C screening test 11/07/2015  . Preventative health care 11/07/2015  . Nausea 10/27/2015  . Chest pain, unspecified 10/27/2015  . Medication monitoring encounter 08/05/2015  . Abnormal mammogram of left breast 01/18/2015  . OSA (obstructive sleep apnea)   . Hyperlipidemia   . Thyroid disease   . Cataract of both eyes   . Ocular rosacea   . Bunion   . Hammer toe   . Allergic rhinitis   . Hypothyroidism 08/03/2014  . Left thyroid nodule 08/03/2014  . IFG (impaired fasting glucose) 08/03/2014  . Osteopenia 08/03/2014  . SOB (shortness of breath) 11/05/2013  . Anemia, unspecified 08/21/2013  . Depression 08/21/2013  . Osteoarthritis 08/21/2013    Past Medical History:  Diagnosis Date  . Allergic rhinitis   . Allergy   . Arthritis   . Bilateral ovarian cysts    laterality not known  . Bunion   . Cataract of both eyes   . Deviated septum   . Elevated BP   . Fibrocystic breast   . GERD (gastroesophageal reflux disease)   . Hammer toe   . History of closed fracture of nasal bones   . History of pneumonia    in her 52's  . Hyperlipidemia   . IFG (impaired fasting glucose)   . Ocular rosacea    see's [facility]  . OSA (obstructive sleep apnea)   .  Osteoarthritis cervical spine   . Osteoarthritis of both hands   . Osteoporosis   . Rosacea    telengiectatic  . Shingles    left eye  . Thyroid disease   . Thyroid nodule    left, sees Endocrinology  . Vitamin D deficiency disease     Past Surgical History:  Procedure Laterality Date  . BACK SURGERY     lumbar laminectomy  . BIOPSY THYROID    . BREAST CYST ASPIRATION Left   . BREAST SURGERY    . BUNIONECTOMY Right     Social History   Tobacco Use  . Smoking status: Never Smoker  . Smokeless tobacco: Never Used  . Tobacco comment: smoking cesaation materials not required  Substance Use Topics  . Alcohol use: Yes     Alcohol/week: 0.0 oz    Comment: occasional glass of wine     Current Outpatient Medications:  .  aspirin EC 81 MG tablet, Take 1 tablet (81 mg total) by mouth daily., Disp: 30 tablet, Rfl: 11 .  b complex vitamins capsule, Take by mouth., Disp: , Rfl:  .  B Complex-C (SUPER B COMPLEX PO), Take 1 capsule by mouth daily. Reported on 10/27/2015, Disp: , Rfl:  .  beta carotene w/minerals (OCUVITE) tablet, Take 1 tablet by mouth daily. Reported on 10/27/2015, Disp: , Rfl:  .  calcium carbonate (OS-CAL) 600 MG TABS tablet, Take 600 mg by mouth 2 (two) times daily with a meal. Reported on 10/27/2015, Disp: , Rfl:  .  cholecalciferol (VITAMIN D) 1000 UNITS tablet, Take 1,000 Units by mouth daily. Reported on 10/27/2015, Disp: , Rfl:  .  ciclesonide (OMNARIS) 50 MCG/ACT nasal spray, Place 2 sprays into both nostrils daily. Reported on 10/27/2015, Disp: , Rfl:  .  diphenhydrAMINE (BENADRYL) 25 mg capsule, Take 25 mg by mouth every 4 (four) hours as needed. Reported on 10/27/2015, Disp: , Rfl:  .  levothyroxine (SYNTHROID, LEVOTHROID) 75 MCG tablet, TAKE 1 TABLET BY MOUTH EVERY DAY, Disp: 90 tablet, Rfl: 2 .  mometasone (NASONEX) 50 MCG/ACT nasal spray, Place 2 sprays into the nose daily., Disp: 17 g, Rfl: 11 .  Multiple Vitamin (MULTIVITAMIN) capsule, Take 1 capsule by mouth daily. Reported on 10/27/2015, Disp: , Rfl:  .  Omega-3 Fatty Acids (FISH OIL PO), Take 2 capsules by mouth daily. Reported on 10/27/2015, Disp: , Rfl:  .  PAZEO 0.7 % SOLN, Place 1 drop into both eyes daily. Reported on 10/27/2015, Disp: , Rfl: 5 .  vitamin B-12 (CYANOCOBALAMIN) 100 MCG tablet, Take 100 mcg by mouth daily. Reported on 10/27/2015, Disp: , Rfl:  .  vitamin E 400 UNIT capsule, Take 400 Units by mouth daily. Reported on 10/27/2015, Disp: , Rfl:  .  doxycycline (VIBRAMYCIN) 50 MG capsule, Take 50 mg by mouth daily. , Disp: , Rfl: 3 .  Probiotic Product (PROBIOTIC DAILY PO), Take 1 capsule by mouth as needed. Reported on 10/27/2015,  Disp: , Rfl:   Allergies  Allergen Reactions  . Codeine Other (See Comments)    Ears ringing  . Lovastatin Rash  . Nickel Rash  . Tape Rash    ROS  Constitutional: Negative for fever or weight change.  Respiratory: Negative for cough and shortness of breath.   Cardiovascular: Negative for chest pain or palpitations.  Gastrointestinal: Positive for abdominal pain, no bowel changes.  Musculoskeletal: Negative for gait problem or joint swelling.  Skin: Negative for rash.  Neurological: Negative for dizziness or headache.  No  other specific complaints in a complete review of systems (except as listed in HPI above).  Objective  Vitals:   09/04/17 1053  BP: 116/78  Pulse: 88  Resp: 16  Temp: 98.2 F (36.8 C)  TempSrc: Oral  SpO2: 95%  Weight: 116 lb 9.6 oz (52.9 kg)  Height: 4\' 10"  (1.473 m)    Body mass index is 24.37 kg/m.  Nursing Note and Vital Signs reviewed.  Physical Exam  Constitutional: Patient appears well-developed and well-nourished. No distress.  Cardiovascular: Normal rate, regular rhythm, S1/S2 present.  No murmur or rub heard.  Pulmonary/Chest: Effort normal and breath sounds clear. No respiratory distress or retractions. Abdominal: Soft and non-tender, bowel sounds present, no CVA tenderness, negative murphys for me, mildly positive with Lada MD, no other tenderness noted, no masses palpated, no rash noted MSK: right mid back skin fold significantly more pronounced on right side- no other obvious swelling, some pain with bending to the right, no obvious deformity or tenderness with palpation light and deep Psychiatric: Patient has a normal mood and affect. behavior is normal. Judgment and thought content normal.  No results found for this or any previous visit (from the past 72 hour(s)).  Assessment & Plan  1. Mid back pain on right side - continue drinking plenty of water, tylenol as needed, heat/ice and back streches - POCT Urinalysis Dipstick:  trace ketones noted in urine patient notes had significantly reduced carb intake will recheck in one week.  - US Abdomen Limited RUQ; Future - discussed ER precautions, follow up in one week    2. Chronic constipation Avoid using suppositories daily as they are not healthy for your gut health. Daily bowel movements are not the norm or necessary for health - Increase fluid and fiber intake and try to defecate after meals when your colon is naturally more active - First cut down on daily suppository use and switch to every other day, then if you are having bowel movements without them start using them less and less   Face-to-face time with patient was more than 25 minutes, >50% time spent counseling and coordination of care -Red flags and when to present for emergency care or RTC including fever >101.51F, chest pain, shortness of breath, new/worsening/un-resolving symptoms,  reviewed with patient at time of visit. Follow up and care instructions discussed and provided in AVS.  --------------------------------------------- I have reviewed this encounter including the documentation in this note and/or discussed this patient with the provider, Suezanne Cheshire DNP AGNP-C. I am certifying that I agree with the content of this note as supervising physician. Enid Derry, [facility] 09/05/2017, 6:04 PM

## 2017-09-04 NOTE — Patient Instructions (Addendum)
For constipation: Avoid using suppositories daily as they are not healthy for your gut health. Daily bowel movements are not the norm or necessary for health - Increase fluid and fiber intake and try to defecate after meals when your colon is naturally more active - First cut down on daily suppository use and switch to every other day, then if you are having bowel movements without them start using them less and less   Back Exercises If you have pain in your back, do these exercises 2-3 times each day or as told by your doctor. When the pain goes away, do the exercises once each day, but repeat the steps more times for each exercise (do more repetitions). If you do not have pain in your back, do these exercises once each day or as told by your doctor. Exercises Single Knee to Chest  Do these steps 3-5 times in a row for each leg: 1. Lie on your back on a firm bed or the floor with your legs stretched out. 2. Bring one knee to your chest. 3. Hold your knee to your chest by grabbing your knee or thigh. 4. Pull on your knee until you feel a gentle stretch in your lower back. 5. Keep doing the stretch for 10-30 seconds. 6. Slowly let go of your leg and straighten it.  Pelvic Tilt  Do these steps 5-10 times in a row: 1. Lie on your back on a firm bed or the floor with your legs stretched out. 2. Bend your knees so they point up to the ceiling. Your feet should be flat on the floor. 3. Tighten your lower belly (abdomen) muscles to press your lower back against the floor. This will make your tailbone point up to the ceiling instead of pointing down to your feet or the floor. 4. Stay in this position for 5-10 seconds while you gently tighten your muscles and breathe evenly.  Cat-Cow  Do these steps until your lower back bends more easily: 1. Get on your hands and knees on a firm surface. Keep your hands under your shoulders, and keep your knees under your hips. You may put padding under your  knees. 2. Let your head hang down, and make your tailbone point down to the floor so your lower back is round like the back of a cat. 3. Stay in this position for 5 seconds. 4. Slowly lift your head and make your tailbone point up to the ceiling so your back hangs low (sags) like the back of a cow. 5. Stay in this position for 5 seconds.  Press-Ups  Do these steps 5-10 times in a row: 1. Lie on your belly (face-down) on the floor. 2. Place your hands near your head, about shoulder-width apart. 3. While you keep your back relaxed and keep your hips on the floor, slowly straighten your arms to raise the top half of your body and lift your shoulders. Do not use your back muscles. To make yourself more comfortable, you may change where you place your hands. 4. Stay in this position for 5 seconds. 5. Slowly return to lying flat on the floor.  Bridges  Do these steps 10 times in a row: 1. Lie on your back on a firm surface. 2. Bend your knees so they point up to the ceiling. Your feet should be flat on the floor. 3. Tighten your butt muscles and lift your butt off of the floor until your waist is almost as high as your  knees. If you do not feel the muscles working in your butt and the back of your thighs, slide your feet 1-2 inches farther away from your butt. 4. Stay in this position for 3-5 seconds. 5. Slowly lower your butt to the floor, and let your butt muscles relax.  If this exercise is too easy, try doing it with your arms crossed over your chest. Belly Crunches  Do these steps 5-10 times in a row: 1. Lie on your back on a firm bed or the floor with your legs stretched out. 2. Bend your knees so they point up to the ceiling. Your feet should be flat on the floor. 3. Cross your arms over your chest. 4. Tip your chin a little bit toward your chest but do not bend your neck. 5. Tighten your belly muscles and slowly raise your chest just enough to lift your shoulder blades a tiny bit  off of the floor. 6. Slowly lower your chest and your head to the floor.  Back Lifts Do these steps 5-10 times in a row: 1. Lie on your belly (face-down) with your arms at your sides, and rest your forehead on the floor. 2. Tighten the muscles in your legs and your butt. 3. Slowly lift your chest off of the floor while you keep your hips on the floor. Keep the back of your head in line with the curve in your back. Look at the floor while you do this. 4. Stay in this position for 3-5 seconds. 5. Slowly lower your chest and your face to the floor.  Contact a doctor if:  Your back pain gets a lot worse when you do an exercise.  Your back pain does not lessen 2 hours after you exercise. If you have any of these problems, stop doing the exercises. Do not do them again unless your doctor says it is okay. Get help right away if:  You have sudden, very bad back pain. If this happens, stop doing the exercises. Do not do them again unless your doctor says it is okay. This information is not intended to replace advice given to you by your health care provider. Make sure you discuss any questions you have with your health care provider. Document Released: 05/05/2010 Document Revised: 09/08/2015 Document Reviewed: 05/27/2014 Elsevier Interactive Patient Education  [employer].

## 2017-09-11 ENCOUNTER — Encounter: Payer: Self-pay | Admitting: Nurse Practitioner

## 2017-09-11 ENCOUNTER — Ambulatory Visit (INDEPENDENT_AMBULATORY_CARE_PROVIDER_SITE_OTHER): Payer: Medicare Other | Admitting: Nurse Practitioner

## 2017-09-11 VITALS — BP 120/76 | HR 82 | Temp 98.1°F | Resp 16 | Ht <= 58 in | Wt 116.8 lb

## 2017-09-11 DIAGNOSIS — K5909 Other constipation: Secondary | ICD-10-CM | POA: Diagnosis not present

## 2017-09-11 DIAGNOSIS — M549 Dorsalgia, unspecified: Secondary | ICD-10-CM

## 2017-09-11 NOTE — Progress Notes (Addendum)
Name: Angela Kelly   MRN: [id number]    DOB: 07/23/1945   Date:09/11/2017       Progress Note  Subjective  Chief Complaint  Chief Complaint  Patient presents with  . Follow-up    1 week recheck    HPI  Right side pain: has improved with stretching. Was seen by chiropractor 1-2 years ago was told she was out of alignment and it felt similar to now. Patient states when pain was at its worse was 8/10 before last visit and then since then is a 3/10 now. But states sometimes with bending movements pain can get worse. She has also been alternating with ice/heat that helps immediately and for a short time afterwards. Denies nausea, fever, paresthesias in legs.   Bowels have started taking fiber supplement and has gone down from daily suppository use to 3 times a week daily bowel movements without straining.     Patient Active Problem List   Diagnosis Date Noted  . Essential hypertension 07/31/2017  . GERD (gastroesophageal reflux disease) 07/31/2017  . Full code status 07/31/2017  . Myalgia due to HMG CoA reductase inhibitor 07/31/2017  . Need for hepatitis C screening test 11/07/2015  . Preventative health care 11/07/2015  . Nausea 10/27/2015  . Chest pain, unspecified 10/27/2015  . Medication monitoring encounter 08/05/2015  . Abnormal mammogram of left breast 01/18/2015  . OSA (obstructive sleep apnea)   . Hyperlipidemia   . Thyroid disease   . Cataract of both eyes   . Ocular rosacea   . Bunion   . Hammer toe   . Allergic rhinitis   . Hypothyroidism 08/03/2014  . Left thyroid nodule 08/03/2014  . IFG (impaired fasting glucose) 08/03/2014  . Osteopenia 08/03/2014  . SOB (shortness of breath) 11/05/2013  . Anemia, unspecified 08/21/2013  . Depression 08/21/2013  . Osteoarthritis 08/21/2013    Past Medical History:  Diagnosis Date  . Allergic rhinitis   . Allergy   . Arthritis   . Bilateral ovarian cysts    laterality not known  . Bunion   . Cataract of both eyes    . Deviated septum   . Elevated BP   . Fibrocystic breast   . GERD (gastroesophageal reflux disease)   . Hammer toe   . History of closed fracture of nasal bones   . History of pneumonia    in her 67's  . Hyperlipidemia   . IFG (impaired fasting glucose)   . Ocular rosacea    see's [facility]  . OSA (obstructive sleep apnea)   . Osteoarthritis cervical spine   . Osteoarthritis of both hands   . Osteoporosis   . Rosacea    telengiectatic  . Shingles    left eye  . Thyroid disease   . Thyroid nodule    left, sees Endocrinology  . Vitamin D deficiency disease     Past Surgical History:  Procedure Laterality Date  . BACK SURGERY     lumbar laminectomy  . BIOPSY THYROID    . BREAST CYST ASPIRATION Left   . BREAST SURGERY    . BUNIONECTOMY Right     Social History   Tobacco Use  . Smoking status: Never Smoker  . Smokeless tobacco: Never Used  . Tobacco comment: smoking cesaation materials not required  Substance Use Topics  . Alcohol use: Yes    Alcohol/week: 0.0 oz    Comment: occasional glass of wine     Current Outpatient Medications:  .  aspirin EC 81 MG tablet, Take 1 tablet (81 mg total) by mouth daily., Disp: 30 tablet, Rfl: 11 .  b complex vitamins capsule, Take by mouth., Disp: , Rfl:  .  B Complex-C (SUPER B COMPLEX PO), Take 1 capsule by mouth daily. Reported on 10/27/2015, Disp: , Rfl:  .  beta carotene w/minerals (OCUVITE) tablet, Take 1 tablet by mouth daily. Reported on 10/27/2015, Disp: , Rfl:  .  calcium carbonate (OS-CAL) 600 MG TABS tablet, Take 600 mg by mouth 2 (two) times daily with a meal. Reported on 10/27/2015, Disp: , Rfl:  .  cholecalciferol (VITAMIN D) 1000 UNITS tablet, Take 1,000 Units by mouth daily. Reported on 10/27/2015, Disp: , Rfl:  .  ciclesonide (OMNARIS) 50 MCG/ACT nasal spray, Place 2 sprays into both nostrils daily. Reported on 10/27/2015, Disp: , Rfl:  .  diphenhydrAMINE (BENADRYL) 25 mg capsule, Take 25 mg by mouth  every 4 (four) hours as needed. Reported on 10/27/2015, Disp: , Rfl:  .  doxycycline (VIBRAMYCIN) 50 MG capsule, Take 50 mg by mouth daily. , Disp: , Rfl: 3 .  levothyroxine (SYNTHROID, LEVOTHROID) 75 MCG tablet, TAKE 1 TABLET BY MOUTH EVERY DAY, Disp: 90 tablet, Rfl: 2 .  mometasone (NASONEX) 50 MCG/ACT nasal spray, Place 2 sprays into the nose daily., Disp: 17 g, Rfl: 11 .  Multiple Vitamin (MULTIVITAMIN) capsule, Take 1 capsule by mouth daily. Reported on 10/27/2015, Disp: , Rfl:  .  Omega-3 Fatty Acids (FISH OIL PO), Take 2 capsules by mouth daily. Reported on 10/27/2015, Disp: , Rfl:  .  PAZEO 0.7 % SOLN, Place 1 drop into both eyes daily. Reported on 10/27/2015, Disp: , Rfl: 5 .  Probiotic Product (PROBIOTIC DAILY PO), Take 1 capsule by mouth as needed. Reported on 10/27/2015, Disp: , Rfl:  .  vitamin B-12 (CYANOCOBALAMIN) 100 MCG tablet, Take 100 mcg by mouth daily. Reported on 10/27/2015, Disp: , Rfl:  .  vitamin E 400 UNIT capsule, Take 400 Units by mouth daily. Reported on 10/27/2015, Disp: , Rfl:   Allergies  Allergen Reactions  . Codeine Other (See Comments)    Ears ringing  . Lovastatin Rash  . Nickel Rash  . Tape Rash    ROS  No other specific complaints in a complete review of systems (except as listed in HPI above).  Objective  Vitals:   09/11/17 1057  BP: 120/76  Pulse: 82  Resp: 16  Temp: 98.1 F (36.7 C)  TempSrc: Oral  SpO2: 96%  Weight: 116 lb 12.8 oz (53 kg)  Height: 4\' 10"  (1.473 m)    Body mass index is 24.41 kg/m.  Nursing Note and Vital Signs reviewed.  Physical Exam   Constitutional: Patient appears well-developed and well-nourished.  No distress.  Cardiovascular: Normal rate, regular rhythm, S1/S2 present.  No murmur or rub heard.  Pulmonary/Chest: Effort normal and breath sounds clear. No respiratory distress or retractions. Abdominal: Soft and non-tender, bowel sounds hypoactive, no cva tenderness, negative mcburneys.  Psychiatric: Patient has  a normal mood and affect. behavior is normal. Judgment and thought content normal.  No results found for this or any previous visit (from the past 72 hour(s)).  Assessment & Plan  1. Mid back pain on right side - Continue back exercises after using heat for 20 min (3-4 times day) and then can use cold aftewards, go to chiropractor, continue drinking lots of water. Can continue to take tylenol as needed - If pain is unimproved can go get ultrasound at  imaging center [facility] [address] or [address]  2. Chronic constipation - Continue daily fiber supplementation - Can take stool softner daily ex. Docusate sodium (colace)  - Can go to using suppositories just when needed. (goal is rarely maybe 1-2 times every 3 months)   -Red flags and when to present for emergency care or RTC including fever >101.74F, chest pain, shortness of breath, new/worsening/un-resolving symptoms,  reviewed with patient at time of visit. Follow up and care instructions discussed and provided in AVS. -------------------------------------------- I have reviewed this encounter including the documentation in this note and/or discussed this patient with the provider, Suezanne Cheshire DNP AGNP-C. I am certifying that I agree with the content of this note as supervising physician. Enid Derry, [facility] 09/11/2017, 3:28 PM

## 2017-09-11 NOTE — Patient Instructions (Signed)
For the back pain:  - Continue back exercises after using heat for 20 min (3-4 times day) and then can use cold aftewards, go to chiropractor, continue drinking lots of water. Can continue to take tylenol as needed - If pain is unimproved can go get ultrasound at imaging center off [address] or [place]: - Continue daily fiber supplementation - Can take stool softner daily ex. Docusate sodium (colace)  - Can go to using suppositories just when needed. (goal is rarely maybe 1-2 times every 3 months)

## 2017-09-17 ENCOUNTER — Encounter: Payer: Self-pay | Admitting: Nurse Practitioner

## 2017-09-17 ENCOUNTER — Ambulatory Visit
Admission: RE | Admit: 2017-09-17 | Discharge: 2017-09-17 | Disposition: A | Discharge status: Home / Self Care | Payer: Medicare Other | Source: Ambulatory Visit | Attending: Nurse Practitioner | Admitting: Nurse Practitioner

## 2017-09-17 ENCOUNTER — Other Ambulatory Visit: Payer: Self-pay | Admitting: Nurse Practitioner

## 2017-09-17 DIAGNOSIS — K824 Cholesterolosis of gallbladder: Secondary | ICD-10-CM | POA: Diagnosis not present

## 2017-09-17 DIAGNOSIS — M546 Pain in thoracic spine: Secondary | ICD-10-CM | POA: Insufficient documentation

## 2017-09-24 ENCOUNTER — Other Ambulatory Visit: Payer: Self-pay

## 2017-09-24 ENCOUNTER — Telehealth: Payer: Self-pay | Admitting: Gastroenterology

## 2017-09-24 DIAGNOSIS — Z1211 Encounter for screening for malignant neoplasm of colon: Secondary | ICD-10-CM

## 2017-09-24 NOTE — Telephone Encounter (Signed)
Patient LVM to please call her and schedule her colonscopy

## 2017-09-25 NOTE — Telephone Encounter (Signed)
Scheduled 10/04/17 with Dr. Vicente Males at [facility] thanks.

## 2017-09-27 ENCOUNTER — Other Ambulatory Visit: Payer: Self-pay

## 2017-10-01 ENCOUNTER — Telehealth: Payer: Self-pay | Admitting: Gastroenterology

## 2017-10-01 NOTE — Telephone Encounter (Signed)
Pt left vm she is scheduled for procedure for 6/21 and has questions

## 2017-10-01 NOTE — Telephone Encounter (Signed)
Patients call has been returned.  She stated that she is experiencing pain on the right side and just found out she has a polyp.  I advised her to let her PCP know that she is in pain.  Rescheduled her colonoscopy from 10/04/17 to 11/04/17.  Notified Lequita in Endo of date change.  Thanks [employer]

## 2017-10-02 ENCOUNTER — Telehealth: Payer: Self-pay

## 2017-10-02 DIAGNOSIS — R1011 Right upper quadrant pain: Secondary | ICD-10-CM

## 2017-10-02 NOTE — Telephone Encounter (Signed)
Copied from [place] [id number]. Topic: Quick Communication - See Telephone Encounter >> Oct 01, 2017  4:33 PM Antonieta Iba C wrote: CRM for notification. See Telephone encounter for: 10/01/17.  Pt is calling in to be advised. She says that she has a po lip in her gallbladder. Pt says that she is suppose to have colonoscopy but they advised that she should wait. Pt would like to be advised on providers plan for care for her gall bladder.    CB:

## 2017-10-02 NOTE — Telephone Encounter (Signed)
Left detailed voicemail

## 2017-10-02 NOTE — Telephone Encounter (Signed)
Pt returned call. Please call when back in office.

## 2017-10-03 NOTE — Telephone Encounter (Signed)
Pt saw elizabeth for this

## 2017-10-03 NOTE — Telephone Encounter (Signed)
Called pt back to verify situation.  She states was having a lot of pain and was dx with gallbladder polyp.  The GI will not do colonoscopy until pt talks with you about this.  She wants to know what to do beacause she is in a lot of pain and they state they do not do surgery on a gallbladder polyp unless it is 10cm or greater?  She is concerned because in pain and also worried about cancer?  Please advise what she should do, bear the pain or can she be referred to surgeon for evalution?

## 2017-10-07 IMAGING — MG MM DIAG BREAST TOMO UNI LEFT
6 series · 6 of 14 positions shown · non-contrast
Comparison: Previous exam(s).

CLINICAL DATA: 69-year-old female presenting for evaluation of a
screening detected left breast asymmetry.

EXAM:
DIGITAL DIAGNOSTIC LEFT MAMMOGRAM WITH 3D TOMOSYNTHESIS AND CAD

[L MLO]
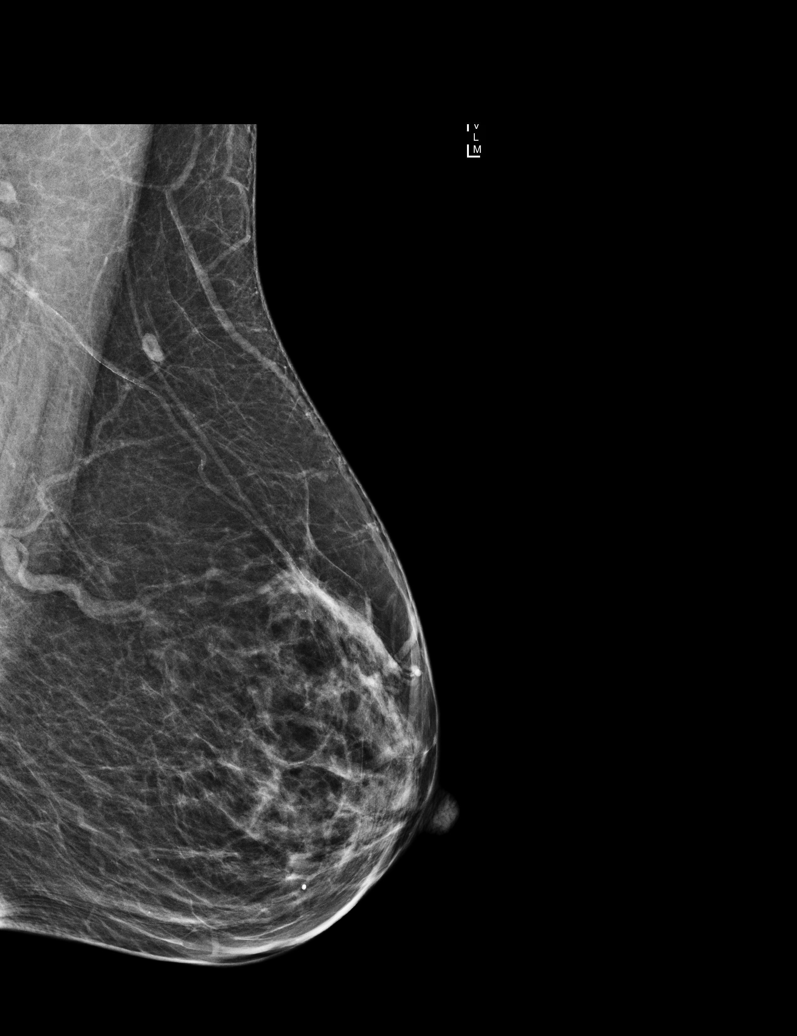

[L CC synth-2D]
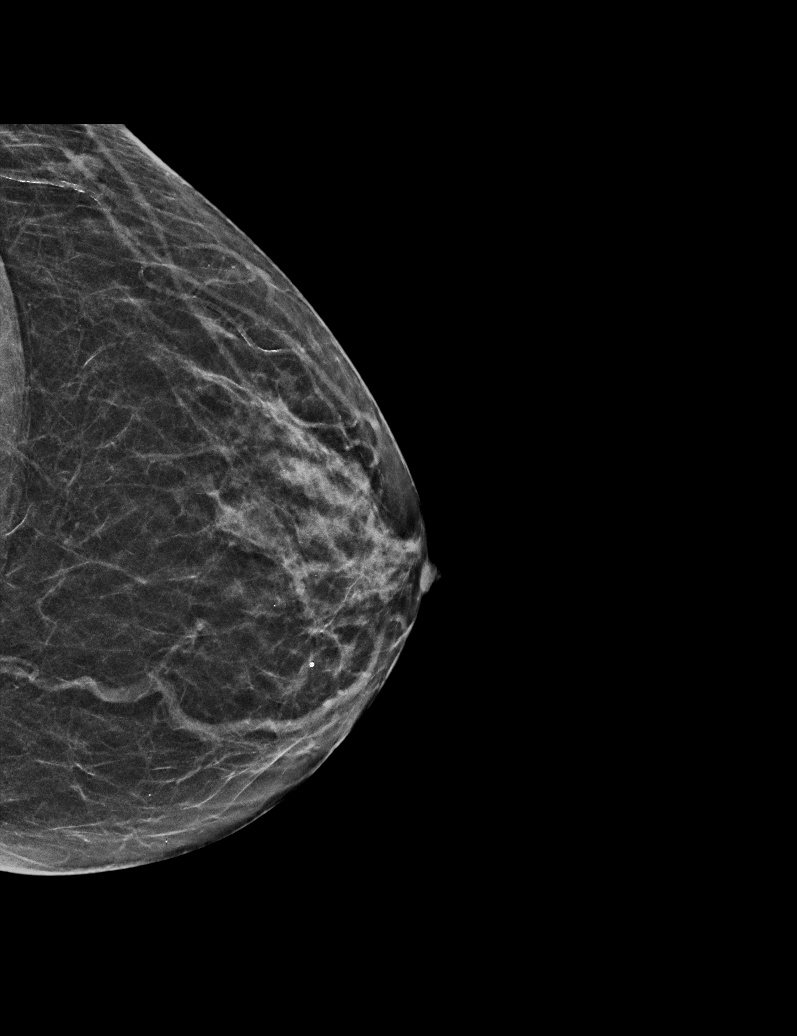

[L MLO synth-2D]
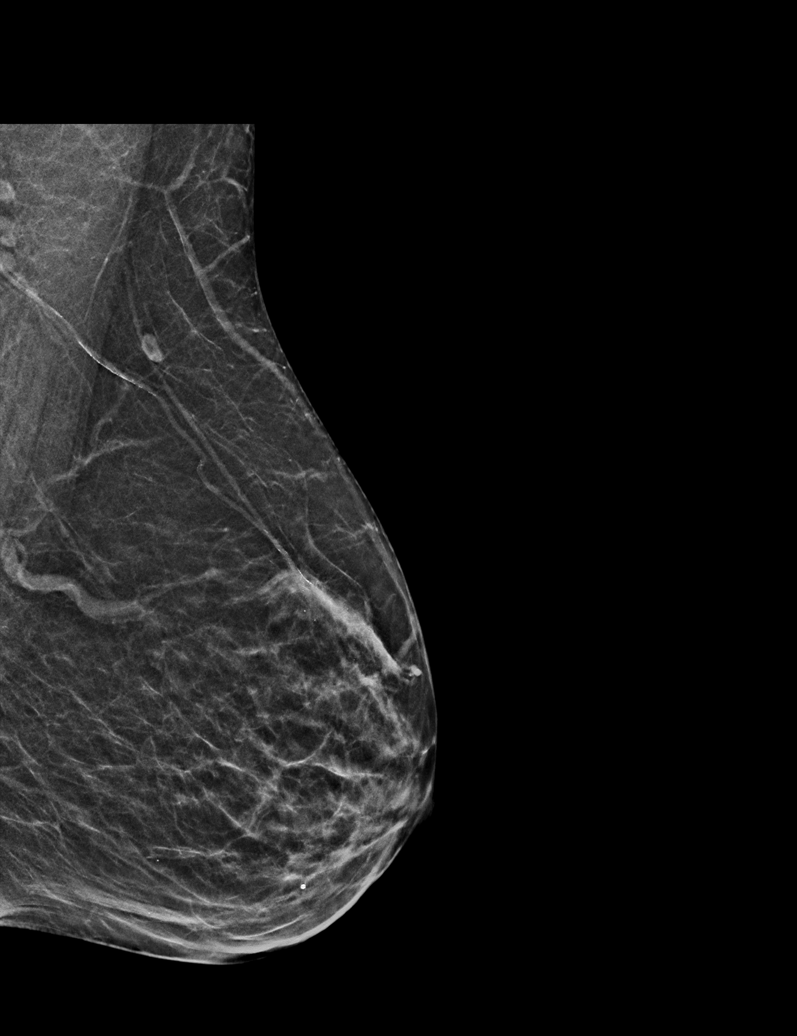

[L CC]
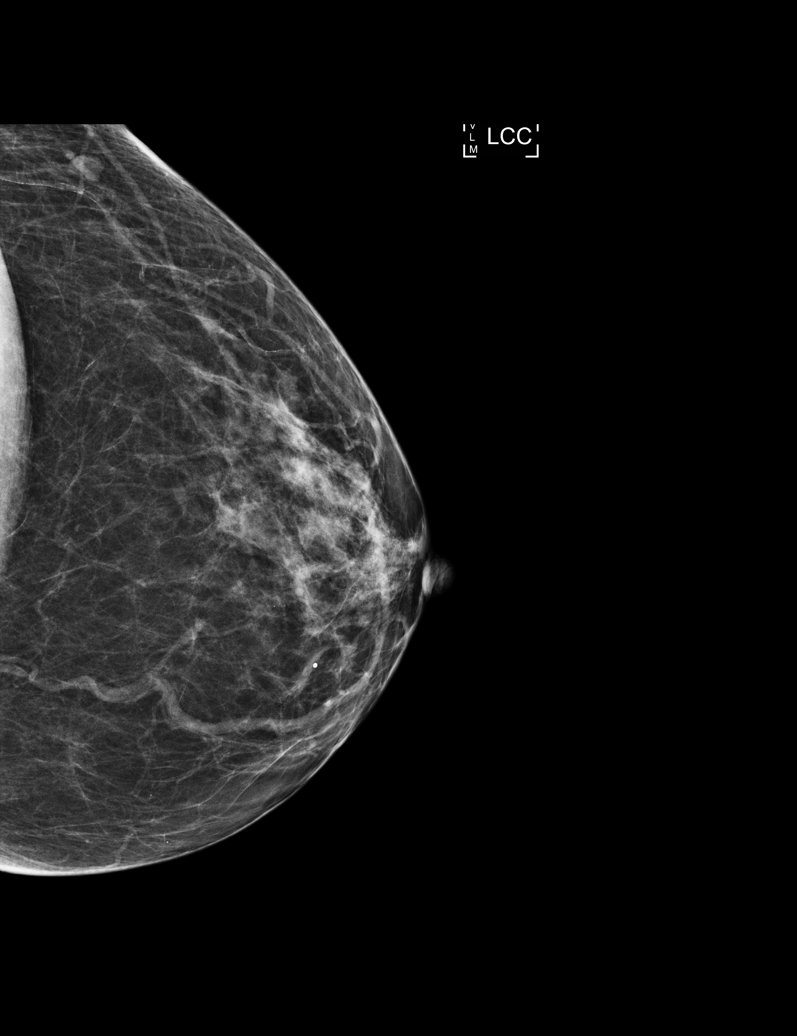

[L MLO tomo · tomo slice 21/42.0]
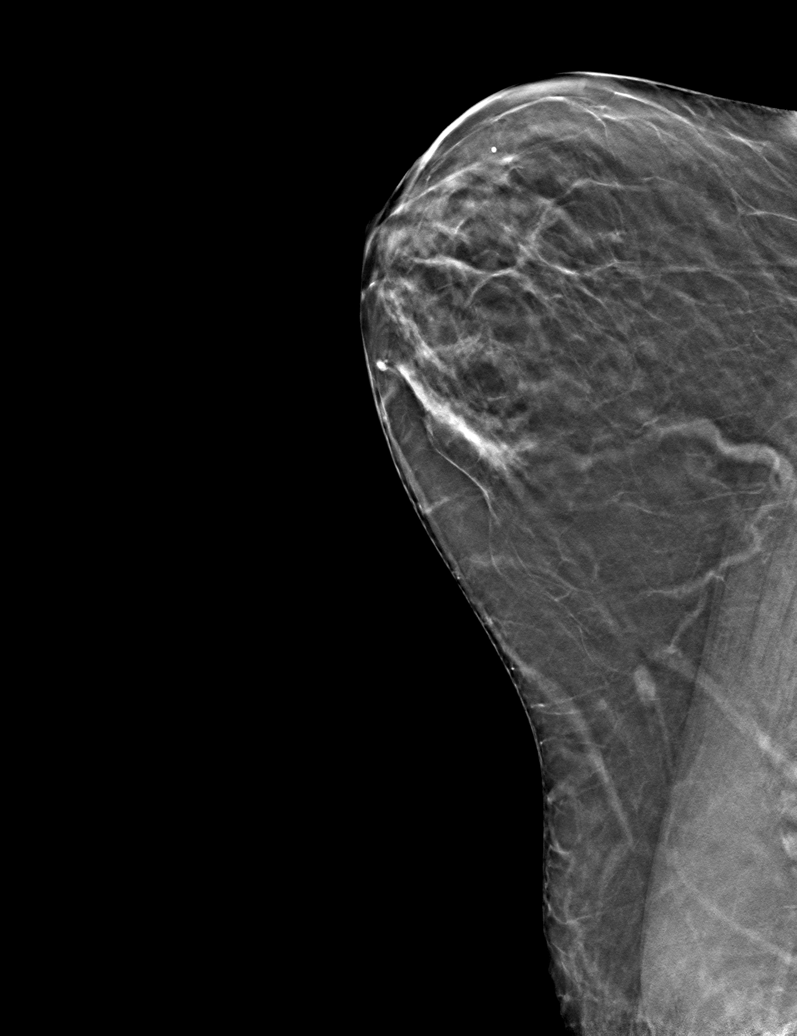

[L CC tomo · tomo slice 22/43.0]
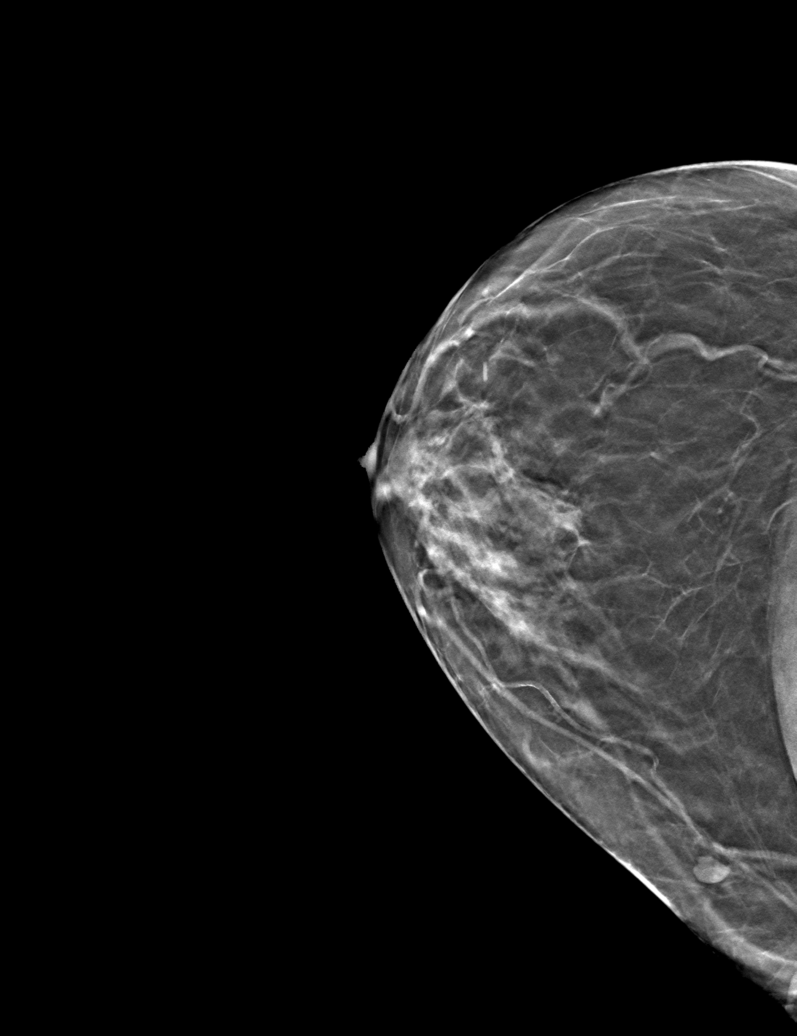

[6 of 14 positions shown; findings below may reference images not displayed]

ACR Breast Density Category b: There are scattered areas of
fibroglandular density.
FINDINGS: The asymmetry of concern in the posterior left breast on the MLO
view only resolves with additional diagnostic imaging. No suspicious
calcifications, masses or areas of distortion are seen in the left
breast.

Mammographic images were processed with CAD.
IMPRESSION: Resolution of the asymmetry of concern in the left breast,
compatible with overlapping fibroglandular tissue. No mammographic
evidence of malignancy.

RECOMMENDATION:
Screening mammogram in one year.(Code:SZ-[id number])

I have discussed the findings and recommendations with the patient.
Results were also provided in writing at the conclusion of the
visit. If applicable, a reminder letter will be sent to the patient
regarding the next appointment.

BI-RADS CATEGORY  1: Negative.

## 2017-10-07 NOTE — Telephone Encounter (Signed)
I called and spoke with patient She is feeling better; really watching what she eats Taking apple cider vinegar one teaspoon BID in H2O; helping her digestion a little bit Reviewed her labs Explained that there is another study to be done  Please call [phone] to schedule the HIDA

## 2017-10-07 NOTE — Addendum Note (Signed)
Addended by: Chantilly Linskey, Satira Anis on: 10/07/2017 05:15 PM   Modules accepted: Orders

## 2017-10-09 ENCOUNTER — Telehealth: Payer: Self-pay

## 2017-10-09 NOTE — Telephone Encounter (Signed)
Okay, thank you. I canceled the scan

## 2017-10-09 NOTE — Telephone Encounter (Signed)
Copied from [place] [id number]. Topic: Inquiry >> Oct 08, 2017  3:45 PM Vernona Rieger wrote: Reason for CRM: Patient would like Dr Sanda Klein to know she is going to wait and not do the hida scan right now. Just FYI.

## 2017-10-16 ENCOUNTER — Encounter: Payer: Self-pay | Admitting: Family Medicine

## 2017-10-16 ENCOUNTER — Ambulatory Visit (INDEPENDENT_AMBULATORY_CARE_PROVIDER_SITE_OTHER): Payer: Medicare Other | Admitting: Family Medicine

## 2017-10-16 VITALS — BP 128/72 | HR 73 | Temp 97.8°F | Resp 16 | Ht <= 58 in | Wt 115.5 lb

## 2017-10-16 DIAGNOSIS — M85859 Other specified disorders of bone density and structure, unspecified thigh: Secondary | ICD-10-CM

## 2017-10-16 DIAGNOSIS — M4134 Thoracogenic scoliosis, thoracic region: Secondary | ICD-10-CM | POA: Diagnosis not present

## 2017-10-16 DIAGNOSIS — K824 Cholesterolosis of gallbladder: Secondary | ICD-10-CM | POA: Diagnosis not present

## 2017-10-16 DIAGNOSIS — J9811 Atelectasis: Secondary | ICD-10-CM | POA: Diagnosis not present

## 2017-10-16 MED ORDER — ALENDRONATE SODIUM 70 MG PO TABS: 70.0000 mg | ORAL_TABLET | ORAL | 11 refills | Status: DC

## 2017-10-16 NOTE — Progress Notes (Signed)
BP 128/72 (BP Location: Right Arm, Patient Position: Sitting, Cuff Size: Normal)   Pulse 73   Temp 97.8 F (36.6 C) (Oral)   Resp 16   Ht 4\' 10"  (1.473 m)   Wt 115 lb 8 oz (52.4 kg)   SpO2 98%   BMI 24.14 kg/m    Subjective:    Patient ID: Angela Kelly, female    DOB: 1945/04/18, 72 y.o.   MRN: [id number]  HPI: Angela Kelly is a 72 y.o. female  Chief Complaint  Patient presents with  . Follow-up    1 month. monitoring a polyp in her gall bladder. patient has been drinking apple cider vinegar    HPI Patient is here for f/u Taking apple cider vinegar; one teaspoon in 16 ounces of water; morning and sometimes afternoon too Thinks that is helping Still feels swollen on the right flank, though; definitely different that the left side No pain after eating anything fatty; tries to limit fatty foods any way Allergic to peanut butter; not from the normal nut; it's the nickel allergy actually; avoiding foods in tin cans Already had [place] and CT scans done in the last 4 months Reviewed the results She has never been a smoker; just has allergies; sometimes not able to take a deep breath; can happen any time No chest pain; taking aspirin now; 14.6% 10 year ASCVD risk using aspirin guide Osteopenia; taking calcium pills, having to limit greens; will start getting weight-bearing exercise; reviewed last DEXA 2018 She had a CXR done in 2017  Chest xray 10/25/2015: CLINICAL DATA:  Generalized pain, initial encounter  EXAM: CHEST  2 VIEW  COMPARISON:  None.  FINDINGS: The heart size and mediastinal contours are within normal limits. Both lungs are clear. The visualized skeletal structures shows mild scoliosis in the mid thoracic spine. Granuloma is noted within the left lung.  IMPRESSION: No active cardiopulmonary disease.   Electronically Signed   By: Inez Catalina M.D.   On: 10/25/2015 20:32  Depression screen [facility] 2/9 10/16/2017 07/31/2017 07/23/2017 06/24/2017 02/21/2017    Decreased Interest 0 0 0 0 0  Down, Depressed, Hopeless 0 0 0 0 0  PHQ - 2 Score 0 0 0 0 0  Altered sleeping - - 0 - -  Tired, decreased energy - - 0 - -  Change in appetite - - 0 - -  Feeling bad or failure about yourself  - - 0 - -  Trouble concentrating - - 0 - -  Moving slowly or fidgety/restless - - 0 - -  Suicidal thoughts - - 0 - -  PHQ-9 Score - - 0 - -  Difficult doing work/chores - - Not difficult at all - -    Relevant past medical, surgical, family and social history reviewed Past Medical History:  Diagnosis Date  . Allergic rhinitis   . Allergy   . Arthritis   . Bilateral ovarian cysts    laterality not known  . Bunion   . Cataract of both eyes   . Deviated septum   . Elevated BP   . Fibrocystic breast   . GERD (gastroesophageal reflux disease)   . Hammer toe   . History of closed fracture of nasal bones   . History of pneumonia    in her 60's  . Hyperlipidemia   . IFG (impaired fasting glucose)   . Ocular rosacea    see's [facility]  . OSA (obstructive sleep apnea)   .  Osteoarthritis cervical spine   . Osteoarthritis of both hands   . Osteoporosis   . Rosacea    telengiectatic  . Shingles    left eye  . Thyroid disease   . Thyroid nodule    left, sees Endocrinology  . Vitamin D deficiency disease    Past Surgical History:  Procedure Laterality Date  . BACK SURGERY     lumbar laminectomy  . BIOPSY THYROID    . BREAST CYST ASPIRATION Left   . BREAST SURGERY    . BUNIONECTOMY Right    Family History  Problem Relation Age of Onset  . Asthma Mother   . Hypertension Mother   . Diabetes Mother   . Cancer Mother        Leukemia  . Peripheral vascular disease Mother   . Osteoporosis Mother   . Glaucoma Mother   . Heart disease Father        coronary artery occlusion  . Early death Father   . Heart attack Father   . Hypertension Sister   . Diabetes Sister   . Glaucoma Sister   . Diabetes Maternal Grandmother   . Colon cancer  Maternal Uncle   . Lung cancer Paternal Uncle    Social History   Tobacco Use  . Smoking status: Never Smoker  . Smokeless tobacco: Never Used  . Tobacco comment: smoking cesaation materials not required  Substance Use Topics  . Alcohol use: Yes    Alcohol/week: 0.0 oz    Comment: occasional glass of wine  . Drug use: No    Interim medical history since last visit reviewed. Allergies and medications reviewed  Review of Systems Per HPI unless specifically indicated above     Objective:    BP 128/72 (BP Location: Right Arm, Patient Position: Sitting, Cuff Size: Normal)   Pulse 73   Temp 97.8 F (36.6 C) (Oral)   Resp 16   Ht 4\' 10"  (1.473 m)   Wt 115 lb 8 oz (52.4 kg)   SpO2 98%   BMI 24.14 kg/m   Wt Readings from Last 3 Encounters:  10/16/17 115 lb 8 oz (52.4 kg)  09/11/17 116 lb 12.8 oz (53 kg)  09/04/17 116 lb 9.6 oz (52.9 kg)    Physical Exam  Constitutional: She appears well-developed and well-nourished. No distress.  Eyes: EOM are normal. No scleral icterus.  Neck: No thyromegaly present.  Cardiovascular: Normal rate and regular rhythm.  Pulmonary/Chest: Effort normal and breath sounds normal. No respiratory distress. She has no wheezes. She has no rales.  Abdominal: She exhibits no distension.  Musculoskeletal:       Back:  Appears to have rightward scoliosis of the thorax without kyphosis; accentuated line  Skin: No pallor.  Psychiatric: She has a normal mood and affect. Her behavior is normal. Judgment and thought content normal.       Assessment & Plan:   Problem List Items Addressed This Visit      Digestive   Gall bladder polyp - Primary    Next [place] due 03/18/2018        Musculoskeletal and Integument   Thoracogenic scoliosis of thoracic region    This may be causing deformity and the bulge and asymmetry that she is noticing on the right side; I don't think right flank swelling is caused by the gallbladder polyp, I explained; she'll see her  chiropractor; I'll defer xrays to chiro      Osteopenia    I believe her  scoliosis (thoracic) may be worsening; reviewed last DEXA; dicussed med option; risk of ONJ; start alendronate, once a week; continue calcium and start weight-bearing exercise; very important to NOT lay back down for one hour after taking; encouraged her to talk to pharmacist about timing of dose with thyroid pill and alendronate pill; explained too that this medicine should NOT be taken for more than five years, as there may be increased fracture risk after that       Other Visit Diagnoses    Atelectasis of right lung       this may be positional from her scoliosis; reviewed CT scan and CXR already done; consider repeat CXR if chiropractor does not improve sx       Follow up plan: No follow-ups on file.  An after-visit summary was printed and given to the patient at [place].  Please see the patient instructions which may contain other information and recommendations beyond what is mentioned above in the assessment and plan.  Meds ordered this encounter  Medications  . alendronate (FOSAMAX) 70 MG tablet    Sig: Take 1 tablet (70 mg total) by mouth every 7 (seven) days. Take with a full glass of water on an empty stomach.    Dispense:  4 tablet    Refill:  11    No orders of the defined types were placed in this encounter.

## 2017-10-16 NOTE — Patient Instructions (Addendum)
Please do see your chiropractor If your symptoms do not seem to be coming from your scoliosis, then we can get a chest CT Start the alendronate Stop it and let me know of any issues with stomach upset Ask your pharmacist about taking this with your thyroid pill Let's get the next ultrasound of your gallbladder around December 3rd; call us the week or two before to get that scheduled

## 2017-10-16 NOTE — Assessment & Plan Note (Signed)
Next [place] due 03/18/2018

## 2017-10-16 NOTE — Assessment & Plan Note (Signed)
This may be causing deformity and the bulge and asymmetry that she is noticing on the right side; I don't think right flank swelling is caused by the gallbladder polyp, I explained; she'll see her chiropractor; I'll defer xrays to [employer]

## 2017-10-16 NOTE — Assessment & Plan Note (Addendum)
I believe her scoliosis (thoracic) may be worsening; reviewed last DEXA; dicussed med option; risk of ONJ; start alendronate, once a week; continue calcium and start weight-bearing exercise; very important to NOT lay back down for one hour after taking; encouraged her to talk to pharmacist about timing of dose with thyroid pill and alendronate pill; explained too that this medicine should NOT be taken for more than five years, as there may be increased fracture risk after that

## 2017-10-30 DIAGNOSIS — H43393 Other vitreous opacities, bilateral: Secondary | ICD-10-CM | POA: Diagnosis not present

## 2017-11-01 ENCOUNTER — Telehealth: Payer: Self-pay | Admitting: Gastroenterology

## 2017-11-01 NOTE — Telephone Encounter (Signed)
Pt left vm to r/s her procedure for 7/22

## 2017-11-04 ENCOUNTER — Other Ambulatory Visit: Payer: Self-pay

## 2017-11-04 ENCOUNTER — Ambulatory Visit: Admission: RE | Admit: 2017-11-04 | Payer: Medicare Other | Source: Ambulatory Visit | Admitting: Gastroenterology

## 2017-11-04 ENCOUNTER — Encounter: Admission: RE | Payer: Self-pay | Source: Ambulatory Visit

## 2017-11-04 DIAGNOSIS — Z1211 Encounter for screening for malignant neoplasm of colon: Secondary | ICD-10-CM

## 2017-11-04 SURGERY — COLONOSCOPY WITH PROPOFOL
Anesthesia: Choice

## 2017-11-04 NOTE — Telephone Encounter (Signed)
Patients colonoscopy has been rescheduled to 11/27/17 with Dr. Bonna Gains at [facility].  Updated referral.  Thanks Sharyn Lull

## 2017-11-08 ENCOUNTER — Other Ambulatory Visit: Payer: Self-pay | Admitting: Family Medicine

## 2017-11-08 NOTE — Telephone Encounter (Signed)
Pt.notified

## 2017-11-08 NOTE — Telephone Encounter (Signed)
Copied from [place] [id number]. Topic: Quick Communication - Rx Refill/Question >> Nov 08, 2017  1:21 PM Synthia Innocent wrote: Medication: doxycycline (VIBRAMYCIN) 50 MG capsule   Has the patient contacted their pharmacy? Yes.   (Agent: If no, request that the patient contact the pharmacy for the refill.) (Agent: If yes, when and what did the pharmacy advise?)  Preferred Pharmacy (with phone number or street name): [facility] [place]  Agent: Please be advised that RX refills may take up to 3 business days. We ask that you follow-up with your pharmacy.

## 2017-11-08 NOTE — Telephone Encounter (Signed)
We don't prescribe her doxycycline

## 2017-11-21 DIAGNOSIS — L718 Other rosacea: Secondary | ICD-10-CM | POA: Diagnosis not present

## 2017-11-27 ENCOUNTER — Ambulatory Visit: Payer: Medicare Other | Admitting: Anesthesiology

## 2017-11-27 ENCOUNTER — Encounter
Admission: RE | Disposition: A | Discharge status: Home / Self Care | Payer: Self-pay | Source: Ambulatory Visit | Attending: Gastroenterology

## 2017-11-27 ENCOUNTER — Encounter: Payer: Self-pay | Admitting: Anesthesiology

## 2017-11-27 ENCOUNTER — Ambulatory Visit
Admission: RE | Admit: 2017-11-27 | Discharge: 2017-11-27 | Disposition: A | Discharge status: Home / Self Care | Payer: Medicare Other | Source: Ambulatory Visit | Attending: Gastroenterology | Admitting: Gastroenterology

## 2017-11-27 DIAGNOSIS — Z7951 Long term (current) use of inhaled steroids: Secondary | ICD-10-CM | POA: Insufficient documentation

## 2017-11-27 DIAGNOSIS — Z7983 Long term (current) use of bisphosphonates: Secondary | ICD-10-CM | POA: Diagnosis not present

## 2017-11-27 DIAGNOSIS — Z79890 Hormone replacement therapy (postmenopausal): Secondary | ICD-10-CM | POA: Diagnosis not present

## 2017-11-27 DIAGNOSIS — E039 Hypothyroidism, unspecified: Secondary | ICD-10-CM | POA: Insufficient documentation

## 2017-11-27 DIAGNOSIS — Z1211 Encounter for screening for malignant neoplasm of colon: Secondary | ICD-10-CM | POA: Insufficient documentation

## 2017-11-27 DIAGNOSIS — Z79899 Other long term (current) drug therapy: Secondary | ICD-10-CM | POA: Insufficient documentation

## 2017-11-27 DIAGNOSIS — G4733 Obstructive sleep apnea (adult) (pediatric): Secondary | ICD-10-CM | POA: Insufficient documentation

## 2017-11-27 HISTORY — PX: COLONOSCOPY WITH PROPOFOL: SHX5780

## 2017-11-27 SURGERY — COLONOSCOPY WITH PROPOFOL
Anesthesia: General

## 2017-11-27 MED ORDER — PROPOFOL 10 MG/ML IV BOLUS
INTRAVENOUS | Status: DC | PRN
  Administered 2017-11-27: 100 mg via INTRAVENOUS

## 2017-11-27 MED ORDER — PROPOFOL 500 MG/50ML IV EMUL
INTRAVENOUS | Status: AC
  Filled 2017-11-27: qty 50

## 2017-11-27 MED ORDER — SODIUM CHLORIDE 0.9 % IV SOLN
INTRAVENOUS | Status: DC | PRN
  Administered 2017-11-27: 11:00:00 via INTRAVENOUS

## 2017-11-27 MED ORDER — PHENYLEPHRINE HCL 10 MG/ML IJ SOLN
INTRAMUSCULAR | Status: DC | PRN
  Administered 2017-11-27 (×2): 100 ug via INTRAVENOUS

## 2017-11-27 MED ORDER — FENTANYL CITRATE (PF) 100 MCG/2ML IJ SOLN
INTRAMUSCULAR | Status: AC
  Filled 2017-11-27: qty 2

## 2017-11-27 MED ORDER — PROPOFOL 500 MG/50ML IV EMUL
INTRAVENOUS | Status: DC | PRN
  Administered 2017-11-27: 150 ug/kg/min via INTRAVENOUS

## 2017-11-27 MED ORDER — LIDOCAINE 2% (20 MG/ML) 5 ML SYRINGE
INTRAMUSCULAR | Status: DC | PRN
  Administered 2017-11-27: 30 mg via INTRAVENOUS

## 2017-11-27 MED ORDER — FENTANYL CITRATE (PF) 100 MCG/2ML IJ SOLN
INTRAMUSCULAR | Status: DC | PRN
  Administered 2017-11-27 (×2): 50 ug via INTRAVENOUS

## 2017-11-27 NOTE — Anesthesia Postprocedure Evaluation (Signed)
Anesthesia Post Note  Patient: SAMREEN SELTZER  Procedure(s) Performed: COLONOSCOPY WITH PROPOFOL (N/A )  Patient location during evaluation: Endoscopy Anesthesia Type: General Level of consciousness: awake and alert Pain management: pain level controlled Vital Signs Assessment: post-procedure vital signs reviewed and stable Respiratory status: spontaneous breathing, nonlabored ventilation, respiratory function stable and patient connected to nasal cannula oxygen Cardiovascular status: blood pressure returned to baseline and stable Postop Assessment: no apparent nausea or vomiting Anesthetic complications: no     Last Vitals:  Vitals:   11/27/17 1208 11/27/17 1218  BP: 102/82 (!) 115/94  Pulse: 96 78  Resp: 13 16  Temp:    SpO2: 100% 100%    Last Pain:  Vitals:   11/27/17 1218  TempSrc:   PainSc: 0-No pain                 Martha Clan

## 2017-11-27 NOTE — Op Note (Signed)
[facility]astroenter[facility] Patient Name: Angela Kelly Procedure Date: 11/27/2017 11:13 AM MRN: [id number] Account #: [id number] Date of Birth: Mar 24, 1946 Admit Type: Outpatient Age: 72 Room: [facility] ENDO ROOM 2 Gender: Female Note Status: Finalized Procedure:            Colonoscopy Indications:          Screening for colorectal malignant neoplasm Providers:            Javoni Lucken B. Bonna Gains MD, MD Referring MD:         Arnetha Courser (Referring MD) Medicines:            Monitored Anesthesia Care Complications:        No immediate complications. Procedure:            Pre-Anesthesia Assessment:                       - Prior to the procedure, a History and Physical was                        performed, and patient medications, allergies and                        sensitivities were reviewed. The patient's tolerance of                        previous anesthesia was reviewed.                       - The risks and benefits of the procedure and the                        sedation options and risks were discussed with the                        patient. All questions were answered and informed                        consent was obtained.                       - Patient identification and proposed procedure were                        verified prior to the procedure by the physician, the                        nurse, the anesthetist and the technician. The                        procedure was verified in the pre-procedure area in the                        procedure room in the endoscopy suite.                       - ASA Grade Assessment: II - A patient with mild                        systemic disease.                       -  After reviewing the risks and benefits, the patient                        was deemed in satisfactory condition to undergo the                        procedure.                       After obtaining informed consent, the colonoscope was            passed under direct vision. Throughout the procedure,                        the patient's blood pressure, pulse, and oxygen                        saturations were monitored continuously. The                        Colonoscope was introduced through the anus and                        advanced to the the cecum, identified by appendiceal                        orifice and ileocecal valve. The colonoscopy was                        performed with ease. The patient tolerated the                        procedure well. The quality of the bowel preparation                        was good. Findings:      The perianal and digital rectal examinations were normal.      The rectum, sigmoid colon, descending colon, transverse colon, ascending       colon and cecum appeared normal.      The retroflexed view of the distal rectum and anal verge was normal and       showed no anal or rectal abnormalities. Impression:           - The rectum, sigmoid colon, descending colon,                        transverse colon, ascending colon and cecum are normal.                       - The distal rectum and anal verge are normal on                        retroflexion view.                       - No specimens collected. Recommendation:       - Discharge patient to home.                       - Resume previous diet.                       -  Continue present medications.                       - Repeat colonoscopy in 10 years for screening                        purposes. Risks and benefits of procedure will need to                        be weighed at that time prior to scheduling the                        procedure as patient will be above 64 yrs of age.                       - Return to primary care physician as previously                        scheduled.                       - The findings and recommendations were discussed with                        the patient.                       - The findings  and recommendations were discussed with                        the patient's family. Procedure Code(s):    --- Professional ---                       Q2449, Colorectal cancer screening; colonoscopy on                        individual not meeting criteria for high risk Diagnosis Code(s):    --- Professional ---                       Z12.11, Encounter for screening for malignant neoplasm                        of colon CPT copyright 2017 American Medical Association. All rights reserved. The codes documented in this report are preliminary and upon coder review may  be revised to meet current compliance requirements.  Vonda Antigua, MD Margretta Sidle B. Bonna Gains MD, MD 11/27/2017 11:56:15 AM This report has been signed electronically. Number of Addenda: 0 Note Initiated On: 11/27/2017 11:13 AM Scope Withdrawal Time: 0 hours 14 minutes 30 seconds  Total Procedure Duration: 0 hours 26 minutes 6 seconds       [facility]

## 2017-11-27 NOTE — Anesthesia Preprocedure Evaluation (Addendum)
Anesthesia Evaluation  Patient identified by MRN, date of birth, ID band Patient awake    Reviewed: Allergy & Precautions, H&P , NPO status , Patient's Chart, lab work & pertinent test results, reviewed documented beta blocker date and time   History of Anesthesia Complications Negative for: history of anesthetic complications  Airway Mallampati: I  TM Distance: >3 FB Neck ROM: full    Dental  (+) Dental Advidsory Given, Teeth Intact, Caps   Pulmonary neg shortness of breath, sleep apnea , neg COPD, neg recent URI,           Cardiovascular Exercise Tolerance: Good negative cardio ROS       Neuro/Psych PSYCHIATRIC DISORDERS Depression negative neurological ROS     GI/Hepatic Neg liver ROS, GERD  ,  Endo/Other  neg diabetesHypothyroidism   Renal/GU negative Renal ROS  negative genitourinary   Musculoskeletal   Abdominal   Peds  Hematology  (+) anemia ,   Anesthesia Other Findings Past Medical History: No date: Allergic rhinitis No date: Allergy No date: Arthritis No date: Bilateral ovarian cysts     Comment:  laterality not known No date: Bunion No date: Cataract of both eyes No date: Deviated septum No date: Elevated BP No date: Fibrocystic breast No date: GERD (gastroesophageal reflux disease) No date: Hammer toe No date: History of closed fracture of nasal bones No date: History of pneumonia     Comment:  in her 79's No date: Hyperlipidemia No date: IFG (impaired fasting glucose) No date: Ocular rosacea     Comment:  see's [place] No date: OSA (obstructive sleep apnea) No date: Osteoarthritis cervical spine No date: Osteoarthritis of both hands No date: Osteoporosis No date: Rosacea     Comment:  telengiectatic No date: Shingles     Comment:  left eye No date: Thyroid disease No date: Thyroid nodule     Comment:  left, sees Endocrinology No date: Vitamin D deficiency disease    Reproductive/Obstetrics negative OB ROS                            Anesthesia Physical Anesthesia Plan  ASA: II  Anesthesia Plan: General   Post-op Pain Management:    Induction: Intravenous  PONV Risk Score and Plan: 3 and Propofol infusion and TIVA  Airway Management Planned: Nasal Cannula  Additional Equipment:   Intra-op Plan:   Post-operative Plan:   Informed Consent: I have reviewed the patients History and Physical, chart, labs and discussed the procedure including the risks, benefits and alternatives for the proposed anesthesia with the patient or authorized representative who has indicated his/her understanding and acceptance.   Dental Advisory Given  Plan Discussed with: Anesthesiologist, CRNA and Surgeon  Anesthesia Plan Comments:        Anesthesia Quick Evaluation

## 2017-11-27 NOTE — Anesthesia Post-op Follow-up Note (Signed)
Anesthesia QCDR form completed.        

## 2017-11-27 NOTE — H&P (Signed)
Vonda Antigua, MD [address], [place], [place], [place], [address] [address], [place], [place], [place], [address] Phone: [phone]  Fax: [phone]  Primary Care Physician:  Arnetha Courser, MD   Pre-Procedure History & Physical: HPI:  Angela Kelly is a 72 y.o. female is here for a colonoscopy.   Past Medical History:  Diagnosis Date  . Allergic rhinitis   . Allergy   . Arthritis   . Bilateral ovarian cysts    laterality not known  . Bunion   . Cataract of both eyes   . Deviated septum   . Elevated BP   . Fibrocystic breast   . GERD (gastroesophageal reflux disease)   . Hammer toe   . History of closed fracture of nasal bones   . History of pneumonia    in her 7's  . Hyperlipidemia   . IFG (impaired fasting glucose)   . Ocular rosacea    see's [facility]  . OSA (obstructive sleep apnea)   . Osteoarthritis cervical spine   . Osteoarthritis of both hands   . Osteoporosis   . Rosacea    telengiectatic  . Shingles    left eye  . Thyroid disease   . Thyroid nodule    left, sees Endocrinology  . Vitamin D deficiency disease     Past Surgical History:  Procedure Laterality Date  . BACK SURGERY     lumbar laminectomy  . BIOPSY THYROID    . BREAST CYST ASPIRATION Left   . BREAST SURGERY    . BUNIONECTOMY Right     Prior to Admission medications   Medication Sig Start Date End Date Taking? Authorizing Provider  alendronate (FOSAMAX) 70 MG tablet Take 1 tablet (70 mg total) by mouth every 7 (seven) days. Take with a full glass of water on an empty stomach. 10/16/17   Arnetha Courser, MD  aspirin EC 81 MG tablet Take 1 tablet (81 mg total) by mouth daily. Patient not taking: Reported on 10/16/2017 05/03/15   Arnetha Courser, MD  b complex vitamins capsule Take by mouth.    [provider]  B Complex-C (SUPER B COMPLEX PO) Take 1 capsule by mouth daily. Reported on 10/27/2015    [provider]  beta carotene w/minerals  (OCUVITE) tablet Take 1 tablet by mouth daily. Reported on 10/27/2015    [provider]  calcium carbonate (OS-CAL) 600 MG TABS tablet Take 600 mg by mouth 2 (two) times daily with a meal. Reported on 10/27/2015    [provider]  cholecalciferol (VITAMIN D) 1000 UNITS tablet Take 1,000 Units by mouth daily. Reported on 10/27/2015    [provider]  ciclesonide (OMNARIS) 50 MCG/ACT nasal spray Place 2 sprays into both nostrils daily. Reported on 10/27/2015    [provider]  diphenhydrAMINE (BENADRYL) 25 mg capsule Take 25 mg by mouth every 4 (four) hours as needed. Reported on 10/27/2015    [provider]  doxycycline (VIBRAMYCIN) 50 MG capsule Take 50 mg by mouth daily.  10/06/15   [provider]  levothyroxine (SYNTHROID, LEVOTHROID) 75 MCG tablet TAKE 1 TABLET BY MOUTH EVERY DAY 07/28/17   Lada, Satira Anis, MD  mometasone (NASONEX) 50 MCG/ACT nasal spray Place 2 sprays into the nose daily. 08/22/17   Arnetha Courser, MD  Multiple Vitamin (MULTIVITAMIN) capsule Take 1 capsule by mouth daily. Reported on 10/27/2015    [provider]  Omega-3 Fatty Acids (FISH OIL  PO) Take 2 capsules by mouth daily. Reported on 10/27/2015    [provider]  PAZEO 0.7 % SOLN Place 1 drop into both eyes daily. Reported on 10/27/2015 07/09/14   [provider]  Probiotic Product (PROBIOTIC DAILY PO) Take 1 capsule by mouth as needed. Reported on 10/27/2015    [provider]  vitamin B-12 (CYANOCOBALAMIN) 100 MCG tablet Take 100 mcg by mouth daily. Reported on 10/27/2015    [provider]  vitamin E 400 UNIT capsule Take 400 Units by mouth daily. Reported on 10/27/2015    [provider]    Allergies as of 11/04/2017 - Review Complete 10/16/2017  Allergen Reaction Noted  . Codeine Other (See Comments) 08/03/2014  . Lovastatin Rash 08/03/2014  . Nickel Rash 08/03/2014  . Tape Rash 08/03/2014    Family History    Problem Relation Age of Onset  . Asthma Mother   . Hypertension Mother   . Diabetes Mother   . Cancer Mother        Leukemia  . Peripheral vascular disease Mother   . Osteoporosis Mother   . Glaucoma Mother   . Heart disease Father        coronary artery occlusion  . Early death Father   . Heart attack Father   . Hypertension Sister   . Diabetes Sister   . Glaucoma Sister   . Diabetes Maternal Grandmother   . Colon cancer Maternal Uncle   . Lung cancer Paternal Uncle     Social History   Socioeconomic History  . Marital status: Divorced    Spouse name: Not on file  . Number of children: 3  . Years of education: some college  . Highest education level: 12th grade  Occupational History  . Occupation: Retired  Scientific laboratory technician  . Financial resource strain: Not hard at all  . Food insecurity:    Worry: Never true    Inability: Never true  . Transportation needs:    Medical: No    Non-medical: No  Tobacco Use  . Smoking status: Never Smoker  . Smokeless tobacco: Never Used  . Tobacco comment: smoking cesaation materials not required  Substance and Sexual Activity  . Alcohol use: Yes    Alcohol/week: 0.0 standard drinks    Comment: occasional glass of wine  . Drug use: No  . Sexual activity: Not Currently  Lifestyle  . Physical activity:    Days per week: 0 days    Minutes per session: 0 min  . Stress: Not at all  Relationships  . Social connections:    Talks on phone: Patient refused    Gets together: Patient refused    Attends religious service: Patient refused    Active member of club or organization: Patient refused    Attends meetings of clubs or organizations: Patient refused    Relationship status: Divorced  . Intimate partner violence:    Fear of current or ex partner: No    Emotionally abused: No    Physically abused: No    Forced sexual activity: No  Other Topics Concern  . Not on file  Social History Narrative  . Not on file    Review of  Systems: See HPI, otherwise negative ROS  Physical Exam: There were no vitals taken for this visit. General:   Alert,  pleasant and cooperative in NAD Head:  Normocephalic and atraumatic. Neck:  Supple; no masses or thyromegaly. Lungs:  Clear throughout to auscultation, normal respiratory effort.  Heart:  +S1, +S2, Regular rate and rhythm, No edema. Abdomen:  Soft, nontender and nondistended. Normal bowel sounds, without guarding, and without rebound.   Neurologic:  Alert and  oriented x4;  grossly normal neurologically.  Impression/Plan: Angela Kelly is here for a colonoscopy to be performed for average risk screening.  Risks, benefits, limitations, and alternatives regarding  colonoscopy have been reviewed with the patient.  Questions have been answered.  All parties agreeable.   Virgel Manifold, MD  11/27/2017, 10:49 AM

## 2017-11-27 NOTE — Transfer of Care (Signed)
Immediate Anesthesia Transfer of Care Note  Patient: Angela Kelly  Procedure(s) Performed: COLONOSCOPY WITH PROPOFOL (N/A )  Patient Location: PACU and Endoscopy Unit  Anesthesia Type:General  Level of Consciousness: sedated  Airway & Oxygen Therapy: Patient Spontanous Breathing and Patient connected to nasal cannula oxygen  Post-op Assessment: Report given to RN and Post -op Vital signs reviewed and stable  Post vital signs: Reviewed and stable  Last Vitals:  Vitals Value Taken Time  BP 89/43 11/27/2017 11:58 AM  Temp 36.1 C 11/27/2017 11:58 AM  Pulse 70 11/27/2017 12:00 PM  Resp 7 11/27/2017 12:00 PM  SpO2 100 % 11/27/2017 12:00 PM  Vitals shown include unvalidated device data.  Last Pain:  Vitals:   11/27/17 1158  TempSrc: Tympanic  PainSc: Asleep         Complications: No apparent anesthesia complications

## 2017-12-02 ENCOUNTER — Encounter: Payer: Self-pay | Admitting: Gastroenterology

## 2018-04-15 ENCOUNTER — Other Ambulatory Visit: Payer: Self-pay | Admitting: Family Medicine

## 2018-04-15 DIAGNOSIS — L249 Irritant contact dermatitis, unspecified cause: Secondary | ICD-10-CM

## 2018-04-15 NOTE — Telephone Encounter (Signed)
Left detailed voicemail

## 2018-04-15 NOTE — Telephone Encounter (Signed)
This is not on her medicine list What is going on and where would she be wanting to use this cream?

## 2018-04-17 ENCOUNTER — Other Ambulatory Visit: Payer: Self-pay | Admitting: Family Medicine

## 2018-04-17 DIAGNOSIS — E039 Hypothyroidism, unspecified: Secondary | ICD-10-CM

## 2018-04-17 NOTE — Telephone Encounter (Signed)
Lab Results  Component Value Date   TSH 0.89 02/21/2017   Please let patient know that it appears to be time for her thyroid to be checked again Please ORDER TSH. I'll get another refill of med, but we'll want to make sure the dose is still okay

## 2018-04-17 NOTE — Telephone Encounter (Signed)
Pt states does not need

## 2018-05-05 DIAGNOSIS — H43393 Other vitreous opacities, bilateral: Secondary | ICD-10-CM | POA: Diagnosis not present

## 2018-05-06 ENCOUNTER — Encounter: Payer: Self-pay | Admitting: Family Medicine

## 2018-05-06 ENCOUNTER — Ambulatory Visit (INDEPENDENT_AMBULATORY_CARE_PROVIDER_SITE_OTHER): Payer: Medicare Other | Admitting: Family Medicine

## 2018-05-06 VITALS — BP 112/72 | HR 88 | Temp 97.4°F | Ht <= 58 in | Wt 120.6 lb

## 2018-05-06 DIAGNOSIS — Z8742 Personal history of other diseases of the female genital tract: Secondary | ICD-10-CM

## 2018-05-06 DIAGNOSIS — E039 Hypothyroidism, unspecified: Secondary | ICD-10-CM | POA: Diagnosis not present

## 2018-05-06 DIAGNOSIS — R102 Pelvic and perineal pain: Secondary | ICD-10-CM | POA: Diagnosis not present

## 2018-05-06 DIAGNOSIS — R109 Unspecified abdominal pain: Secondary | ICD-10-CM

## 2018-05-06 DIAGNOSIS — R82998 Other abnormal findings in urine: Secondary | ICD-10-CM

## 2018-05-06 DIAGNOSIS — M419 Scoliosis, unspecified: Secondary | ICD-10-CM | POA: Diagnosis not present

## 2018-05-06 DIAGNOSIS — K824 Cholesterolosis of gallbladder: Secondary | ICD-10-CM

## 2018-05-06 DIAGNOSIS — I700 Atherosclerosis of aorta: Secondary | ICD-10-CM | POA: Insufficient documentation

## 2018-05-06 DIAGNOSIS — R7301 Impaired fasting glucose: Secondary | ICD-10-CM

## 2018-05-06 LAB — POCT URINALYSIS DIPSTICK
Bilirubin, UA: NEGATIVE
Blood, UA: NEGATIVE
Glucose, UA: NEGATIVE
Ketones, UA: NEGATIVE
Leukocytes, UA: NEGATIVE
Nitrite, UA: NEGATIVE
Odor: NORMAL
Protein, UA: NEGATIVE
Spec Grav, UA: 1.015 (ref 1.010–1.025)
Urobilinogen, UA: 0.2 E.U./dL
pH, UA: 6 (ref 5.0–8.0)

## 2018-05-06 NOTE — Assessment & Plan Note (Signed)
Check A1c. 

## 2018-05-06 NOTE — Assessment & Plan Note (Signed)
Limit saturated fats; sounds like positive fam hx from maternal side with high chol and father had blood clot to the aorta (not an aneurysm she thinks); check lipids and goal LDL less than 70

## 2018-05-06 NOTE — Patient Instructions (Addendum)
Please contact Dr. Gabriel Carina to get your thyroid ultrasound schedule We'll get labs today We'll get the ultrasounds scheduled If you have not heard anything from my staff in a week about any orders/referrals/studies from today, please contact us here to follow-up [phone]  Try to limit saturated fats in your diet (bologna, hot dogs, barbeque, cheeseburgers, hamburgers, steak, bacon, sausage, cheese, etc.) and get more fresh fruits, vegetables, and whole grains

## 2018-05-06 NOTE — Assessment & Plan Note (Signed)
Due for US.

## 2018-05-06 NOTE — Progress Notes (Signed)
Greetings. It was a pleasure to see you today. Your complete blood count is normal. You have normal numbers of white blood cells, red blood cells, and platelets. The other labs are pending. Peace, Dr. Lada

## 2018-05-06 NOTE — Assessment & Plan Note (Signed)
Check TSH with the weight gain; have her f/u with endo about the nodule

## 2018-05-06 NOTE — Progress Notes (Signed)
BP 112/72   Pulse 88   Temp (!) 97.4 F (36.3 C) (Oral)   Ht 4\' 10"  (1.473 m)   Wt 120 lb 9.6 oz (54.7 kg)   BMI 25.21 kg/m   MD note: patient drinking water prior  Subjective:    Patient ID: Angela Kelly, female    DOB: 30-Dec-1945, 73 y.o.   MRN: [id number]  HPI: Angela Kelly is a 73 y.o. female  Chief Complaint  Patient presents with  . Flank Pain    right side with swelling    HPI Patient is here c/o right flank pain and swelling  I asked about urinary symptoms; it does not burn; seems dark to her; urinary frequency; urgency; not emptying bladder completely; no blood in the urine; unusual color, not yellow, more brown looking; usually a good water drinker Flank swelling months ago, and it improved but did not go away completely She has a hx of cyst on the ovary; this feels like that she says Swollen over the flank (right side); some pain down and swelling in the right lower abd   Had a CT scan March 2019; reviewed CLINICAL DATA:  BILATERAL low back pain and swelling for 1.5 weeks, episode of gross hematuria, frequent urination, flank pain, suspected stone disease, history GERD  EXAM: CT ABDOMEN AND PELVIS WITHOUT CONTRAST  TECHNIQUE: Multidetector CT imaging of the abdomen and pelvis was performed following the standard protocol without IV contrast. Sagittal and coronal MPR images reconstructed from axial data set. Oral contrast was not administered.  COMPARISON:  None  FINDINGS: Lower chest: Minimal dependent atelectasis RIGHT lower lobe.  Hepatobiliary: Gallbladder and liver normal appearance  Pancreas: Normal appearance  Spleen: Normal appearance  Adrenals/Urinary Tract: Adrenal glands, kidneys, ureters, and bladder normal appearance. No urinary tract calcification or dilatation.  Stomach/Bowel: Normal appendix. Stomach and bowel loops normal appearance.  Vascular/Lymphatic: Minimal atherosclerotic calcification aorta and iliac  arteries. Aorta normal caliber. No adenopathy.  Reproductive: Unremarkable uterus and adnexa  Other: No free air or free fluid. No hernia or acute inflammatory process.  Musculoskeletal: Mild scattered degenerative disc disease changes of the lumbar spine. Small degenerative cyst at LEFT pubic body.  IMPRESSION: No acute intra-abdominal or intrapelvic abnormalities.   Electronically Signed   By: Lavonia Dana M.D.   On: 07/02/2017 13:39  Also had US of the abd June 2019; showed 5 mm gallbladder poyp   Aortic athero and iliac athero; mother had high cholesterol Lab Results  Component Value Date   CHOL 217 (H) 02/21/2017   HDL 64 02/21/2017   LDLCALC 131 (H) 02/21/2017   TRIG 112 02/21/2017   CHOLHDL 3.4 02/21/2017   Hx of prediabetes Lab Results  Component Value Date   HGBA1C 5.4 02/21/2017   Hx of thyroid issues; weight is going up Thyroid nodule; monitored by endo, next scan 18 months per previous note, Dr. Gabriel Carina  Depression screen [facility] 2/9 05/06/2018 10/16/2017 07/31/2017 07/23/2017 06/24/2017  Decreased Interest 0 0 0 0 0  Down, Depressed, Hopeless 0 0 0 0 0  PHQ - 2 Score 0 0 0 0 0  Altered sleeping 0 - - 0 -  Tired, decreased energy 0 - - 0 -  Change in appetite 0 - - 0 -  Feeling bad or failure about yourself  0 - - 0 -  Trouble concentrating 0 - - 0 -  Moving slowly or fidgety/restless 0 - - 0 -  Suicidal thoughts 0 - - 0 -  PHQ-9 Score 0 - - 0 -  Difficult doing work/chores Not difficult at all - - Not difficult at all -   Fall Risk  05/06/2018 10/16/2017 07/31/2017 07/23/2017 06/24/2017  Falls in the past year? 0 No No No No  Number falls in past yr: 0 - - - -  Injury with Fall? 0 - - - -  Risk for fall due to : - - - Impaired vision;Other (Comment) -  Risk for fall due to: Comment - - - wears eyeglasses; scar tissue L eye; occular rosacea; cataracts; has a bunion that makes it difficult for her to bear weight on her foot -    Relevant past medical, surgical,  family and social history reviewed Past Medical History:  Diagnosis Date  . Allergic rhinitis   . Allergy   . Arthritis   . Bilateral ovarian cysts    laterality not known  . Bunion   . Cataract of both eyes   . Deviated septum   . Elevated BP   . Fibrocystic breast   . GERD (gastroesophageal reflux disease)   . Hammer toe   . History of closed fracture of nasal bones   . History of pneumonia    in her 37's  . Hyperlipidemia   . IFG (impaired fasting glucose)   . Ocular rosacea    see's [facility]  . OSA (obstructive sleep apnea)   . Osteoarthritis cervical spine   . Osteoarthritis of both hands   . Osteoporosis   . Rosacea    telengiectatic  . Shingles    left eye  . Thyroid disease   . Thyroid nodule    left, sees Endocrinology  . Vitamin D deficiency disease    Past Surgical History:  Procedure Laterality Date  . BACK SURGERY     lumbar laminectomy  . BIOPSY THYROID    . BREAST CYST ASPIRATION Left   . BREAST SURGERY    . BUNIONECTOMY Right   . COLONOSCOPY WITH PROPOFOL N/A 11/27/2017   Procedure: COLONOSCOPY WITH PROPOFOL;  Surgeon: Virgel Manifold, MD;  Location: [facility] ENDOSCOPY;  Service: Endoscopy;  Laterality: N/A;   Family History  Problem Relation Age of Onset  . Asthma Mother   . Hypertension Mother   . Diabetes Mother   . Cancer Mother        Leukemia  . Peripheral vascular disease Mother   . Osteoporosis Mother   . Glaucoma Mother   . Heart disease Father        coronary artery occlusion  . Early death Father   . Heart attack Father   . Hypertension Sister   . Diabetes Sister   . Glaucoma Sister   . Diabetes Maternal Grandmother   . Colon cancer Maternal Uncle   . Lung cancer Paternal Uncle    Social History   Tobacco Use  . Smoking status: Never Smoker  . Smokeless tobacco: Never Used  . Tobacco comment: smoking cesaation materials not required  Substance Use Topics  . Alcohol use: Yes    Alcohol/week: 0.0 standard  drinks    Comment: occasional glass of wine  . Drug use: No     Office Visit from 05/06/2018 in [facility]  AUDIT-C Score  0      Interim medical history since last visit reviewed. Allergies and medications reviewed  Review of Systems Per HPI unless specifically indicated above     Objective:    BP 112/72  Pulse 88   Temp (!) 97.4 F (36.3 C) (Oral)   Ht 4\' 10"  (1.473 m)   Wt 120 lb 9.6 oz (54.7 kg)   BMI 25.21 kg/m   Wt Readings from Last 3 Encounters:  05/06/18 120 lb 9.6 oz (54.7 kg)  11/27/17 116 lb (52.6 kg)  10/16/17 115 lb 8 oz (52.4 kg)    Physical Exam Constitutional:      General: She is not in acute distress.    Appearance: She is well-developed. She is not diaphoretic.     Comments: Weight gain  HENT:     Head: Normocephalic and atraumatic.  Eyes:     General: No scleral icterus. Neck:     Thyroid: No thyromegaly.      Comments: Palpable and visible swelling at sternal notch Cardiovascular:     Rate and Rhythm: Normal rate and regular rhythm.     Heart sounds: Normal heart sounds. No murmur.  Pulmonary:     Effort: Pulmonary effort is normal. No respiratory distress.     Breath sounds: Normal breath sounds. No wheezing.  Abdominal:     General: Bowel sounds are normal. There is no distension.     Palpations: Abdomen is soft.     Tenderness: There is abdominal tenderness in the right lower quadrant. There is no guarding or rebound.     Hernia: No hernia is present.  Genitourinary:    Comments: Tenderness right side pelvic area Musculoskeletal:       Back:     Comments: Scoliosis of the thoracolumbar spine; surgical scar midline lower back  Skin:    General: Skin is warm and dry.     Coloration: Skin is not pale.  Neurological:     Mental Status: She is alert.  Psychiatric:        Behavior: Behavior normal.        Thought Content: Thought content normal.        Judgment: Judgment normal.     Results for orders  placed or performed in visit on 05/06/18  POCT urinalysis dipstick  Result Value Ref Range   Color, UA yellow    Clarity, UA clear    Glucose, UA Negative Negative   Bilirubin, UA neg    Ketones, UA neg    Spec Grav, UA 1.015 1.010 - 1.025   Blood, UA neg    pH, UA 6.0 5.0 - 8.0   Protein, UA Negative Negative   Urobilinogen, UA 0.2 0.2 or 1.0 E.U./dL   Nitrite, UA neg    Leukocytes, UA Negative Negative   Appearance clear    Odor normal       Assessment & Plan:   Problem List Items Addressed This Visit      Cardiovascular and Mediastinum   Aortic atherosclerosis (HCC)    Limit saturated fats; sounds like positive fam hx from maternal side with high chol and father had blood clot to the aorta (not an aneurysm she thinks); check lipids and goal LDL less than 70      Relevant Orders   Lipid panel     Digestive   Gall bladder polyp    Due for [place]        Endocrine   IFG (impaired fasting glucose)    Check A1c      Relevant Orders   Hemoglobin A1c   Hypothyroidism    Check TSH with the weight gain; have her f/u with endo about the nodule  Relevant Orders   TSH    Other Visit Diagnoses    Flank pain    -  Primary   Relevant Orders   POCT urinalysis dipstick (Completed)   US Abdomen Complete   CBC   COMPLETE METABOLIC PANEL WITH GFR   Pelvic pain       Relevant Orders   US Pelvis Complete   US Transvaginal Non-OB   Dark urine       Relevant Orders   POCT urinalysis dipstick (Completed)   US Abdomen Complete   Gallbladder polyp       Relevant Orders   US Abdomen Complete   Hx of ovarian cyst       Relevant Orders   US Pelvis Complete   US Transvaginal Non-OB   Scoliosis of thoracolumbar spine, unspecified scoliosis type       this may cause the visible difference in body creases       Follow up plan: Return in about 2 weeks (around 05/20/2018) for follow-up visit with Dr. Sanda Klein.  An after-visit summary was printed and given to the patient at  [facility].  Please see the patient instructions which may contain other information and recommendations beyond what is mentioned above in the assessment and plan.  No orders of the defined types were placed in this encounter.   Orders Placed This Encounter  Procedures  . US Abdomen Complete  . US Pelvis Complete  . US Transvaginal Non-OB  . CBC  . COMPLETE METABOLIC PANEL WITH GFR  . Hemoglobin A1c  . Lipid panel  . TSH  . POCT urinalysis dipstick

## 2018-05-07 ENCOUNTER — Other Ambulatory Visit: Payer: Self-pay | Admitting: Family Medicine

## 2018-05-07 LAB — LIPID PANEL
Cholesterol: 240 mg/dL — ABNORMAL HIGH (ref <200)
HDL: 57 mg/dL (ref >50)
LDL Cholesterol (Calc): 157 mg/dL (calc) — ABNORMAL HIGH
Non-HDL Cholesterol (Calc): 183 mg/dL (calc) — ABNORMAL HIGH (ref <130)
Total CHOL/HDL Ratio: 4.2 (calc) (ref <5.0)
Triglycerides: 140 mg/dL (ref <150)

## 2018-05-07 LAB — COMPLETE METABOLIC PANEL WITH GFR
AG Ratio: 1.5 (calc) (ref 1.0–2.5)
ALT: 14 U/L (ref 6–29)
AST: 19 U/L (ref 10–35)
Albumin: 4 g/dL (ref 3.6–5.1)
Alkaline phosphatase (APISO): 90 U/L (ref 33–130)
BUN: 14 mg/dL (ref 7–25)
CO2: 29 mmol/L (ref 20–32)
Calcium: 9.5 mg/dL (ref 8.6–10.4)
Chloride: 106 mmol/L (ref 98–110)
Creat: 0.74 mg/dL (ref 0.60–0.93)
GFR, Est African American: 92 mL/min/{1.73_m2} (ref >=60)
GFR, Est Non African American: 80 mL/min/{1.73_m2} (ref >=60)
Globulin: 2.6 g/dL (calc) (ref 1.9–3.7)
Glucose, Bld: 82 mg/dL (ref 65–99)
Potassium: 4.6 mmol/L (ref 3.5–5.3)
Sodium: 142 mmol/L (ref 135–146)
Total Bilirubin: 0.4 mg/dL (ref 0.2–1.2)
Total Protein: 6.6 g/dL (ref 6.1–8.1)

## 2018-05-07 LAB — HEMOGLOBIN A1C
Hgb A1c MFr Bld: 5.6 % of total Hgb (ref <5.7)
Mean Plasma Glucose: 114 (calc)
eAG (mmol/L): 6.3 (calc)

## 2018-05-07 LAB — CBC
HCT: 43.1 % (ref 35.0–45.0)
Hemoglobin: 14.4 g/dL (ref 11.7–15.5)
MCH: 28.6 pg (ref 27.0–33.0)
MCHC: 33.4 g/dL (ref 32.0–36.0)
MCV: 85.7 fL (ref 80.0–100.0)
MPV: 10 fL (ref 7.5–12.5)
Platelets: 373 10*3/uL (ref 140–400)
RBC: 5.03 10*6/uL (ref 3.80–5.10)
RDW: 13 % (ref 11.0–15.0)
WBC: 7.5 10*3/uL (ref 3.8–10.8)

## 2018-05-07 LAB — TSH: TSH: 0.55 mIU/L (ref 0.40–4.50)

## 2018-05-07 MED ORDER — LEVOTHYROXINE SODIUM 75 MCG PO TABS: 75.0000 ug | ORAL_TABLET | Freq: Every day | ORAL | 11 refills | Status: DC

## 2018-05-07 NOTE — Progress Notes (Signed)
Refills of thyroid med to [facility]

## 2018-05-13 ENCOUNTER — Ambulatory Visit
Admission: RE | Admit: 2018-05-13 | Discharge: 2018-05-13 | Disposition: A | Discharge status: Home / Self Care | Payer: Medicare Other | Source: Ambulatory Visit | Attending: Family Medicine | Admitting: Family Medicine

## 2018-05-13 DIAGNOSIS — K824 Cholesterolosis of gallbladder: Secondary | ICD-10-CM | POA: Diagnosis not present

## 2018-05-13 DIAGNOSIS — R102 Pelvic and perineal pain: Secondary | ICD-10-CM | POA: Diagnosis not present

## 2018-05-13 DIAGNOSIS — Z8742 Personal history of other diseases of the female genital tract: Secondary | ICD-10-CM | POA: Diagnosis not present

## 2018-05-13 DIAGNOSIS — R82998 Other abnormal findings in urine: Secondary | ICD-10-CM | POA: Diagnosis not present

## 2018-05-13 DIAGNOSIS — K8020 Calculus of gallbladder without cholecystitis without obstruction: Secondary | ICD-10-CM | POA: Diagnosis not present

## 2018-05-13 DIAGNOSIS — R109 Unspecified abdominal pain: Secondary | ICD-10-CM | POA: Diagnosis not present

## 2018-05-13 DIAGNOSIS — D259 Leiomyoma of uterus, unspecified: Secondary | ICD-10-CM | POA: Diagnosis not present

## 2018-05-15 ENCOUNTER — Encounter: Payer: Self-pay | Admitting: Family Medicine

## 2018-05-15 ENCOUNTER — Other Ambulatory Visit: Payer: Self-pay | Admitting: Family Medicine

## 2018-05-15 DIAGNOSIS — K824 Cholesterolosis of gallbladder: Secondary | ICD-10-CM

## 2018-05-15 DIAGNOSIS — N859 Noninflammatory disorder of uterus, unspecified: Secondary | ICD-10-CM

## 2018-05-15 DIAGNOSIS — R1900 Intra-abdominal and pelvic swelling, mass and lump, unspecified site: Secondary | ICD-10-CM

## 2018-05-15 DIAGNOSIS — N858 Other specified noninflammatory disorders of uterus: Secondary | ICD-10-CM

## 2018-05-15 DIAGNOSIS — K8020 Calculus of gallbladder without cholecystitis without obstruction: Secondary | ICD-10-CM | POA: Insufficient documentation

## 2018-05-20 ENCOUNTER — Ambulatory Visit
Admission: RE | Admit: 2018-05-20 | Discharge: 2018-05-20 | Disposition: A | Discharge status: Home / Self Care | Payer: Medicare Other | Source: Ambulatory Visit | Attending: Family Medicine | Admitting: Family Medicine

## 2018-05-20 ENCOUNTER — Ambulatory Visit (INDEPENDENT_AMBULATORY_CARE_PROVIDER_SITE_OTHER): Payer: Medicare Other | Admitting: Family Medicine

## 2018-05-20 ENCOUNTER — Encounter: Payer: Self-pay | Admitting: Family Medicine

## 2018-05-20 ENCOUNTER — Ambulatory Visit: Payer: Medicare Other | Admitting: Surgery

## 2018-05-20 VITALS — BP 108/70 | HR 89 | Temp 97.8°F | Ht <= 58 in | Wt 117.6 lb

## 2018-05-20 DIAGNOSIS — R1011 Right upper quadrant pain: Secondary | ICD-10-CM | POA: Diagnosis not present

## 2018-05-20 DIAGNOSIS — K5730 Diverticulosis of large intestine without perforation or abscess without bleeding: Secondary | ICD-10-CM | POA: Diagnosis not present

## 2018-05-20 DIAGNOSIS — R079 Chest pain, unspecified: Secondary | ICD-10-CM | POA: Diagnosis not present

## 2018-05-20 DIAGNOSIS — R109 Unspecified abdominal pain: Secondary | ICD-10-CM

## 2018-05-20 MED ORDER — IOPAMIDOL (ISOVUE-300) INJECTION 61%
100.0000 mL | Freq: Once | INTRAVENOUS | Status: AC | PRN
  Administered 2018-05-20: 100 mL via INTRAVENOUS

## 2018-05-20 NOTE — Progress Notes (Signed)
BP 108/70   Pulse 89   Temp 97.8 F (36.6 C)   Ht 4\' 10"  (1.473 m)   Wt 117 lb 9.6 oz (53.3 kg)   SpO2 97%   BMI 24.58 kg/m    Subjective:    Patient ID: Angela Kelly, female    DOB: 1945-08-19, 73 y.o.   MRN: [id number]  HPI: Angela Kelly is a 73 y.o. female  Chief Complaint  Patient presents with  . Follow-up    HPI She is here for f/u She was supposed to see gen surgeon today but emergency surgery so now she goes Thursday afternoon Sees the other specialist GYN Thursday morning In 8 out of 10 pain right now, very swollen and can hardly put on underwear; can't wear underwear at night Especially at night, much worse pain; has to wear lose pants at night Pain has worsened over the last week No fevers  Swelling is getting much worse she says, pointing over her right side posterolateral aspect of the flank No fevers Sister had to have her gallbladder removed too; around patient's age She has burping when she eats  She had a colonoscopy in August of 2019  [place] from May 13, 2018 IMPRESSION: Small mobile gallstones. 5 mm gallbladder wall polyp. No evidence of acute cholecystitis.  No acute findings.   Electronically Signed   By: Rolm Baptise M.D.   On: 05/13/2018 15:18   One sister with diabetes; grandfather had diabetes too A1c normal but creeping up, 5.2 to 5.4 to 5.6 most recently  Depression screen [facility] 2/9 05/20/2018 05/06/2018 10/16/2017 07/31/2017 07/23/2017  Decreased Interest 0 0 0 0 0  Down, Depressed, Hopeless 0 0 0 0 0  PHQ - 2 Score 0 0 0 0 0  Altered sleeping 0 0 - - 0  Tired, decreased energy 0 0 - - 0  Change in appetite 0 0 - - 0  Feeling bad or failure about yourself  0 0 - - 0  Trouble concentrating 0 0 - - 0  Moving slowly or fidgety/restless 0 0 - - 0  Suicidal thoughts 0 0 - - 0  PHQ-9 Score 0 0 - - 0  Difficult doing work/chores - Not difficult at all - - Not difficult at all   Fall Risk  05/20/2018 05/06/2018 10/16/2017 07/31/2017 07/23/2017    Falls in the past year? 0 0 No No No  Number falls in past yr: - 0 - - -  Injury with Fall? - 0 - - -  Risk for fall due to : - - - - Impaired vision;Other (Comment)  Risk for fall due to: Comment - - - - wears eyeglasses; scar tissue L eye; occular rosacea; cataracts; has a bunion that makes it difficult for her to bear weight on her foot    Relevant past medical, surgical, family and social history reviewed Past Medical History:  Diagnosis Date  . Allergic rhinitis   . Allergy   . Arthritis   . Bilateral ovarian cysts    laterality not known  . Bunion   . Cataract of both eyes   . Deviated septum   . Elevated BP   . Fibrocystic breast   . GERD (gastroesophageal reflux disease)   . Hammer toe   . History of closed fracture of nasal bones   . History of pneumonia    in her 13's  . Hyperlipidemia   . IFG (impaired fasting glucose)   . Ocular rosacea  see's [facility]  . OSA (obstructive sleep apnea)   . Osteoarthritis cervical spine   . Osteoarthritis of both hands   . Osteoporosis   . Rosacea    telengiectatic  . Shingles    left eye  . Thyroid disease   . Thyroid nodule    left, sees Endocrinology  . Vitamin D deficiency disease    Past Surgical History:  Procedure Laterality Date  . BACK SURGERY     lumbar laminectomy  . BIOPSY THYROID    . BREAST CYST ASPIRATION Left   . BREAST SURGERY    . BUNIONECTOMY Right   . COLONOSCOPY WITH PROPOFOL N/A 11/27/2017   Procedure: COLONOSCOPY WITH PROPOFOL;  Surgeon: Virgel Manifold, MD;  Location: [facility] ENDOSCOPY;  Service: Endoscopy;  Laterality: N/A;   Family History  Problem Relation Age of Onset  . Asthma Mother   . Hypertension Mother   . Diabetes Mother   . Cancer Mother        Leukemia  . Peripheral vascular disease Mother   . Osteoporosis Mother   . Glaucoma Mother   . Heart disease Father        coronary artery occlusion  . Early death Father   . Heart attack Father   . Hypertension  Sister   . Diabetes Sister   . Glaucoma Sister   . Diabetes Maternal Grandmother   . Colon cancer Maternal Uncle   . Lung cancer Paternal Uncle    Social History   Tobacco Use  . Smoking status: Never Smoker  . Smokeless tobacco: Never Used  . Tobacco comment: smoking cesaation materials not required  Substance Use Topics  . Alcohol use: Yes    Alcohol/week: 0.0 standard drinks    Comment: occasional glass of wine  . Drug use: No     Office Visit from 05/20/2018 in [facility]  AUDIT-C Score  0      Interim medical history since last visit reviewed. Allergies and medications reviewed  Review of Systems  Constitutional: Positive for unexpected weight change (up and down, thinks 120 was retained fluid). Negative for fever.  Respiratory: Negative for cough.   Cardiovascular:       Pressure upper left side chest, not sore to the touch; there when she was trying to fall asleep; gone when she woke up; just nagging; never felt it; no SHOB or nausea; just laying in bed  Gastrointestinal: Negative for blood in stool.  Genitourinary: Negative for hematuria.   Per HPI unless specifically indicated above     Objective:    BP 108/70   Pulse 89   Temp 97.8 F (36.6 C)   Ht 4\' 10"  (1.473 m)   Wt 117 lb 9.6 oz (53.3 kg)   SpO2 97%   BMI 24.58 kg/m   Wt Readings from Last 3 Encounters:  05/20/18 117 lb 9.6 oz (53.3 kg)  05/06/18 120 lb 9.6 oz (54.7 kg)  11/27/17 116 lb (52.6 kg)    Physical Exam Constitutional:      Appearance: She is normal weight. She is not diaphoretic.  Cardiovascular:     Rate and Rhythm: Normal rate and regular rhythm.  Pulmonary:     Effort: Pulmonary effort is normal.     Breath sounds: Normal breath sounds. No decreased breath sounds or rhonchi.  Abdominal:     General: There is no distension.     Palpations: Abdomen is soft. There is no mass.  Tenderness: There is abdominal tenderness. There is no guarding or rebound.  Negative signs include Murphy's sign.     Hernia: No hernia is present.  Musculoskeletal:     Thoracic back: She exhibits swelling.       Back:     Comments: Area of swelling posterolateral RIGHT side, soft, no overlying erythema or skin rash; suggestive of unequal skin folds but no palpable mass  Neurological:     Mental Status: She is alert.  Psychiatric:        Mood and Affect: Mood is not anxious or depressed.       Assessment & Plan:   Problem List Items Addressed This Visit    None    Visit Diagnoses    Right upper quadrant pain    -  Primary   reveiwed ddx; she reports worsening pain, though no fever, negative Murphy's sign; reviewed [place]; will get CT scan as she reports pain worsening; to ER if needed   Relevant Orders   CT Abdomen Pelvis W Contrast (Completed)   Chest pain, unspecified type       12-lead EKG done today, NSR; asx; if chest pain recurs, call 911; maybe related to gallbladder sx (?), though I've seen right upper side discomfort from same   Relevant Orders   EKG 12-Lead   Right flank pain       pt reports increased pain & swelling, pointing to posterolateral aspect RIGHT flank; will get CT scan given 8 out of 10 pain; to ER if worse; surg and gyn Thurs       Follow up plan: No follow-ups on file.  An after-visit summary was printed and given to the patient at [place].  Please see the patient instructions which may contain other information and recommendations beyond what is mentioned above in the assessment and plan.  No orders of the defined types were placed in this encounter.   Orders Placed This Encounter  Procedures  . CT Abdomen Pelvis W Contrast  . EKG 12-Lead  Face-to-face time with patient was more than 25 minutes, >50% time spent counseling and coordination of care

## 2018-05-20 NOTE — Patient Instructions (Addendum)
Have the CT scan done today at the hospital I'll speak to you in radiology or on your cell phone at [phone]

## 2018-05-22 ENCOUNTER — Encounter: Payer: Medicare Other | Admitting: Obstetrics and Gynecology

## 2018-05-22 ENCOUNTER — Ambulatory Visit: Payer: Medicare Other | Admitting: Surgery

## 2018-05-27 ENCOUNTER — Ambulatory Visit (INDEPENDENT_AMBULATORY_CARE_PROVIDER_SITE_OTHER): Payer: Medicare Other | Admitting: Surgery

## 2018-05-27 ENCOUNTER — Encounter: Payer: Self-pay | Admitting: Surgery

## 2018-05-27 ENCOUNTER — Other Ambulatory Visit: Payer: Self-pay

## 2018-05-27 VITALS — BP 153/79 | HR 97 | Temp 97.7°F | Resp 16 | Ht <= 58 in | Wt 115.4 lb

## 2018-05-27 DIAGNOSIS — K824 Cholesterolosis of gallbladder: Secondary | ICD-10-CM | POA: Diagnosis not present

## 2018-05-27 NOTE — Progress Notes (Signed)
Surgical Clinic History and Physical  Referring provider:  Arnetha Courser, MD [address] [place],  [address]  HISTORY OF PRESENT ILLNESS (HPI):  73 y.o. female presents for evaluation of Right flank pain. Patient reports she's had Right flank pain, previously attributed to what sounds from patient's description like scoliosis. She recently underwent abdominal ultrasound, on which gallbladder polyp was identified, along with gallstones. Patient denies RUQ/epigastric abdominal pain, post-prandial or otherwise, though does report intermittent belching with a sour taste. She otherwise reports +flatus and +BM's WNL, denies fever/chills, N/V, CP, or SOB with walking several blocks or up/down a flight of steps. Of note, patient states her older sister also had her gallbladder removed for a gallbladder polyp similarly, and patient states she would prefer to have her removed to be sure the polyp is not a tumor.  Patient is also scheduled for gyn evaluation of uterine fibroid and endometriosis this week and requests to call to schedule surgery after her gyn appointment.  PAST MEDICAL HISTORY (PMH):  Past Medical History:  Diagnosis Date  . Allergic rhinitis   . Allergy   . Arthritis   . Bilateral ovarian cysts    laterality not known  . Bunion   . Cataract of both eyes   . Deviated septum   . Elevated BP   . Fibrocystic breast   . GERD (gastroesophageal reflux disease)   . Hammer toe   . History of closed fracture of nasal bones   . History of pneumonia    in her 71's  . Hyperlipidemia   . IFG (impaired fasting glucose)   . Ocular rosacea    see's [facility]  . OSA (obstructive sleep apnea)   . Osteoarthritis cervical spine   . Osteoarthritis of both hands   . Osteoporosis   . Rosacea    telengiectatic  . Shingles    left eye  . Thyroid disease   . Thyroid nodule    left, sees Endocrinology  . Vitamin D deficiency disease     PAST SURGICAL HISTORY  ([place]):  Past Surgical History:  Procedure Laterality Date  . BACK SURGERY     lumbar laminectomy  . BIOPSY THYROID    . BREAST CYST ASPIRATION Left   . BREAST SURGERY    . BUNIONECTOMY Right   . COLONOSCOPY WITH PROPOFOL N/A 11/27/2017   Procedure: COLONOSCOPY WITH PROPOFOL;  Surgeon: Virgel Manifold, MD;  Location: [facility] ENDOSCOPY;  Service: Endoscopy;  Laterality: N/A;    MEDICATIONS:  Prior to Admission medications   Medication Sig Start Date End Date Taking? Authorizing Provider  alendronate (FOSAMAX) 70 MG tablet Take 1 tablet (70 mg total) by mouth every 7 (seven) days. Take with a full glass of water on an empty stomach. 10/16/17  Yes Lada, Satira Anis, MD  aspirin EC 81 MG tablet Take 1 tablet (81 mg total) by mouth daily. 05/03/15  Yes Lada, Satira Anis, MD  beta carotene w/minerals (OCUVITE) tablet Take 1 tablet by mouth daily. Reported on 10/27/2015   Yes [provider]  calcium carbonate (OS-CAL) 600 MG TABS tablet Take 600 mg by mouth 2 (two) times daily with a meal. Reported on 10/27/2015   Yes [provider]  cholecalciferol (VITAMIN D) 1000 UNITS tablet Take 1,000 Units by mouth daily. Reported on 10/27/2015   Yes [provider]  ciclesonide (OMNARIS) 50 MCG/ACT nasal spray Place 2 sprays into both nostrils daily. Reported on 10/27/2015   Yes [provider]  diphenhydrAMINE (BENADRYL) 25 mg capsule Take 25 mg by mouth every 4 (four) hours as needed. Reported on 10/27/2015   Yes [provider]  doxycycline (VIBRAMYCIN) 50 MG capsule Take 50 mg by mouth daily.  10/06/15  Yes [provider]  levothyroxine (SYNTHROID, LEVOTHROID) 75 MCG tablet Take 1 tablet (75 mcg total) by mouth daily. 05/07/18  Yes Lada, Satira Anis, MD  mometasone (NASONEX) 50 MCG/ACT nasal spray Place 2 sprays into the nose daily. 08/22/17  Yes Lada, Satira Anis, MD  Multiple Vitamin (MULTIVITAMIN) capsule Take 1 capsule by mouth daily.   Yes [provider]  Omega-3 Fatty Acids (FISH OIL PO) Take 2 capsules by mouth daily. Reported on 10/27/2015   Yes [provider]  PAZEO 0.7 % SOLN Place 1 drop into both eyes daily. Reported on 10/27/2015 07/09/14  Yes [provider]  Probiotic Product (PROBIOTIC DAILY PO) Take 1 capsule by mouth as needed. Reported on 10/27/2015   Yes [provider]  vitamin B-12 (CYANOCOBALAMIN) 100 MCG tablet Take 100 mcg by mouth daily. Reported on 10/27/2015   Yes [provider]  vitamin E 400 UNIT capsule Take 400 Units by mouth daily. Reported on 10/27/2015   Yes [provider]    ALLERGIES:  Allergies  Allergen Reactions  . Codeine Other (See Comments)    Ears ringing  . Lovastatin Rash  . Nickel Rash  . Tape Rash     SOCIAL HISTORY:  Social History   Socioeconomic History  . Marital status: Divorced    Spouse name: Not on file  . Number of children: 3  . Years of education: some college  . Highest education level: 12th grade  Occupational History  . Occupation: Retired  Scientific laboratory technician  . Financial resource strain: Not hard at all  . Food insecurity:    Worry: Never true    Inability: Never true  . Transportation needs:    Medical: No    Non-medical: No  Tobacco Use  . Smoking status: Never Smoker  . Smokeless tobacco: Never Used  . Tobacco comment: smoking cesaation materials not required  Substance and Sexual Activity  . Alcohol use: Yes    Alcohol/week: 0.0 standard drinks    Comment: occasional glass of wine  . Drug use: No  . Sexual activity: Not Currently  Lifestyle  . Physical activity:    Days per week: 0 days    Minutes per session: 0 min  . Stress: Not at all  Relationships  . Social connections:    Talks on phone: Patient refused    Gets together: Patient refused    Attends religious service: Patient refused    Active member of club or organization: Patient refused    Attends meetings of clubs or organizations: Patient refused     Relationship status: Divorced  . Intimate partner violence:    Fear of current or ex partner: No    Emotionally abused: No    Physically abused: No    Forced sexual activity: No  Other Topics Concern  . Not on file  Social History Narrative  . Not on file    The patient currently resides (home / rehab facility / nursing home): Home The patient normally is (ambulatory / bedbound): Ambulatory  FAMILY HISTORY:  Family History  Problem Relation Age of Onset  . Asthma Mother   . Hypertension Mother   . Diabetes Mother   . Cancer Mother  Leukemia  . Peripheral vascular disease Mother   . Osteoporosis Mother   . Glaucoma Mother   . Heart disease Father        coronary artery occlusion  . Early death Father   . Heart attack Father   . Hypertension Sister   . Diabetes Sister   . Glaucoma Sister   . Diabetes Maternal Grandmother   . Colon cancer Maternal Uncle   . Lung cancer Paternal Uncle     Otherwise negative/non-contributory.  REVIEW OF SYSTEMS:  Constitutional: denies any other weight loss, fever, chills, or sweats  Eyes: denies any other vision changes, history of eye injury  ENT: denies sore throat, hearing problems  Respiratory: denies shortness of breath, wheezing  Cardiovascular: denies chest pain, palpitations  Gastrointestinal: abdominal pain, N/V, and bowel function as per HPI Musculoskeletal: denies any other joint pains or cramps  Skin: Denies any other rashes or skin discolorations Neurological: denies any other headache, dizziness, weakness  Psychiatric: Denies any other depression, anxiety   All other review of systems were otherwise negative   VITAL SIGNS:  BP (!) 153/79   Pulse 97   Temp 97.7 F (36.5 C) (Temporal)   Resp 16   Ht 4\' 10"  (1.473 m)   Wt 115 lb 6.4 oz (52.3 kg)   SpO2 96%   BMI 24.12 kg/m   PHYSICAL EXAM:  Constitutional:  -- Normal body habitus  -- Awake, alert, and oriented x3  Eyes:  -- Pupils equally round and  reactive to light  -- No scleral icterus  Ear, nose, throat:  -- No jugular venous distension -- No nasal drainage, bleeding Pulmonary:  -- No crackles  -- Equal breath sounds bilaterally -- Breathing non-labored at rest Cardiovascular:  -- S1, S2 present  -- No pericardial rubs  Gastrointestinal:  -- Abdomen soft, nontender, non-distended, no guarding/rebound tenderness -- No abdominal masses appreciated, pulsatile or otherwise  Musculoskeletal and Integumentary:  -- Wounds or skin discoloration: None appreciated -- Extremities: B/L UE and LE FROM, hands and feet warm, no edema  Neurologic:  -- Motor function: Intact and symmetric -- Sensation: Intact and symmetric  Labs:  CBC Latest Ref Rng & Units 05/06/2018 06/24/2017 10/25/2015  WBC 3.8 - 10.8 Thousand/uL 7.5 6.8 8.6  Hemoglobin 11.7 - 15.5 g/dL 14.4 14.6 13.9  Hematocrit 35.0 - 45.0 % 43.1 43.4 40.4  Platelets 140 - 400 Thousand/uL 373 338 325   CMP Latest Ref Rng & Units 05/06/2018 09/04/2017 06/24/2017  Glucose 65 - 99 mg/dL 82 88 84  BUN 7 - 25 mg/dL 14 11 7   Creatinine 0.60 - 0.93 mg/dL 0.74 0.82 0.83  Sodium 135 - 146 mmol/L 142 139 139  Potassium 3.5 - 5.3 mmol/L 4.6 4.0 4.1  Chloride 98 - 110 mmol/L 106 101 104  CO2 20 - 32 mmol/L 29 31 29   Calcium 8.6 - 10.4 mg/dL 9.5 9.0 9.0  Total Protein 6.1 - 8.1 g/dL 6.6 6.3 -  Total Bilirubin 0.2 - 1.2 mg/dL 0.4 0.4 -  Alkaline Phos 33 - 130 U/L - - -  AST 10 - 35 U/L 19 21 -  ALT 6 - 29 U/L 14 13 -   Imaging studies:  Abdominal Ultrasound (05/13/2018) Small mobile gallstones. 5 mm gallbladder wall polyp. No evidence of acute cholecystitis.  CT Abdomen and Pelvis with Contrast (05/13/2018) 1. No acute abnormality of the abdomen or pelvis. 2. Normal appendix. 3. Colonic diverticulosis without acute diverticulitis. 4.  Aortic atherosclerosis.  Pelvic Ultrasound (  05/13/2018) 1. No acute findings.  No findings to account for pelvic pain. 2. Thin endometrium with a  sliver of endometrial fluid. Possible tiny, 4 x 1 x 4 mm, endometrial lesion. Consider follow-up hysterosonography or hysteroscopy. 3. Small, 6 mm, uterine fibroid. 4. No other abnormalities.  Assessment/Plan:  73 y.o. female with what appears to be asymptomatic gallbladder polyp and cholelithiasis, complicated by unrelated Right flank pain, GERD, uterine fibroid with endometriosis, and co-morbidities including impaired fasting glucose, HTN, HLD, thyroid disease (nodule, not otherwise specified, followed by endocrinology), OSA, osteoarthritis, osteoporosis, fibrocystic breasts, and degenerative spine disease.   - imaging study results discussed with patient  - indications for cholecystectomy in context of gallbladder polyp were discussed  - all risks, benefits, and alternatives to cholecystectomy were discussed with the patient, all of her questions were answered to her expressed satisfaction, patient expresses she wishes to proceed, and informed consent was obtained.  - patient requests to call office to schedule surgery after her gyn appointment  - will plan for laparoscopic cholecystectomy pending anesthesia and OR availability  - consider once daily omeprazole x 30 days and continue if provides GERD relief  - anticipate return to clinic 2 weeks after above planned procedure  - instructed to call if any questions or concerns  All of the above recommendations were discussed with the patient, and all of patient's questions were answered to her expressed satisfaction.  Thank you for the opportunity to participate in this patient's care.  -- Marilynne Drivers Rosana Hoes, MD, [place]: [facility] - Partnering for exceptional care. Office: [phone]

## 2018-05-27 NOTE — Patient Instructions (Signed)
Patient will call the office to schedule surgery with Dr.Davis for gallbladder.  Call the office with any questions or concerns.     Laparoscopic Cholecystectomy Laparoscopic cholecystectomy is surgery to remove the gallbladder. The gallbladder is a pear-shaped organ that lies beneath the liver on the right side of the body. The gallbladder stores bile, which is a fluid that helps the body to digest fats. Cholecystectomy is often done for inflammation of the gallbladder (cholecystitis). This condition is usually caused by a buildup of gallstones (cholelithiasis) in the gallbladder. Gallstones can block the flow of bile, which can result in inflammation and pain. In severe cases, emergency surgery may be required. This procedure is done though small incisions in your abdomen (laparoscopic surgery). A thin scope with a camera (laparoscope) is inserted through one incision. Thin surgical instruments are inserted through the other incisions. In some cases, a laparoscopic procedure may be turned into a type of surgery that is done through a larger incision (open surgery). Tell a health care provider about:  Any allergies you have.  All medicines you are taking, including vitamins, herbs, eye drops, creams, and over-the-counter medicines.  Any problems you or family members have had with anesthetic medicines.  Any blood disorders you have.  Any surgeries you have had.  Any medical conditions you have.  Whether you are pregnant or may be pregnant. What are the risks? Generally, this is a safe procedure. However, problems may occur, including:  Infection.  Bleeding.  Allergic reactions to medicines.  Damage to other structures or organs.  A stone remaining in the common bile duct. The common bile duct carries bile from the gallbladder into the small intestine.  A bile leak from the cyst duct that is clipped when your gallbladder is removed. What happens before the procedure? Staying  hydrated Follow instructions from your health care provider about hydration, which may include:  Up to 2 hours before the procedure - you may continue to drink clear liquids, such as water, clear fruit juice, black coffee, and plain tea. Eating and drinking restrictions Follow instructions from your health care provider about eating and drinking, which may include:  8 hours before the procedure - stop eating heavy meals or foods such as meat, fried foods, or fatty foods.  6 hours before the procedure - stop eating light meals or foods, such as toast or cereal.  6 hours before the procedure - stop drinking milk or drinks that contain milk.  2 hours before the procedure - stop drinking clear liquids. Medicines  Ask your health care provider about: ? Changing or stopping your regular medicines. This is especially important if you are taking diabetes medicines or blood thinners. ? Taking medicines such as aspirin and ibuprofen. These medicines can thin your blood. Do not take these medicines before your procedure if your health care provider instructs you not to.  You may be given antibiotic medicine to help prevent infection. General instructions  Let your health care provider know if you develop a cold or an infection before surgery.  Plan to have someone take you home from the hospital or clinic.  Ask your health care provider how your surgical site will be marked or identified. What happens during the procedure?   To reduce your risk of infection: ? Your health care team will wash or sanitize their hands. ? Your skin will be washed with soap. ? Hair may be removed from the surgical area.  An IV tube may be inserted into  one of your veins.  You will be given one or more of the following: ? A medicine to help you relax (sedative). ? A medicine to make you fall asleep (general anesthetic).  A breathing tube will be placed in your mouth.  Your surgeon will make several small  cuts (incisions) in your abdomen.  The laparoscope will be inserted through one of the small incisions. The camera on the laparoscope will send images to a TV screen (monitor) in the operating room. This lets your surgeon see inside your abdomen.  Air-like gas will be pumped into your abdomen. This will expand your abdomen to give the surgeon more room to perform the surgery.  Other tools that are needed for the procedure will be inserted through the other incisions. The gallbladder will be removed through one of the incisions.  Your common bile duct may be examined. If stones are found in the common bile duct, they may be removed.  After your gallbladder has been removed, the incisions will be closed with stitches (sutures), staples, or skin glue.  Your incisions may be covered with a bandage (dressing). The procedure may vary among health care providers and hospitals. What happens after the procedure?  Your blood pressure, heart rate, breathing rate, and blood oxygen level will be monitored until the medicines you were given have worn off.  You will be given medicines as needed to control your pain.  Do not drive for 24 hours if you were given a sedative. This information is not intended to replace advice given to you by your health care provider. Make sure you discuss any questions you have with your health care provider. Document Released: 04/02/2005 Document Revised: 02/28/2017 Document Reviewed: 09/19/2015 Elsevier Interactive Patient Education  2019 [employer]. Cholelithiasis  Cholelithiasis is also called "gallstones." It is a kind of gallbladder disease. The gallbladder is an organ that stores a liquid (bile) that helps you digest fat. Gallstones may not cause symptoms (may be silent gallstones) until they cause a blockage, and then they can cause pain (gallbladder attack). Follow these instructions at home:  Take over-the-counter and prescription medicines only as told by  your doctor.  Stay at a healthy weight.  Eat healthy foods. This includes: ? Eating fewer fatty foods, like fried foods. ? Eating fewer refined carbs (refined carbohydrates). Refined carbs are breads and grains that are highly processed, like white bread and white rice. Instead, choose whole grains like whole-wheat bread and brown rice. ? Eating more fiber. Almonds, fresh fruit, and beans are healthy sources of fiber.  Keep all follow-up visits as told by your doctor. This is important. Contact a doctor if:  You have sudden pain in the upper right side of your belly (abdomen). Pain might spread to your right shoulder or your chest. This may be a sign of a gallbladder attack.  You feel sick to your stomach (are nauseous).  You throw up (vomit).  You have been diagnosed with gallstones that have no symptoms and you get: ? Belly pain. ? Discomfort, burning, or fullness in the upper part of your belly (indigestion). Get help right away if:  You have sudden pain in the upper right side of your belly, and it lasts for more than 2 hours.  You have belly pain that lasts for more than 5 hours.  You have a fever or chills.  You keep feeling sick to your stomach or you keep throwing up.  Your skin or the whites of your eyes  turn yellow (jaundice).  You have dark-colored pee (urine).  You have light-colored poop (stool). Summary  Cholelithiasis is also called "gallstones."  The gallbladder is an organ that stores a liquid (bile) that helps you digest fat.  Silent gallstones are gallstones that do not cause symptoms.  A gallbladder attack may cause sudden pain in the upper right side of your belly. Pain might spread to your right shoulder or your chest. If this happens, contact your doctor.  If you have sudden pain in the upper right side of your belly that lasts for more than 2 hours, get help right away. This information is not intended to replace advice given to you by your  health care provider. Make sure you discuss any questions you have with your health care provider. Document Released: 09/19/2007 Document Revised: 12/18/2015 Document Reviewed: 12/18/2015 Elsevier Interactive Patient Education  2019 [place] If you have a gallbladder condition, you may have trouble digesting fats. Eating a low-fat diet can help reduce your symptoms, and may be helpful before and after having surgery to remove your gallbladder (cholecystectomy). Your health care provider may recommend that you work with a diet and nutrition specialist (dietitian) to help you reduce the amount of fat in your diet. What are tips for following this plan? General guidelines  Limit your fat intake to less than 30% of your total daily calories. If you eat around 1,800 calories each day, this is less than 60 grams (g) of fat per day.  Fat is an important part of a healthy diet. Eating a low-fat diet can make it hard to maintain a healthy body weight. Ask your dietitian how much fat, calories, and other nutrients you need each day.  Eat small, frequent meals throughout the day instead of three large meals.  Drink at least 8-10 cups of fluid a day. Drink enough fluid to keep your urine clear or pale yellow.  Limit alcohol intake to no more than 1 drink a day for nonpregnant women and 2 drinks a day for men. One drink equals 12 oz of beer, 5 oz of wine, or 1 oz of hard liquor. Reading food labels  Check Nutrition Facts on food labels for the amount of fat per serving. Choose foods with less than 3 grams of fat per serving. Shopping  Choose nonfat and low-fat healthy foods. Look for the words "nonfat," "low fat," or "fat free."  Avoid buying processed or prepackaged foods. Cooking  Cook using low-fat methods, such as baking, broiling, grilling, or boiling.  Cook with small amounts of healthy fats, such as olive oil, grapeseed oil, canola oil, or sunflower oil. What  foods are recommended?   All fresh, frozen, or canned fruits and vegetables.  Whole grains.  Low-fat or non-fat (skim) milk and yogurt.  Lean meat, skinless poultry, fish, eggs, and beans.  Low-fat protein supplement powders or drinks.  Spices and herbs. What foods are not recommended?  High-fat foods. These include baked goods, fast food, fatty cuts of meat, ice cream, french toast, sweet rolls, pizza, cheese bread, foods covered with butter, creamy sauces, or cheese.  Fried foods. These include french fries, tempura, battered fish, breaded chicken, fried breads, and sweets.  Foods with strong odors.  Foods that cause bloating and gas. Summary  A low-fat diet can be helpful if you have a gallbladder condition, or before and after gallbladder surgery.  Limit your fat intake to less than 30% of your total daily calories. This is  about 60 g of fat if you eat 1,800 calories each day.  Eat small, frequent meals throughout the day instead of three large meals. This information is not intended to replace advice given to you by your health care provider. Make sure you discuss any questions you have with your health care provider. Document Released: 04/07/2013 Document Revised: 05/10/2016 Document Reviewed: 05/10/2016 Elsevier Interactive Patient Education  2019 [employer].

## 2018-05-28 ENCOUNTER — Encounter: Payer: Self-pay | Admitting: Obstetrics and Gynecology

## 2018-05-28 ENCOUNTER — Ambulatory Visit (INDEPENDENT_AMBULATORY_CARE_PROVIDER_SITE_OTHER): Payer: Medicare Other | Admitting: Obstetrics and Gynecology

## 2018-05-28 VITALS — BP 113/64 | HR 81 | Ht <= 58 in | Wt 116.0 lb

## 2018-05-28 DIAGNOSIS — K8020 Calculus of gallbladder without cholecystitis without obstruction: Secondary | ICD-10-CM

## 2018-05-28 NOTE — Progress Notes (Signed)
Patient comes in today for a new gyn appointment. She had a [place] on 05/13/2018 that showed fibroids per patient.

## 2018-05-28 NOTE — Progress Notes (Signed)
HPI:      [name] is a 73 y.o. No obstetric history on file. who LMP was No LMP recorded. Patient is postmenopausal.  Subjective:   She presents today stating that she has right upper quadrant pain and upper back pain.  As part of her work-up she underwent a CT of the abdomen and pelvis followed by an ultrasound of the pelvis.  She had some concerns regarding the findings at ultrasound. Of significant note she has no pelvic complaints.  Denies vaginal bleeding, denies pelvic pain denies issues with urination or bowel movements.    Hx: The following portions of the patient's history were reviewed and updated as appropriate:             She  has a past medical history of Allergic rhinitis, Allergy, Arthritis, Bilateral ovarian cysts, Bunion, Cataract of both eyes, Deviated septum, Elevated BP, Fibrocystic breast, GERD (gastroesophageal reflux disease), Hammer toe, History of closed fracture of nasal bones, History of pneumonia, Hyperlipidemia, IFG (impaired fasting glucose), Ocular rosacea, OSA (obstructive sleep apnea), Osteoarthritis cervical spine, Osteoarthritis of both hands, Osteoporosis, Rosacea, Shingles, Thyroid disease, Thyroid nodule, and Vitamin D deficiency disease. She does not have any pertinent problems on file. She  has a past surgical history that includes Breast surgery; Bunionectomy (Right); Back surgery; Biopsy thyroid; Breast cyst aspiration (Left); and Colonoscopy with propofol (N/A, 11/27/2017). Her family history includes Asthma in her mother; Cancer in her mother; Colon cancer in her maternal uncle; Diabetes in her maternal grandmother, mother, and sister; Early death in her father; Glaucoma in her mother and sister; Heart attack in her father; Heart disease in her father; Hypertension in her mother and sister; Lung cancer in her paternal uncle; Osteoporosis in her mother; Peripheral vascular disease in her mother. She  reports that she has never smoked. She has never  used smokeless tobacco. She reports current alcohol use. She reports that she does not use drugs. She has a current medication list which includes the following prescription(s): aspirin ec, beta carotene w/minerals, calcium carbonate, cholecalciferol, ciclesonide, diphenhydramine, levothyroxine, mometasone, multivitamin, pazeo, alendronate, doxycycline, omega-3 fatty acids, probiotic product, vitamin b-12, and vitamin e. She is allergic to codeine; lovastatin; nickel; and tape.       Review of Systems:  Review of Systems  Constitutional: Denied constitutional symptoms, night sweats, recent illness, fatigue, fever, insomnia and weight loss.  Eyes: Denied eye symptoms, eye pain, photophobia, vision change and visual disturbance.  Ears/Nose/Throat/Neck: Denied ear, nose, throat or neck symptoms, hearing loss, nasal discharge, sinus congestion and sore throat.  Cardiovascular: Denied cardiovascular symptoms, arrhythmia, chest pain/pressure, edema, exercise intolerance, orthopnea and palpitations.  Respiratory: Denied pulmonary symptoms, asthma, pleuritic pain, productive sputum, cough, dyspnea and wheezing.  Gastrointestinal: Denied, gastro-esophageal reflux, melena, nausea and vomiting.  Genitourinary: Denied genitourinary symptoms including symptomatic vaginal discharge, pelvic relaxation issues, and urinary complaints.  Musculoskeletal: Denied musculoskeletal symptoms, stiffness, swelling, muscle weakness and myalgia.  Dermatologic: Denied dermatology symptoms, rash and scar.  Neurologic: Denied neurology symptoms, dizziness, headache, neck pain and syncope.  Psychiatric: Denied psychiatric symptoms, anxiety and depression.  Endocrine: Denied endocrine symptoms including hot flashes and night sweats.   Meds:   Current Outpatient Medications on File Prior to Visit  Medication Sig Dispense Refill  . aspirin EC 81 MG tablet Take 1 tablet (81 mg total) by mouth daily. 30 tablet 11  . beta  carotene w/minerals (OCUVITE) tablet Take 1 tablet by mouth daily. Reported on 10/27/2015    . calcium  carbonate (OS-CAL) 600 MG TABS tablet Take 600 mg by mouth 2 (two) times daily with a meal. Reported on 10/27/2015    . cholecalciferol (VITAMIN D) 1000 UNITS tablet Take 1,000 Units by mouth daily. Reported on 10/27/2015    . ciclesonide (OMNARIS) 50 MCG/ACT nasal spray Place 2 sprays into both nostrils daily. Reported on 10/27/2015    . diphenhydrAMINE (BENADRYL) 25 mg capsule Take 25 mg by mouth every 4 (four) hours as needed. Reported on 10/27/2015    . levothyroxine (SYNTHROID, LEVOTHROID) 75 MCG tablet Take 1 tablet (75 mcg total) by mouth daily. 30 tablet 11  . mometasone (NASONEX) 50 MCG/ACT nasal spray Place 2 sprays into the nose daily. 17 g 11  . Multiple Vitamin (MULTIVITAMIN) capsule Take 1 capsule by mouth daily.    Marland Kitchen PAZEO 0.7 % SOLN Place 1 drop into both eyes daily. Reported on 10/27/2015  5  . alendronate (FOSAMAX) 70 MG tablet Take 1 tablet (70 mg total) by mouth every 7 (seven) days. Take with a full glass of water on an empty stomach. (Patient not taking: Reported on 05/28/2018) 4 tablet 11  . doxycycline (VIBRAMYCIN) 50 MG capsule Take 50 mg by mouth daily.   3  . Omega-3 Fatty Acids (FISH OIL PO) Take 2 capsules by mouth daily. Reported on 10/27/2015    . Probiotic Product (PROBIOTIC DAILY PO) Take 1 capsule by mouth as needed. Reported on 10/27/2015    . vitamin B-12 (CYANOCOBALAMIN) 100 MCG tablet Take 100 mcg by mouth daily. Reported on 10/27/2015    . vitamin E 400 UNIT capsule Take 400 Units by mouth daily. Reported on 10/27/2015     No current facility-administered medications on file prior to visit.     Objective:     Vitals:   05/28/18 1434  BP: 113/64  Pulse: 81              CAT scan and pelvic ultrasound reviewed in detail.  Each small abnormality reviewed.  Endometrial thickness reviewed.  Assessment:    No obstetric history on file. Patient Active Problem  List   Diagnosis Date Noted  . Gallstones 05/15/2018  . Aortic atherosclerosis ([place]) 05/06/2018  . Special screening for malignant neoplasms, colon   . Thoracogenic scoliosis of thoracic region 10/16/2017  . Gall bladder polyp 09/17/2017  . Essential hypertension 07/31/2017  . GERD (gastroesophageal reflux disease) 07/31/2017  . Full code status 07/31/2017  . Myalgia due to HMG CoA reductase inhibitor 07/31/2017  . Preventative health care 11/07/2015  . Medication monitoring encounter 08/05/2015  . Abnormal mammogram of left breast 01/18/2015  . OSA (obstructive sleep apnea)   . Hyperlipidemia   . Thyroid disease   . Cataract of both eyes   . Ocular rosacea   . Bunion   . Hammer toe   . Allergic rhinitis   . Hypothyroidism 08/03/2014  . Left thyroid nodule 08/03/2014  . IFG (impaired fasting glucose) 08/03/2014  . Osteopenia 08/03/2014  . SOB (shortness of breath) 11/05/2013  . Anemia, unspecified 08/21/2013  . Depression 08/21/2013  . Osteoarthritis 08/21/2013     1. Calculus of gallbladder without cholecystitis without obstruction     Based on her CT and ultrasound findings I believe that her pelvis is completely normal.  There is nothing worrisome noted on either scan.   Plan:            1.  I have reassured her regarding her small uterine fibroid, endometrial thickness and the presence  of a hypoechoic lesion in a postmenopausal woman without bleeding or symptoms.  2.  I think it is wise to simply have her cholecystectomy as scheduled and she needs nothing further pelvic leg. Orders No orders of the defined types were placed in this encounter.   No orders of the defined types were placed in this encounter.     F/U  Return for Pt to contact us if symptoms worsen. I spent 22 minutes involved in the care of this patient of which greater than 50% was spent discussing her CT and ultrasound findings.  All of her questions were answered.  She brought all of her own  records with her and we have reviewed these systematically.  Finis Bud, M.D. 05/28/2018 2:55 PM

## 2018-05-29 ENCOUNTER — Telehealth: Payer: Self-pay | Admitting: *Deleted

## 2018-05-29 NOTE — Telephone Encounter (Signed)
Patient called the office back and states that she is ready to schedule surgery. She wants this to be scheduled for 06-09-18 at [facility] with Dr. Rosana Hoes.  The patient is aware she will need to Pre-Admit. Patient will check in at the [address], [address] (first floor). Patient will be contacted with date/time once [facility] arranges.  The patient is aware to call the office should she have further questions.

## 2018-05-30 ENCOUNTER — Telehealth: Payer: Self-pay

## 2018-05-30 NOTE — Telephone Encounter (Signed)
Spoke with the patient about her pre admit date and time, 06/05/18 at 1:30 pm. She is fine with this but needs to change the date of her surgery from 06/09/18 to 06/11/18 as the person flying in to help her will not be in town until that Monday. He surgery has been rescheduled to 06/11/18. She is aware of dates, and time.

## 2018-06-05 ENCOUNTER — Other Ambulatory Visit: Payer: Self-pay

## 2018-06-05 ENCOUNTER — Encounter
Admission: RE | Admit: 2018-06-05 | Discharge: 2018-06-05 | Disposition: A | Discharge status: Home / Self Care | Payer: Medicare Other | Source: Ambulatory Visit | Attending: Surgery | Admitting: Surgery

## 2018-06-05 DIAGNOSIS — K824 Cholesterolosis of gallbladder: Secondary | ICD-10-CM

## 2018-06-05 DIAGNOSIS — Z01812 Encounter for preprocedural laboratory examination: Secondary | ICD-10-CM | POA: Diagnosis not present

## 2018-06-05 HISTORY — DX: Adverse effect of unspecified anesthetic, initial encounter: T41.45XA

## 2018-06-05 HISTORY — DX: Failed or difficult intubation, initial encounter: T88.4XXA

## 2018-06-05 HISTORY — DX: Other allergic rhinitis: J30.89

## 2018-06-05 HISTORY — DX: Other complications of anesthesia, initial encounter: T88.59XA

## 2018-06-05 MED ORDER — CHLORHEXIDINE GLUCONATE CLOTH 2 % EX PADS
6.0000 | MEDICATED_PAD | Freq: Once | CUTANEOUS | Status: DC
  Filled 2018-06-05: qty 6

## 2018-06-05 NOTE — Patient Instructions (Signed)
Your procedure is scheduled on: 06/11/2018 Wed Report to Same Day Surgery 2nd floor medical mall [facility] Entrance-take elevator on left to 2nd floor.  Check in with surgery information desk.) To find out your arrival time please call [phone] between 1PM - 3PM on 06/10/2018 Tues  Remember: Instructions that are not followed completely may result in serious medical risk, up to and including death, or upon the discretion of your surgeon and anesthesiologist your surgery may need to be rescheduled.    _x___ 1. Do not eat food after midnight the night before your procedure. You may drink clear liquids up to 2 hours before you are scheduled to arrive at the hospital for your procedure.  Do not drink clear liquids within 2 hours of your scheduled arrival to the hospital.  Clear liquids include  --Water or Apple juice without pulp  --Clear carbohydrate beverage such as ClearFast or Gatorade  --Black Coffee or Clear Tea (No milk, no creamers, do not add anything to                  the coffee or Tea Type 1 and type 2 diabetics should only drink water.   ____Ensure clear carbohydrate drink on the way to the hospital for bariatric patients  ____Ensure clear carbohydrate drink 3 hours before surgery for Dr Dwyane Luo patients if physician instructed.   No gum chewing or hard candies.     __x__ 2. No Alcohol for 24 hours before or after surgery.   __x__3. No Smoking or e-cigarettes for 24 prior to surgery.  Do not use any chewable tobacco products for at least 6 hour prior to surgery   ____  4. Bring all medications with you on the day of surgery if instructed.    __x__ 5. Notify your doctor if there is any change in your medical condition     (cold, fever, infections).    x___6. On the morning of surgery brush your teeth with toothpaste and water.  You may rinse your mouth with mouth wash if you wish.  Do not swallow any toothpaste or mouthwash.   Do not wear jewelry, make-up,  hairpins, clips or nail polish.  Do not wear lotions, powders, or perfumes. You may wear deodorant.  Do not shave 48 hours prior to surgery. Men may shave face and neck.  Do not bring valuables to the hospital.    [facility] is not responsible for any belongings or valuables.               Contacts, dentures or bridgework may not be worn into surgery.  Leave your suitcase in the car. After surgery it may be brought to your room.  For patients admitted to the hospital, discharge time is determined by your                       treatment team.  _  Patients discharged the day of surgery will not be allowed to drive home.  You will need someone to drive you home and stay with you the night of your procedure.    Please read over the following fact sheets that you were given:   [facility] Preparing for Surgery and or MRSA Information   _x___ Take anti-hypertensive listed below, cardiac, seizure, asthma,     anti-reflux and psychiatric medicines. These include:  1.  levothyroxine (SYNTHROID, LEVOTHROID) 75 MCG tablet  2.mometasone (NASONEX) 50 MCG/ACT nasal spray  3.  4.  5.  6.  ____Fleets enema or Magnesium Citrate as directed.   _x___ Use CHG Soap or sage wipes as directed on instruction sheet   ____ Use inhalers on the day of surgery and bring to hospital day of surgery  ____ Stop Metformin and Janumet 2 days prior to surgery.    ____ Take 1/2 of usual insulin dose the night before surgery and none on the morning     surgery.   _x___ Follow recommendations from Cardiologist, Pulmonologist or PCP regarding          stopping Aspirin, Coumadin, Plavix ,Eliquis, Effient, or Pradaxa, and Pletal.  X____Stop Anti-inflammatories such as Advil, Aleve, Ibuprofen, Motrin, Naproxen, Naprosyn, Goodies powders or aspirin products. OK to take Tylenol and                          Celebrex.   _x___ Stop supplements until after surgery.  But may continue Vitamin D, Vitamin B,       and  multivitamin.   ____ Bring C-Pap to the hospital.

## 2018-06-09 ENCOUNTER — Telehealth: Payer: Self-pay | Admitting: *Deleted

## 2018-06-09 NOTE — Telephone Encounter (Signed)
Patient called the office back since she had not heard back from our office yet.   I did explain to the patient that Dr. Rosana Hoes is in surgery and we are just waiting for him to send a response.   The patient verbalizes understanding and wishes to get surgery rescheduled from 06-11-18 to 07-04-18.  Patient aware this will be done as requested.   She is aware all instructions will remain the same that were given to her at time of her pre-admit visit.   Patient given the number to call to find out arrival time day of surgery-[phone].   The patient is aware to call the office should she have further questions.

## 2018-06-09 NOTE — Telephone Encounter (Signed)
Patient called the office today stating that she needs to get surgery rescheduled from 06-11-18.  The patient states that she does not have any help that day and would like to reschedule for the end of the week since son will need to be with her.   Patient would like to reschedule to 06-20-18 if Dr. Rosana Hoes is willing depending on surgery time. Secure chat message sent to Dr. Rosana Hoes today. Will await Dr. Shann Medal response.   If not, patient would like to reschedule for 07-04-18.   Patient will be contacted once we hear from Dr. Rosana Hoes.

## 2018-06-30 ENCOUNTER — Telehealth: Payer: Self-pay | Admitting: Surgery

## 2018-06-30 NOTE — Telephone Encounter (Signed)
Patient is calling asking if she needs to go through with the surgery or wait due to the coronavirus. Patient would like you to contact her. She can be reached at [phone]. Please call and advise.

## 2018-07-01 ENCOUNTER — Telehealth: Payer: Self-pay | Admitting: *Deleted

## 2018-07-01 NOTE — Telephone Encounter (Signed)
Left a message for the patient to call the office. 07/01/2018

## 2018-07-01 NOTE — Telephone Encounter (Signed)
Patient called the office back requesting a call from Dr. Rosana Hoes. She is scheduled for surgery with him on 07-04-18. Patient is unsure about proceeding with surgery due to the virus. She also states Dr. Rosana Hoes wanted her to have surgery as soon as possible.   Message routed to Dr. Rosana Hoes.

## 2018-07-01 NOTE — Telephone Encounter (Signed)
Patient's call returned after her messages were received that patient would like to discuss rescheduling her upcoming cholecystectomy due to recommended "social distancing" for Covid-19 coronavirus outbreak. Though I was prepared to reassure her that it is both reasonable and safe to reschedule her upcoming cholecystectomy until after containment efforts have demonstrated control of the Covid-19 coronavirus outbreak, patient states she has since her appointment noticed that what she'd previously described as Right flank pain is clearly RUQ abdominal pain that occurs worst after dairy and meats. This initially improved following avoidance of such foods, but has since again worsened. She further recalls these were the same symptoms for which her sister underwent emergent cholecystectomy with subsequent relief, and she expresses concern that her pain will continue to worsen while Covid-19 coronavirus increases in [facility]. Meanwhile, her Right flank swelling (attributed to her scoliosis per her primary care physician) has resolved.  Risks and benefits to rescheduling vs proceeding as scheduled were discussed. Patient expresses she does not think she can wait with such pain and wants to proceed with cholecystectomy this Friday. Repeat ultrasound could also be considered, but if proceeding with surgery this Friday regardless, would be unlikely to change her management. Pre-op CBC and CMP will be ordered.  All of patient's questions were answered to her expressed satisfaction, and patient was instructed to call office if any further questions arise, present to ED if severe constant pain, fevers.  -- Marilynne Drivers Rosana Hoes, MD, [place]: [facility] - Partnering for exceptional care. Office: [phone]

## 2018-07-02 ENCOUNTER — Telehealth: Payer: Self-pay | Admitting: *Deleted

## 2018-07-02 NOTE — Telephone Encounter (Signed)
Patient called the office today wanting to reschedule surgery that was scheduled for 07-04-18 even though she discussed this with Dr. Rosana Hoes and recommended to proceed with surgery on Friday.   The patient states she is not in any pain today. She thinks it may be related to stress.   Patient wishes to reschedule surgery to 08-06-18.  The patient is aware to notify our office if she needs [place] prior to surgery date above.

## 2018-07-19 ENCOUNTER — Encounter: Payer: Self-pay | Admitting: Family Medicine

## 2018-07-22 ENCOUNTER — Telehealth: Payer: Self-pay | Admitting: *Deleted

## 2018-07-22 NOTE — Telephone Encounter (Signed)
Patient called the office today and states that she is doing better. She has eliminated dairy and is eating low fat which seems to be helping. Patient states she has lost a little bit of weight because of this.   The patient is still hesitant to proceed with surgery as scheduled for 08-06-18 due to COVID-19. She wishes for surgery to be cancelled at present.   Patient wishes to be contacted once COVID-19 restrictions have been lifted.   O[facility]R. notified of cancellation.   Note routed to Dr. Rosana Hoes.

## 2018-08-06 ENCOUNTER — Encounter: Admission: RE | Payer: Self-pay | Source: Home / Self Care

## 2018-08-06 ENCOUNTER — Ambulatory Visit: Admission: RE | Admit: 2018-08-06 | Payer: Medicare Other | Source: Home / Self Care | Admitting: Surgery

## 2018-08-06 SURGERY — LAPAROSCOPIC CHOLECYSTECTOMY
Anesthesia: General

## 2018-08-28 ENCOUNTER — Telehealth: Payer: Self-pay

## 2018-08-28 ENCOUNTER — Telehealth: Payer: Self-pay | Admitting: Obstetrics and Gynecology

## 2018-08-28 NOTE — Telephone Encounter (Signed)
Message left for patient to see if she would like to schedule her surgery with Dr Rosana Hoes at [facility] in [place] or if she wants to wait until [facility] is open for surgery scheduling.

## 2018-08-28 NOTE — Telephone Encounter (Signed)
The patient called and stated that she missed a call from our office, Was not able to see documentation of missed call. Thank you.

## 2018-08-28 NOTE — Telephone Encounter (Signed)
Patient called back and wants to wait until Angela Kelly is ready for surgery scheduling

## 2018-08-28 NOTE — Telephone Encounter (Signed)
We will call the patient back once able to schedule at [employer]

## 2018-09-20 ENCOUNTER — Other Ambulatory Visit: Payer: Self-pay | Admitting: Family Medicine

## 2018-10-14 ENCOUNTER — Telehealth: Payer: Self-pay | Admitting: *Deleted

## 2018-10-14 NOTE — Telephone Encounter (Signed)
Patient was contacted today in regards to getting surgery rescheduled that was previously postponed due to COVID-19.   Due to COVID-19, patient wishes to hold off on surgery right now. She will contact the office in the future if she wishes to proceed.   Note routed to Dr. Rosana Hoes.

## 2018-11-05 ENCOUNTER — Other Ambulatory Visit: Payer: Self-pay | Admitting: Family Medicine

## 2018-11-05 NOTE — Telephone Encounter (Signed)
LVM to schedule appt

## 2018-11-05 NOTE — Telephone Encounter (Signed)
Have patient schedule routine appointment within 3 months

## 2018-12-26 ENCOUNTER — Ambulatory Visit (INDEPENDENT_AMBULATORY_CARE_PROVIDER_SITE_OTHER): Payer: Medicare Other | Admitting: Family Medicine

## 2018-12-26 ENCOUNTER — Encounter: Payer: Self-pay | Admitting: Family Medicine

## 2018-12-26 ENCOUNTER — Other Ambulatory Visit: Payer: Self-pay

## 2018-12-26 VITALS — BP 110/80 | HR 84 | Temp 97.0°F | Resp 14 | Ht <= 58 in | Wt 106.8 lb

## 2018-12-26 DIAGNOSIS — I700 Atherosclerosis of aorta: Secondary | ICD-10-CM

## 2018-12-26 DIAGNOSIS — R2231 Localized swelling, mass and lump, right upper limb: Secondary | ICD-10-CM | POA: Diagnosis not present

## 2018-12-26 DIAGNOSIS — E039 Hypothyroidism, unspecified: Secondary | ICD-10-CM | POA: Diagnosis not present

## 2018-12-26 DIAGNOSIS — Z1239 Encounter for other screening for malignant neoplasm of breast: Secondary | ICD-10-CM

## 2018-12-26 DIAGNOSIS — R7301 Impaired fasting glucose: Secondary | ICD-10-CM

## 2018-12-26 DIAGNOSIS — E782 Mixed hyperlipidemia: Secondary | ICD-10-CM | POA: Diagnosis not present

## 2018-12-26 DIAGNOSIS — D649 Anemia, unspecified: Secondary | ICD-10-CM | POA: Diagnosis not present

## 2018-12-26 DIAGNOSIS — Z23 Encounter for immunization: Secondary | ICD-10-CM | POA: Diagnosis not present

## 2018-12-26 DIAGNOSIS — M85859 Other specified disorders of bone density and structure, unspecified thigh: Secondary | ICD-10-CM | POA: Diagnosis not present

## 2018-12-26 NOTE — Progress Notes (Signed)
Name: Angela Kelly   MRN: [id number]    DOB: [birth year]   Date:12/26/2018       Progress Note  Chief Complaint  Patient presents with   Follow-up   Hypothyroidism   Cyst    right cubital space     Subjective:   Angela Kelly is a 73 y.o. female, presents to clinic for routine follow up on the conditions listed above.  Hypothyroidism, unspecified type 75 mcg levothyroixine, no recent med changes First thing in the morning before eating and before any other meds/supplements Current symptoms: none . Patient denies change in energy level, diarrhea, heat / cold intolerance, nervousness, palpitations and weight changes. Her thyroid disease and levothyroxine dosing has been stable for many years  Screening for breast cancer Due for screening had abnormal mammogram in the past - ordered today but unable to get ordered without a Medicare waiver I did try ordering several different ways with different diagnoses codes.  She does have a history of abnormal mammogram in the past, she is a very healthy 73 year old female, she does not recall her last mammogram.  Reviewed chart and her last mammogram was June 26, 2017 BI-RADS Category 1 negative with recommended screening mammogram repeated in 1 year.  Need for influenza vaccination - done today  Essential hypertension - in chart as dx, but pt denies I reviewed her flow sheet for her vital signs and trended her blood pressure over several years and I do not see that her blood pressure is ever been extremely high and patient denies any history of being on medications to control her blood pressure so I will delete this off the chart as a diagnosis. BP Readings from Last 3 Encounters:  12/26/18 110/80  06/05/18 (!) 139/91  05/28/18 113/64  BP controlled today w/o meds, no concerning sx. Pt denies headaches, visual disturbances, chest pain, chest pressure, shortness of breath, exertional symptoms, palpitations, lower extremity edema, orthopnea,  PND  Aortic atherosclerosis (HCC) Noted in chart, patient is not on any medications for cholesterol, she does have allergy to lovastatin with a rash, tries to have a healthy diet. She denies any claudication symptoms, denies any invasive cardiac testing or any vascular specialty testing  Osteopenia of neck of femur, right neck Holding fosamax dues to rash concerns Doing her vit d and calcium supplements Pt has not actually taken the medication and has not had any new rash, she is just very nervous about it because of rash being listed as a side effect and she reports that in the past she has had shingles and when she has rashes occur it does seem to come above her right eyebrow in the same place that she had shingles in the past. Did discuss the medication benefits versus risk of side effects.  We discussed the pharmacology and possible side effects.  Also discussed her osteopenia history and risk of hip fracture patient stated that she will give the medicine a try.  She was encouraged to take correctly with full glass of water remaining upright for at least an hour after taking the medication and plan would be to take it 2 weeks in a row and if she was going to have a side effect of a rash I would expect her to have it within the first 1 to 2 weeks and for her to contact us with any rash or side effects she is concerned about.  IFG (impaired fasting glucose) Noted in the chart in the past, patient  denies any history of diabetes or have any check her blood sugar at home  Mixed hyperlipidemia Diet controlled, not on any meds, past intolerance of statin Lab Results  Component Value Date   CHOL 240 (H) 05/06/2018   HDL 57 05/06/2018   LDLCALC 157 (H) 05/06/2018   TRIG 140 05/06/2018   CHOLHDL 4.2 05/06/2018  She denies any hx of stroke, CAD/MI, denies any claudication sx.  She did acknowledge that she has a history of elevated cholesterol and overall has a healthy diet and tries to exercise,  with covid pandemic she has been eating healthier  Anemia, unspecified type History of anemia, no current sx, pt denies SOB, rapid heart rate, cold intolerance, pallor.  She is not on any supplements, and she denies any known blood loss denies melena, hematochezia.  She will likely be having cholecystectomy soon will check her blood levels today  Patient notes an enlarged area of skin and tissue to her right AC fossa/elbow area she states that it was very enlarged a day or 2 ago when she noticed it it soft and squishy it is nontender which worried her.  She denies any injury to that area, denies any recent strain.  She states that the size of enlargement has decreased since she noticed it.  She denies any redness.  Patient Active Problem List   Diagnosis Date Noted   Gallstones 05/15/2018   Aortic atherosclerosis ([place]) 05/06/2018   Thoracogenic scoliosis of thoracic region 10/16/2017   Gall bladder polyp 09/17/2017   Essential hypertension 07/31/2017   GERD (gastroesophageal reflux disease) 07/31/2017   Full code status 07/31/2017   Myalgia due to HMG CoA reductase inhibitor 07/31/2017   Abnormal mammogram of left breast 01/18/2015   OSA (obstructive sleep apnea)    Hyperlipidemia    Thyroid disease    Cataract of both eyes    Ocular rosacea    Bunion    Hammer toe    Allergic rhinitis    Hypothyroidism 08/03/2014   Left thyroid nodule 08/03/2014   IFG (impaired fasting glucose) 08/03/2014   Osteopenia 08/03/2014   SOB (shortness of breath) 11/05/2013   Anemia, unspecified 08/21/2013   Depression 08/21/2013   Osteoarthritis 08/21/2013    Past Surgical History:  Procedure Laterality Date   BACK SURGERY     lumbar laminectomy   BIOPSY THYROID     BREAST CYST ASPIRATION Left    BREAST SURGERY     BUNIONECTOMY Right    COLONOSCOPY WITH PROPOFOL N/A 11/27/2017   Procedure: COLONOSCOPY WITH PROPOFOL;  Surgeon: Virgel Manifold, MD;  Location:  [facility] ENDOSCOPY;  Service: Endoscopy;  Laterality: N/A;    Family History  Problem Relation Age of Onset   Asthma Mother    Hypertension Mother    Diabetes Mother    Cancer Mother        Leukemia   Peripheral vascular disease Mother    Osteoporosis Mother    Glaucoma Mother    Heart disease Father        coronary artery occlusion   Early death Father    Heart attack Father    Hypertension Sister    Diabetes Sister    Glaucoma Sister    Diabetes Maternal Grandmother    Colon cancer Maternal Uncle    Lung cancer Paternal Uncle     Social History   Socioeconomic History   Marital status: Divorced    Spouse name: Not on file   Number of children: 3  Years of education: some college   Highest education level: 12th grade  Occupational History   Occupation: Retired  Scientist, product/process development strain: Not hard at [employer] insecurity    Worry: Never true    Inability: Never true   Transportation needs    Medical: No    Non-medical: No  Tobacco Use   Smoking status: Never Smoker   Smokeless tobacco: Never Used   Tobacco comment: smoking cesaation materials not required  Substance and Sexual Activity   Alcohol use: Yes    Alcohol/week: 0.0 standard drinks    Comment: rare   Drug use: No   Sexual activity: Not Currently  Lifestyle   Physical activity    Days per week: 0 days    Minutes per session: 0 min   Stress: Not at all  Relationships   Social connections    Talks on phone: Patient refused    Gets together: Patient refused    Attends religious service: Patient refused    Active member of club or organization: Patient refused    Attends meetings of clubs or organizations: Patient refused    Relationship status: Divorced   Intimate partner violence    Fear of current or ex partner: No    Emotionally abused: No    Physically abused: No    Forced sexual activity: No  Other Topics Concern   Not on file  Social  History Narrative   Not on file     Current Outpatient Medications:    acetaminophen (TYLENOL) 500 MG tablet, Take 500 mg by mouth every 6 (six) hours as needed., Disp: , Rfl:    aspirin 325 MG tablet, Take 325-650 mg by mouth daily as needed for moderate pain., Disp: , Rfl:    aspirin EC 81 MG tablet, Take 1 tablet (81 mg total) by mouth daily. (Patient taking differently: Take 81 mg by mouth at bedtime. ), Disp: 30 tablet, Rfl: 11   beta carotene w/minerals (OCUVITE) tablet, Take 1 tablet by mouth daily. , Disp: , Rfl:    Calcium Carb-Cholecalciferol (SM CALCIUM/VITAMIN D) 600-800 MG-UNIT TABS, Take 1 tablet by mouth daily., Disp: , Rfl:    calcium carbonate (TUMS - DOSED IN MG ELEMENTAL CALCIUM) 500 MG chewable tablet, Chew 1-3 tablets by mouth daily as needed for indigestion or heartburn., Disp: , Rfl:    Carboxymethylcellul-Glycerin (LUBRICATING EYE DROPS OP), Place 1 drop into both eyes daily as needed (allergies/dryness)., Disp: , Rfl:    ciclesonide (OMNARIS) 50 MCG/ACT nasal spray, Place 1-2 sprays into both nostrils at bedtime. , Disp: , Rfl:    CRANBERRY CONCENTRATE PO, Take 500 mg by mouth as needed., Disp: , Rfl:    diphenhydrAMINE (BENADRYL) 25 mg capsule, Take 25 mg by mouth See admin instructions. Take 25 mg every night and may take an additional 25 mg dose during the day as needed for allergies, Disp: , Rfl:    diphenhydrAMINE (SOMINEX) 25 MG tablet, Take 25 mg by mouth at bedtime as needed for sleep., Disp: , Rfl:    doxycycline (VIBRAMYCIN) 50 MG capsule, Take 50 mg by mouth daily. , Disp: , Rfl: 3   hydrocortisone cream 0.5 %, Apply 1 application topically 2 (two) times daily as needed for itching., Disp: , Rfl:    levothyroxine (SYNTHROID, LEVOTHROID) 75 MCG tablet, Take 1 tablet (75 mcg total) by mouth daily., Disp: 30 tablet, Rfl: 11   mometasone (NASONEX) 50 MCG/ACT nasal spray, PLACE 2 SPRAYS  INTO THE NOSE DAILY., Disp: 17 g, Rfl: 3   Multiple Vitamin  (MULTIVITAMIN) capsule, Take 1 capsule by mouth daily., Disp: , Rfl:    neomycin-bacitracin-polymyxin (NEOSPORIN) ointment, Apply 1 application topically as needed for wound care., Disp: , Rfl:    NON FORMULARY, Take 1 tablet by mouth daily after lunch. occuvite multivitamin, Disp: , Rfl:    Omega-3 Fatty Acids (FISH OIL PO), Take 1 capsule by mouth daily. , Disp: , Rfl:    PAZEO 0.7 % SOLN, Place 1 drop into both eyes at bedtime. , Disp: , Rfl: 5   Probiotic Product (PROBIOTIC DAILY PO), Take 1 capsule by mouth daily as needed (when taking antibiotics). , Disp: , Rfl:    Tetrahydrozoline HCl (VISINE OP), Apply to eye as needed., Disp: , Rfl:    alendronate (FOSAMAX) 70 MG tablet, Take 1 tablet (70 mg total) by mouth every Friday. Take with a full glass of water on an empty stomach. (Patient not taking: Reported on 12/26/2018), Disp: 12 tablet, Rfl: 0  Allergies  Allergen Reactions   Nickel Rash    Almost went blind when wearing nickel eye glasses   Codeine Other (See Comments)    Ears ringing   Lovastatin Rash   Tape Rash    I personally reviewed active problem list, medication list, allergies, family history, social history, health maintenance, lab results with the patient/caregiver today.  Review of Systems  Constitutional: Negative.   HENT: Negative.   Eyes: Negative.   Respiratory: Negative.   Cardiovascular: Negative.   Gastrointestinal: Negative.   Endocrine: Negative.   Genitourinary: Negative.   Musculoskeletal: Negative.   Skin: Negative.   Allergic/Immunologic: Negative.   Neurological: Negative.   Hematological: Negative.   Psychiatric/Behavioral: Negative.   All other systems reviewed and are negative.    Objective:    Vitals:   12/26/18 0915  BP: 110/80  Pulse: 84  Resp: 14  Temp: (!) 97 F (36.1 C)  SpO2: 97%  Weight: 106 lb 12.8 oz (48.4 kg)  Height: 4\' 10"  (1.473 m)    Body mass index is 22.32 kg/m.  Physical Exam Vitals signs and  nursing note reviewed.  Constitutional:      General: She is not in acute distress.    Appearance: Normal appearance. She is well-developed. She is not ill-appearing, toxic-appearing or diaphoretic.     Interventions: Face mask in place.  HENT:     Head: Normocephalic and atraumatic.     Right Ear: External ear normal.     Left Ear: External ear normal.  Eyes:     General: Lids are normal. No scleral icterus.       Right eye: No discharge.        Left eye: No discharge.     Conjunctiva/sclera: Conjunctivae normal.     Pupils: Pupils are equal, round, and reactive to light.  Neck:     Musculoskeletal: Normal range of motion and neck supple.     Trachea: Phonation normal. No tracheal deviation.  Cardiovascular:     Rate and Rhythm: Normal rate and regular rhythm.     Pulses: Normal pulses.          Radial pulses are 2+ on the right side and 2+ on the left side.       Posterior tibial pulses are 2+ on the right side and 2+ on the left side.     Heart sounds: Normal heart sounds. No murmur. No friction rub. No gallop.  Pulmonary:     Effort: Pulmonary effort is normal. No respiratory distress.     Breath sounds: Normal breath sounds. No stridor. No wheezing, rhonchi or rales.  Chest:     Chest wall: No tenderness.  Abdominal:     General: Bowel sounds are normal. There is no distension.     Palpations: Abdomen is soft.     Tenderness: There is no abdominal tenderness. There is no guarding or rebound.  Musculoskeletal: Normal range of motion.        General: No deformity.     Right elbow: She exhibits swelling (right inner elbow skin/tissue enlarged, soft, non-tender, no nodule, no induration, no erythema). She exhibits normal range of motion, no effusion, no deformity and no laceration. No tenderness found.  Lymphadenopathy:     Cervical: No cervical adenopathy.  Skin:    General: Skin is warm and dry.     Capillary Refill: Capillary refill takes less than 2 seconds.      Coloration: Skin is not jaundiced or pale.     Findings: No rash.     Nails: There is no clubbing.   Neurological:     Mental Status: She is alert and oriented to person, place, and time.     Motor: No abnormal muscle tone.     Gait: Gait normal.  Psychiatric:        Speech: Speech normal.        Behavior: Behavior normal. Behavior is cooperative.      PHQ2/9: Depression screen [facility] 2/9 12/26/2018 05/20/2018 05/06/2018 10/16/2017 07/31/2017  Decreased Interest 0 0 0 0 0  Down, Depressed, Hopeless 0 0 0 0 0  PHQ - 2 Score 0 0 0 0 0  Altered sleeping 0 0 0 - -  Tired, decreased energy 0 0 0 - -  Change in appetite 0 0 0 - -  Feeling bad or failure about yourself  0 0 0 - -  Trouble concentrating 0 0 0 - -  Moving slowly or fidgety/restless 0 0 0 - -  Suicidal thoughts 0 0 0 - -  PHQ-9 Score 0 0 0 - -  Difficult doing work/chores Not difficult at all - Not difficult at all - -    phq 9 is negative Reviewed today  Fall Risk: Fall Risk  12/26/2018 05/27/2018 05/20/2018 05/06/2018 10/16/2017  Falls in the past year? 0 0 0 0 No  Number falls in past yr: 0 0 - 0 -  Injury with Fall? 0 0 - 0 -  Risk for fall due to : - - - - -  Risk for fall due to: Comment - - - - -      Functional Status Survey: Is the patient deaf or have difficulty hearing?: No Does the patient have difficulty seeing, even when wearing glasses/contacts?: (S) Yes Does the patient have difficulty concentrating, remembering, or making decisions?: No Does the patient have difficulty walking or climbing stairs?: No Does the patient have difficulty dressing or bathing?: No Does the patient have difficulty doing errands alone such as visiting a doctor's office or shopping?: No    Assessment & Plan:    1. Hypothyroidism, unspecified type Stable in the past we will recheck TSH today no symptoms of chemical hypothyroidism - TSH  2. Aortic atherosclerosis (HCC) Cholesterol elevated, not on any treatments - COMPLETE  METABOLIC PANEL WITH GFR - Lipid panel  3. Osteopenia of neck of femur, unspecified laterality Patient not taking Fosamax discussed at  length today, she agrees to try the medication will follow-up with [place] if any side effects, rash or concerns  4. IFG (impaired fasting glucose) We will recheck today - COMPLETE METABOLIC PANEL WITH GFR - Hemoglobin A1c  5. Mixed hyperlipidemia Not on any medications, managed with diet, poorly controlled with very elevated cholesterol panels pretty consistently in the past couple years, will recheck labs today - Lipid panel  6. Anemia, unspecified type We will recheck blood levels since patient will be having surgery hopefully shortly - CBC with Differential/Platelet  7. Need for influenza vaccination Done today - Flu Vaccine QUAD High Dose(Fluad)  8. Arm mass, right Will try and get soft tissue US ordered to eval -to me it feels like adipose tissue I do not feel any nodules or lymph nodes, no concern for cellulitis, it is improving on its own and my plan would be to continue to watch and see if it goes away but patient is very anxious and insists on ultrasound.  9. Screening for malignant neoplasm of breast Trying to order a follow-up mammogram -but having difficulty letting the order go through the chart today  Pt having biliary colic and signs of cholecystitis she was strongly encouraged to call her surgeon's office today she has been waiting since earlier this year to have cholecystectomy.  She is not having nausea and vomiting by careful diet but she is having constant pain that is much worse at night so I do suspect fair degree of cholecystitis.  Return in about 6 months (around 06/25/2019).   Delsa Grana, PA-C 12/26/18 9:37 AM

## 2018-12-26 NOTE — Addendum Note (Signed)
Addended by: Marland Kitchen A on: 12/26/2018 12:03 PM   Modules accepted: Orders

## 2018-12-26 NOTE — Addendum Note (Signed)
Addended by: Delsa Grana on: 12/26/2018 12:27 PM   Modules accepted: Orders

## 2018-12-27 LAB — CBC WITH DIFFERENTIAL/PLATELET
Absolute Monocytes: 442 cells/uL (ref 200–950)
Basophils Absolute: 41 cells/uL (ref 0–200)
Basophils Relative: 0.6 %
Eosinophils Absolute: 231 cells/uL (ref 15–500)
Eosinophils Relative: 3.4 %
HCT: 43.9 % (ref 35.0–45.0)
Hemoglobin: 14.3 g/dL (ref 11.7–15.5)
Lymphs Abs: 1659 cells/uL (ref 850–3900)
MCH: 29 pg (ref 27.0–33.0)
MCHC: 32.6 g/dL (ref 32.0–36.0)
MCV: 89 fL (ref 80.0–100.0)
MPV: 10.7 fL (ref 7.5–12.5)
Monocytes Relative: 6.5 %
Neutro Abs: 4427 cells/uL (ref 1500–7800)
Neutrophils Relative %: 65.1 %
Platelets: 332 10*3/uL (ref 140–400)
RBC: 4.93 10*6/uL (ref 3.80–5.10)
RDW: 12.9 % (ref 11.0–15.0)
Total Lymphocyte: 24.4 %
WBC: 6.8 10*3/uL (ref 3.8–10.8)

## 2018-12-27 LAB — COMPLETE METABOLIC PANEL WITH GFR
AG Ratio: 1.8 (calc) (ref 1.0–2.5)
ALT: 14 U/L (ref 6–29)
AST: 21 U/L (ref 10–35)
Albumin: 4.1 g/dL (ref 3.6–5.1)
Alkaline phosphatase (APISO): 95 U/L (ref 37–153)
BUN: 14 mg/dL (ref 7–25)
CO2: 28 mmol/L (ref 20–32)
Calcium: 9.1 mg/dL (ref 8.6–10.4)
Chloride: 105 mmol/L (ref 98–110)
Creat: 0.75 mg/dL (ref 0.60–0.93)
GFR, Est African American: 90 mL/min/{1.73_m2} (ref >=60)
GFR, Est Non African American: 78 mL/min/{1.73_m2} (ref >=60)
Globulin: 2.3 g/dL (calc) (ref 1.9–3.7)
Glucose, Bld: 81 mg/dL (ref 65–99)
Potassium: 4.7 mmol/L (ref 3.5–5.3)
Sodium: 142 mmol/L (ref 135–146)
Total Bilirubin: 0.4 mg/dL (ref 0.2–1.2)
Total Protein: 6.4 g/dL (ref 6.1–8.1)

## 2018-12-27 LAB — LIPID PANEL
Cholesterol: 201 mg/dL — ABNORMAL HIGH (ref <200)
HDL: 47 mg/dL — ABNORMAL LOW (ref >=50)
LDL Cholesterol (Calc): 122 mg/dL (calc) — ABNORMAL HIGH
Non-HDL Cholesterol (Calc): 154 mg/dL (calc) — ABNORMAL HIGH (ref <130)
Total CHOL/HDL Ratio: 4.3 (calc) (ref <5.0)
Triglycerides: 207 mg/dL — ABNORMAL HIGH (ref <150)

## 2018-12-27 LAB — TSH: TSH: 0.53 mIU/L (ref 0.40–4.50)

## 2018-12-27 LAB — HEMOGLOBIN A1C
Hgb A1c MFr Bld: 5.4 % of total Hgb (ref <5.7)
Mean Plasma Glucose: 108 (calc)
eAG (mmol/L): 6 (calc)

## 2018-12-31 ENCOUNTER — Ambulatory Visit: Payer: Medicare Other | Attending: Family Medicine

## 2019-01-26 ENCOUNTER — Other Ambulatory Visit: Payer: Self-pay | Admitting: Family Medicine

## 2019-02-02 DIAGNOSIS — H179 Unspecified corneal scar and opacity: Secondary | ICD-10-CM | POA: Diagnosis not present

## 2019-02-14 ENCOUNTER — Ambulatory Visit: Admit: 2019-02-14 | Payer: Medicare Other | Admitting: Surgery

## 2019-02-14 SURGERY — LAPAROSCOPIC CHOLECYSTECTOMY
Anesthesia: General

## 2019-04-07 ENCOUNTER — Other Ambulatory Visit: Payer: Self-pay | Admitting: Family Medicine

## 2019-04-20 ENCOUNTER — Other Ambulatory Visit: Payer: Self-pay | Admitting: Family Medicine

## 2019-04-21 ENCOUNTER — Other Ambulatory Visit: Payer: Self-pay | Admitting: Family Medicine

## 2019-04-21 NOTE — Telephone Encounter (Signed)
Requested medication (s) are due for refill today: no  Requested medication (s) are on the active medication list: yes  Last refill:  04/07/2019  Future visit scheduled:no  Notes to clinic:  Pharmacy comment: REQUEST FOR 90 DAYS PRESCRIPTION  Requested Prescriptions  Pending Prescriptions Disp Refills   mometasone (NASONEX) 50 MCG/ACT nasal spray [Pharmacy Med Name: MOMETASONE FUROATE 50 MCG SPRY] 17 g 2    Sig: SPRAY 2 SPRAYS INTO EACH NOSTRIL EVERY DAY      Ear, Nose, and Throat: Nasal Preparations - Corticosteroids Passed - 04/21/2019 10:32 AM      Passed - Valid encounter within last 12 months    Recent Outpatient Visits           3 months ago Hypothyroidism, unspecified type   [facility] Delsa Grana, PA-C   11 months ago Right upper quadrant pain   [facility] Lada, Satira Anis, MD   11 months ago Flank pain   [facility] Lada, Satira Anis, MD   1 year ago [place] bladder polyp   [facility] Lada, Satira Anis, MD   1 year ago Mid back pain on right side   [place], NP

## 2019-04-21 NOTE — Telephone Encounter (Signed)
Spoke with pt and informed her that it was to soon for the prescription. Pt verbalized understanding and said she does not need the refill right now.

## 2019-06-16 ENCOUNTER — Ambulatory Visit: Payer: Self-pay | Admitting: *Deleted

## 2019-06-16 NOTE — Telephone Encounter (Signed)
Pt called and notified, to do go get checked out especially if she thinks it could be shingles.  Pt state has an appt with eye dr.

## 2019-06-16 NOTE — Telephone Encounter (Signed)
Pt calling with complaints of a rash on forehead, bilateral eye lids and under her eyes that has been present for about a week. Pt states that the rash has worsened the past 2-3 days. 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  "Looks like it could be shingles, red pushes skin up, like blisters and itchy". Benadryl seems to help and cortisone cream is also being used. Rates itching at 5-6 now.   Called office to speak with [facility] to see if patient could be scheduled for a earlier appt. No appt availability at this time. Pt advised to seek treatment at Urgent Care.Pt verbalized understanding.    Reason for Disposition . Localized rash present > 7 days  Answer Assessment - Initial Assessment Questions 1. APPEARANCE of RASH: "Describe the rash."      Around eye lids, raised areas like blisters 2. LOCATION: "Where is the rash located?"      Bilateral eyes 3. NUMBER: "How many spots are there?"      n/a 4. SIZE: "How big are the spots?" (Inches, centimeters or compare to size of a coin)      n/a 5. ONSET: "When did the rash start?"      About a week ago 6. ITCHING: "Does the rash itch?" If so, ask: "How bad is the itch?"  (Scale 1-10; or mild, moderate, severe)     5-6 7. PAIN: "Does the rash hurt?" If so, ask: "How bad is the pain?"  (Scale 1-10; or mild, moderate, severe)     No complaints of pain form the rash 8. OTHER SYMPTOMS: "Do you have any other symptoms?" (e.g., fever)     no 9. PREGNANCY: "Is there any chance you are pregnant?" "When was your last menstrual period?"     n/a  Protocols used: RASH OR REDNESS - LOCALIZED-A-AH

## 2019-09-04 ENCOUNTER — Ambulatory Visit: Payer: Self-pay | Admitting: *Deleted

## 2019-09-04 NOTE — Telephone Encounter (Signed)
Pt called stating it looks like she has shingles on both sides on nose, and is traveling under her left eye; she says the area is swollen; the area is along the the area where her glasses rest; th ept is concerned because she had shingles before that affected her eyes, and left scar tissue; recommendations made per nurse triage prototcol; she verbalized understanding, and she declines; the pt states that she does not to wait in the ED with this amount of discomfort, and she is contagious; offered pt virtual appt, and she states that her "phone will not do that"; the pt was last seen by Delsa Grana, [place], 12/26/2018; pt transferred to [facility] for final disposition.  Reason for Disposition . Patient sounds very sick or weak to the triager  Answer Assessment - Initial Assessment Questions 1. APPEARANCE of RASH: "Describe the rash."      Little pox marks, blisters and red/raw 2. LOCATION: "Where is the rash located?"      Face along where glasses rest 3. ONSET: "When did the rash start?"   09/01/19 4. ITCHING: "Does the rash itch?" If so, ask: "How bad is the itch?"  (Scale 1-10; or mild, moderate, severe)     no 5. PAIN: "Does the rash hurt?" If so, ask: "How bad is the pain?"  (Scale 1-10; or mild, moderate, severe)     Rated 10 out of 10  6. OTHER SYMPTOMS: "Do you have any other symptoms?" (e.g., fever)     no 7. PREGNANCY: "Is there any chance you are pregnant?" "When was your last menstrual period?"     no  Protocols used: [facility]

## 2019-09-04 NOTE — Telephone Encounter (Signed)
Cassandra already spoke to patient and she was told to go to urgent care or walkin

## 2019-09-08 ENCOUNTER — Ambulatory Visit (INDEPENDENT_AMBULATORY_CARE_PROVIDER_SITE_OTHER): Payer: Medicare Other | Admitting: Internal Medicine

## 2019-09-08 ENCOUNTER — Encounter: Payer: Self-pay | Admitting: Internal Medicine

## 2019-09-08 ENCOUNTER — Ambulatory Visit (INDEPENDENT_AMBULATORY_CARE_PROVIDER_SITE_OTHER): Payer: Medicare Other

## 2019-09-08 ENCOUNTER — Other Ambulatory Visit: Payer: Self-pay

## 2019-09-08 ENCOUNTER — Ambulatory Visit: Payer: Medicare Other

## 2019-09-08 VITALS — BP 144/82 | HR 105 | Temp 96.9°F | Resp 16 | Ht <= 58 in | Wt 113.3 lb

## 2019-09-08 VITALS — BP 122/82 | HR 86 | Temp 96.9°F | Resp 16 | Ht <= 58 in | Wt 113.3 lb

## 2019-09-08 DIAGNOSIS — R109 Unspecified abdominal pain: Secondary | ICD-10-CM

## 2019-09-08 DIAGNOSIS — T2010XA Burn of first degree of head, face, and neck, unspecified site, initial encounter: Secondary | ICD-10-CM | POA: Diagnosis not present

## 2019-09-08 DIAGNOSIS — R350 Frequency of micturition: Secondary | ICD-10-CM

## 2019-09-08 DIAGNOSIS — Z0000 Encounter for general adult medical examination without abnormal findings: Secondary | ICD-10-CM

## 2019-09-08 DIAGNOSIS — L309 Dermatitis, unspecified: Secondary | ICD-10-CM

## 2019-09-08 LAB — POCT URINALYSIS DIPSTICK
Appearance: NORMAL
Bilirubin, UA: NEGATIVE
Blood, UA: NEGATIVE
Glucose, UA: NEGATIVE
Ketones, UA: NEGATIVE
Leukocytes, UA: NEGATIVE
Nitrite, UA: NEGATIVE
Odor: NORMAL
Protein, UA: NEGATIVE
Spec Grav, UA: <=1.005 — AB (ref 1.010–1.025)
Urobilinogen, UA: 0.2 E.U./dL
pH, UA: 6 (ref 5.0–8.0)

## 2019-09-08 NOTE — Patient Instructions (Signed)
Angela Kelly , Thank you for taking time to come for your Medicare Wellness Visit. I appreciate your ongoing commitment to your health goals. Please review the following plan we discussed and let me know if I can assist you in the future.   Screening recommendations/referrals: Colonoscopy: done 11/27/17 Mammogram: done 06/26/17. Please call [phone] to schedule your mammogram.  Bone Density: done 02/09/17 Recommended yearly ophthalmology/optometry visit for glaucoma screening and checkup Recommended yearly dental visit for hygiene and checkup  Vaccinations: Influenza vaccine: done 12/26/18 Pneumococcal vaccine: done 11/02/13 Tdap vaccine: done 01/18/11 Shingles vaccine: Shingrix discussed. Please contact your pharmacy for coverage information.  Covid-19: done 05/13/19 & 06/03/19  Advanced directives: Advance directive discussed with you today. I have provided a copy for you to complete at home and have notarized. Once this is complete please bring a copy in to our office so we can scan it into your chart.  Conditions/risks identified: Recommend increasing physical activity   Next appointment: Please follow up in one year for your Medicare Annual Wellness visit.     Preventive Care 27 Years and Older, Female Preventive care refers to lifestyle choices and visits with your health care provider that can promote health and wellness. What does preventive care include?  A yearly physical exam. This is also called an annual well check.  Dental exams once or twice a year.  Routine eye exams. Ask your health care provider how often you should have your eyes checked.  Personal lifestyle choices, including:  Daily care of your teeth and gums.  Regular physical activity.  Eating a healthy diet.  Avoiding tobacco and drug use.  Limiting alcohol use.  Practicing safe sex.  Taking low-dose aspirin every day.  Taking vitamin and mineral supplements as recommended by your health care  provider. What happens during an annual well check? The services and screenings done by your health care provider during your annual well check will depend on your age, overall health, lifestyle risk factors, and family history of disease. Counseling  Your health care provider may ask you questions about your:  Alcohol use.  Tobacco use.  Drug use.  Emotional well-being.  Home and relationship well-being.  Sexual activity.  Eating habits.  History of falls.  Memory and ability to understand (cognition).  Work and work Statistician.  Reproductive health. Screening  You may have the following tests or measurements:  Height, weight, and BMI.  Blood pressure.  Lipid and cholesterol levels. These may be checked every 5 years, or more frequently if you are over 30 years old.  Skin check.  Lung cancer screening. You may have this screening every year starting at age 33 if you have a 30-pack-year history of smoking and currently smoke or have quit within the past 15 years.  Fecal occult blood test (FOBT) of the stool. You may have this test every year starting at age 25.  Flexible sigmoidoscopy or colonoscopy. You may have a sigmoidoscopy every 5 years or a colonoscopy every 10 years starting at age 22.  Hepatitis C blood test.  Hepatitis B blood test.  Sexually transmitted disease (STD) testing.  Diabetes screening. This is done by checking your blood sugar (glucose) after you have not eaten for a while (fasting). You may have this done every 1-3 years.  Bone density scan. This is done to screen for osteoporosis. You may have this done starting at age 50.  Mammogram. This may be done every 1-2 years. Talk to your health care provider about  how often you should have regular mammograms. Talk with your health care provider about your test results, treatment options, and if necessary, the need for more tests. Vaccines  Your health care provider may recommend certain  vaccines, such as:  Influenza vaccine. This is recommended every year.  Tetanus, diphtheria, and acellular pertussis (Tdap, Td) vaccine. You may need a Td booster every 10 years.  Zoster vaccine. You may need this after age 68.  Pneumococcal 13-valent conjugate (PCV13) vaccine. One dose is recommended after age 1.  Pneumococcal polysaccharide (PPSV23) vaccine. One dose is recommended after age 58. Talk to your health care provider about which screenings and vaccines you need and how often you need them. This information is not intended to replace advice given to you by your health care provider. Make sure you discuss any questions you have with your health care provider. Document Released: 04/29/2015 Document Revised: 12/21/2015 Document Reviewed: 02/01/2015 Elsevier Interactive Patient Education  2017 [place] Prevention in the Home Falls can cause injuries. They can happen to people of all ages. There are many things you can do to make your home safe and to help prevent falls. What can I do on the outside of my home?  Regularly fix the edges of walkways and driveways and fix any cracks.  Remove anything that might make you trip as you walk through a door, such as a raised step or threshold.  Trim any bushes or trees on the path to your home.  Use bright outdoor lighting.  Clear any walking paths of anything that might make someone trip, such as rocks or tools.  Regularly check to see if handrails are loose or broken. Make sure that both sides of any steps have handrails.  Any raised decks and porches should have guardrails on the edges.  Have any leaves, snow, or ice cleared regularly.  Use sand or salt on walking paths during winter.  Clean up any spills in your garage right away. This includes oil or grease spills. What can I do in the bathroom?  Use night lights.  Install grab bars by the toilet and in the tub and shower. Do not use towel bars as grab  bars.  Use non-skid mats or decals in the tub or shower.  If you need to sit down in the shower, use a plastic, non-slip stool.  Keep the floor dry. Clean up any water that spills on the floor as soon as it happens.  Remove soap buildup in the tub or shower regularly.  Attach bath mats securely with double-sided non-slip rug tape.  Do not have throw rugs and other things on the floor that can make you trip. What can I do in the bedroom?  Use night lights.  Make sure that you have a light by your bed that is easy to reach.  Do not use any sheets or blankets that are too big for your bed. They should not hang down onto the floor.  Have a firm chair that has side arms. You can use this for support while you get dressed.  Do not have throw rugs and other things on the floor that can make you trip. What can I do in the kitchen?  Clean up any spills right away.  Avoid walking on wet floors.  Keep items that you use a lot in easy-to-reach places.  If you need to reach something above you, use a strong step stool that has a grab bar.  Keep  electrical cords out of the way.  Do not use floor polish or wax that makes floors slippery. If you must use wax, use non-skid floor wax.  Do not have throw rugs and other things on the floor that can make you trip. What can I do with my stairs?  Do not leave any items on the stairs.  Make sure that there are handrails on both sides of the stairs and use them. Fix handrails that are broken or loose. Make sure that handrails are as long as the stairways.  Check any carpeting to make sure that it is firmly attached to the stairs. Fix any carpet that is loose or worn.  Avoid having throw rugs at the top or bottom of the stairs. If you do have throw rugs, attach them to the floor with carpet tape.  Make sure that you have a light switch at the top of the stairs and the bottom of the stairs. If you do not have them, ask someone to add them for  you. What else can I do to help prevent falls?  Wear shoes that:  Do not have high heels.  Have rubber bottoms.  Are comfortable and fit you well.  Are closed at the toe. Do not wear sandals.  If you use a stepladder:  Make sure that it is fully opened. Do not climb a closed stepladder.  Make sure that both sides of the stepladder are locked into place.  Ask someone to hold it for you, if possible.  Clearly mark and make sure that you can see:  Any grab bars or handrails.  First and last steps.  Where the edge of each step is.  Use tools that help you move around (mobility aids) if they are needed. These include:  Canes.  Walkers.  Scooters.  Crutches.  Turn on the lights when you go into a dark area. Replace any light bulbs as soon as they burn out.  Set up your furniture so you have a clear path. Avoid moving your furniture around.  If any of your floors are uneven, fix them.  If there are any pets around you, be aware of where they are.  Review your medicines with your doctor. Some medicines can make you feel dizzy. This can increase your chance of falling. Ask your doctor what other things that you can do to help prevent falls. This information is not intended to replace advice given to you by your health care provider. Make sure you discuss any questions you have with your health care provider. Document Released: 01/27/2009 Document Revised: 09/08/2015 Document Reviewed: 05/07/2014 Elsevier Interactive Patient Education  2017 [employer].

## 2019-09-08 NOTE — Progress Notes (Signed)
Patient ID: Angela Kelly, female    DOB: 08/20/1945, 74 y.o.   MRN: [id number]  PCP: Delsa Grana, PA-C  Chief Complaint  Patient presents with  . Rash    onset 10 days ago, burned forhead with curling iron and 4 days later rash came up  . Urine odor    has been worse this week  . Urinary Frequency    Subjective:   Angela Kelly is a 74 y.o. female, presents to clinic with CC of the following:  Chief Complaint  Patient presents with  . Rash    onset 10 days ago, burned forhead with curling iron and 4 days later rash came up  . Urine odor    has been worse this week  . Urinary Frequency    HPI: Patient is a 74 year old female patient of Delsa Grana She had her Medicare annual visit earlier this morning, and at that point noted the following concerns: pt c/o burn on forehead approx one week ago that has turned into rash and spread to rash on both sides of her nose. Pt seen at [facility] on 09/04/19 and prescribed valtrex and metrogel (she has not started metrogel yet) and pt already had rx for doxycycline. Pt also c/o pain on lower right side of back and urine with odor and bright yellow color.   She was added to my schedule later this morning to be seen.  Patient notes she burned her right forehead with a curling iron approximately 6 to 8 days ago, was a fairly severe burn in her opinion, and then a couple days later, she noted a rash develop on the bridge of her nose and her upper cheeks bilateral.  It was very itchy.  She has a history of shingles, and was concerned and went to [facility] and was provided acyclovir to take for 5 days, and also she has been applying a topical hydrocortisone product to the rash to help with itch.  It is not painful like shingles she has had in the past, more itchy.  She does think that rash has improved some, but still persists.  She denies any eye involvement with no pain in the eyes and no vision changes.  She denies any fevers or feeling ill.  She  also thinks the burn area is slightly improved.  She notes she does have a severe nickel allergy, and thinks her curling iron has nickel and is concerned she may have a rash related to this.  She also has been taking Benadryl-2-3 a day.  She noted some lower right back pain, and really more swelling in the area in the past few days, and thinks her urine is a little more cloudy and may have an odor to it.  She denies any burning with urination, no blood in the urine, no urinary frequency.  No fevers.  No rash has occurred in that right upper flank region.  She has had prior back surgeries noted. Denies any marked abdominal pains in association.   Patient Active Problem List   Diagnosis Date Noted  . Gallstones 05/15/2018  . Aortic atherosclerosis ([place]) 05/06/2018  . Thoracogenic scoliosis of thoracic region 10/16/2017  . Gall bladder polyp 09/17/2017  . Essential hypertension 07/31/2017  . GERD (gastroesophageal reflux disease) 07/31/2017  . Full code status 07/31/2017  . Myalgia due to HMG CoA reductase inhibitor 07/31/2017  . Abnormal mammogram of left breast 01/18/2015  . OSA (obstructive sleep apnea)   . Hyperlipidemia   .  Thyroid disease   . Cataract of both eyes   . Ocular rosacea   . Bunion   . Hammer toe   . Allergic rhinitis   . Hypothyroidism 08/03/2014  . Left thyroid nodule 08/03/2014  . IFG (impaired fasting glucose) 08/03/2014  . Osteopenia 08/03/2014  . SOB (shortness of breath) 11/05/2013  . Anemia, unspecified 08/21/2013  . Depression 08/21/2013  . Osteoarthritis 08/21/2013  . Osteoporosis 08/21/2013      Current Outpatient Medications:  .  acetaminophen (TYLENOL) 500 MG tablet, Take 500 mg by mouth every 6 (six) hours as needed., Disp: , Rfl:  .  aspirin EC 81 MG tablet, Take 1 tablet (81 mg total) by mouth daily. (Patient taking differently: Take 81 mg by mouth at bedtime. ), Disp: 30 tablet, Rfl: 11 .  calcium carbonate (TUMS - DOSED IN MG ELEMENTAL  CALCIUM) 500 MG chewable tablet, Chew 1-3 tablets by mouth daily as needed for indigestion or heartburn., Disp: , Rfl:  .  diphenhydrAMINE (BENADRYL) 25 mg capsule, Take 25 mg by mouth See admin instructions. Take 25 mg every night and may take an additional 25 mg dose during the day as needed for allergies, Disp: , Rfl:  .  hydrocortisone cream 1 %, Apply 1 application topically 2 (two) times daily., Disp: , Rfl:  .  levothyroxine (SYNTHROID) 75 MCG tablet, TAKE 1 TABLET BY MOUTH EVERY DAY, Disp: 90 tablet, Rfl: 3 .  metroNIDAZOLE (METROGEL) 1 % gel, Apply 1 application topically daily., Disp: , Rfl:  .  mometasone (NASONEX) 50 MCG/ACT nasal spray, PLACE 2 SPRAYS INTO THE NOSE DAILY., Disp: 17 g, Rfl: 1 .  PAZEO 0.7 % SOLN, Place 1 drop into both eyes at bedtime. , Disp: , Rfl: 5 .  Probiotic Product (PROBIOTIC DAILY PO), Take 1 capsule by mouth daily as needed (when taking antibiotics). , Disp: , Rfl:  .  valACYclovir (VALTREX) 1000 MG tablet, Take 1,000 mg by mouth 3 (three) times daily., Disp: , Rfl:  .  alendronate (FOSAMAX) 70 MG tablet, Take 1 tablet (70 mg total) by mouth every Friday. Take with a full glass of water on an empty stomach. (Patient not taking: Reported on 12/26/2018), Disp: 12 tablet, Rfl: 0 .  doxycycline (VIBRAMYCIN) 50 MG capsule, Take 50 mg by mouth daily. , Disp: , Rfl: 3   Allergies  Allergen Reactions  . Nickel Rash    Almost went blind when wearing nickel eye glasses  . Codeine Other (See Comments)    Ears ringing  . Lovastatin Rash  . Tape Rash     Past Surgical History:  Procedure Laterality Date  . BACK SURGERY     lumbar laminectomy  . BIOPSY THYROID    . BREAST CYST ASPIRATION Left   . BREAST SURGERY    . BUNIONECTOMY Right   . COLONOSCOPY WITH PROPOFOL N/A 11/27/2017   Procedure: COLONOSCOPY WITH PROPOFOL;  Surgeon: Virgel Manifold, MD;  Location: [facility] ENDOSCOPY;  Service: Endoscopy;  Laterality: N/A;  . EYE SURGERY    . SPINE SURGERY        Family History  Problem Relation Age of Onset  . Asthma Mother   . Hypertension Mother   . Diabetes Mother   . Cancer Mother        Leukemia  . Peripheral vascular disease Mother   . Osteoporosis Mother   . Glaucoma Mother   . Arthritis Mother   . Varicose Veins Mother   . Heart disease Father  coronary artery occlusion  . Early death Father   . Heart attack Father   . Hypertension Sister   . Diabetes Sister   . Glaucoma Sister   . Asthma Sister   . Hearing loss Sister   . Kidney disease Sister   . Vision loss Sister   . Varicose Veins Sister   . Diabetes Maternal Grandmother   . Heart disease Maternal Grandmother   . Colon cancer Maternal Uncle   . Lung cancer Paternal Uncle   . Early death Paternal Uncle   . Diabetes Maternal Grandfather      Social History   Tobacco Use  . Smoking status: Never Smoker  . Smokeless tobacco: Never Used  . Tobacco comment: smoking cesaation materials not required  Substance Use Topics  . Alcohol use: Yes    Alcohol/week: 0.0 standard drinks    Comment: Wine (very rarely).    With staff assistance, above reviewed with the patient today.  ROS: As per HPI, otherwise no specific complaints on a limited and focused system review   Results for orders placed or performed in visit on 09/08/19 (from the past 72 hour(s))  POCT Urinalysis Dipstick     Status: Abnormal   Collection Time: 09/08/19 10:58 AM  Result Value Ref Range   Color, UA Yellow    Clarity, UA Clear    Glucose, UA Negative Negative   Bilirubin, UA Negative    Ketones, UA Negative    Spec Grav, UA <=1.005 (A) 1.010 - 1.025   Blood, UA Negative    pH, UA 6.0 5.0 - 8.0   Protein, UA Negative Negative   Urobilinogen, UA 0.2 0.2 or 1.0 E.U./dL   Nitrite, UA negative    Leukocytes, UA Negative Negative   Appearance Normal    Odor Normal      PHQ2/9: Depression screen [facility] 2/9 09/08/2019 09/08/2019 12/26/2018 05/20/2018 05/06/2018  Decreased Interest 0 0  0 0 0  Down, Depressed, Hopeless 0 0 0 0 0  PHQ - 2 Score 0 0 0 0 0  Altered sleeping 0 - 0 0 0  Tired, decreased energy 0 - 0 0 0  Change in appetite 0 - 0 0 0  Feeling bad or failure about yourself  0 - 0 0 0  Trouble concentrating 0 - 0 0 0  Moving slowly or fidgety/restless 0 - 0 0 0  Suicidal thoughts 0 - 0 0 0  PHQ-9 Score 0 - 0 0 0  Difficult doing work/chores Not difficult at all - Not difficult at all - Not difficult at all   PHQ-2/9 Result is neg  Fall Risk: Fall Risk  09/08/2019 09/08/2019 12/26/2018 05/27/2018 05/20/2018  Falls in the past year? 0 0 0 0 0  Number falls in past yr: 0 0 0 0 -  Injury with Fall? 0 0 0 0 -  Risk for fall due to : - No Fall Risks;Impaired vision - - -  Risk for fall due to: Comment - - - - -  Follow up - Falls prevention discussed - - -      Objective:   Vitals:   09/08/19 1027  BP: 122/82  Pulse: 86  Resp: 16  Temp: (!) 96.9 F (36.1 C)  TempSrc: Temporal  SpO2: 98%  Weight: 113 lb 4.8 oz (51.4 kg)  Height: 4\' 10"  (1.473 m)    Body mass index is 23.68 kg/m.  Physical Exam   NAD, masked, very pleasant HEENT - [place]/AT, sclera  anicteric, PERRL, EOMI, conj - non-inj'ed, pharynx clear, oral mucosa clear without lesions.  And erythematous patch approximately the size of a tennis ball was present on her right forehead, with minimal blistering at best noted, and no open areas of concern.  Right and left of the bridge of her nose was erythematous more linear patches that were not blistering, no raised component, no marked scaling, with the erythema seemingly fading some.  No eye involvement.  No marked periorbital swelling.  No facial droop Neck - supple, no adenopathy, no rigidity  Car - RRR without m/g/r, not tachycardic Pulm- RR and effort normal at rest, CTA without wheeze or rales Abd - soft, NT, ND, no masses Back - no CVA tenderness with palpation, she notes the discomfort she feels more right of the CVA region towards the flank, with  a question of some mild swelling more likely soft tissue swelling in this area although not marked and it was nontender to palpate, with no overlying skin changes evident. Neuro/psychiatric - affect was not flat, appropriate with conversation  Alert and oriented  Grossly non-focal   Speech normal   Results for orders placed or performed in visit on 09/08/19  POCT Urinalysis Dipstick  Result Value Ref Range   Color, UA Yellow    Clarity, UA Clear    Glucose, UA Negative Negative   Bilirubin, UA Negative    Ketones, UA Negative    Spec Grav, UA <=1.005 (A) 1.010 - 1.025   Blood, UA Negative    pH, UA 6.0 5.0 - 8.0   Protein, UA Negative Negative   Urobilinogen, UA 0.2 0.2 or 1.0 E.U./dL   Nitrite, UA negative    Leukocytes, UA Negative Negative   Appearance Normal    Odor Normal        Assessment & Plan:    1. Acute right flank pain/cloudy urine and question odor by patient The urine dip in the office was entirely negative.  Do not feel there is any infection concern.  Noted that her hydration level can sometimes account for some cloudiness of the urine.  Reassured having no burning with urination or frequency.  Do not feel a urine culture is needed presently. Exact cause of the flank pain unclear, and not painful to palpate, just some intermittent discomfort noted and the question of some mild swelling. We will continue to monitor at this point, and if symptoms are increasing or more problematic and not resolving over time, should follow-up. - POCT Urinalysis Dipstick   2. Dermatitis Do feel that the rash that developed on the face a couple days after the burn may be a allergic dermatitis, possibly a seborrheic dermatitis, and does seem more inflammatory.  She was treated with acyclovir for possible shingles, and has 1 more day left of a 5-day course that was given, and can finish that.  Does not burn like the shingles she has had previously, and I do not think it is  shingles. Can use the topical hydrocortisone product sparingly as needed for the itch, and also continue the Benadryl product as she is taking to help as well. Do feel the mask she is wearing intermittently may be exacerbating, with it right at the area where the wire rim is pressed harder against the skin and can be potentially irritating.  Trying to lessen use of the mask as much as possible also will be helpful.   3. Superficial burn of face, initial encounter Do feel the burn is improving,  as does she, and emphasized is keeping the area clean and do think will continue to improve/resolve.   For follow-up if not improving or worsening with the above conditions.      Towanda Malkin, MD 09/08/19 11:30 AM

## 2019-09-08 NOTE — Progress Notes (Signed)
Subjective:   ADALEIA CIVELLO is a 75 y.o. female who presents for Medicare Annual (Subsequent) preventive examination.  Review of Systems:   Cardiac Risk Factors include: advanced age (>27men, >83 women);sedentary lifestyle;dyslipidemia     Objective:     Vitals: BP (!) 144/82 (BP Location: Right Arm, Patient Position: Sitting, Cuff Size: Normal)   Pulse (!) 105   Temp (!) 96.9 F (36.1 C) (Temporal)   Resp 16   Ht 4\' 10"  (1.473 m)   Wt 113 lb 4.8 oz (51.4 kg)   SpO2 98%   BMI 23.68 kg/m   Body mass index is 23.68 kg/m.  Advanced Directives 09/08/2019 06/05/2018 11/27/2017 07/23/2017 11/14/2016 11/02/2016 09/19/2016  Does Patient Have a Medical Advance Directive? No No No Yes Yes Yes Yes  Type of Advance Directive - - Public librarian;Living will [place];Living will - Living will;Healthcare Power of [place] in Chart? - - - No - copy requested No - copy requested - -  Would patient like information on creating a medical advance directive? Yes (MAU/Ambulatory/Procedural Areas - Information given) - - - - - -    Tobacco Social History   Tobacco Use  Smoking Status Never Smoker  Smokeless Tobacco Never Used  Tobacco Comment   smoking cesaation materials not required     Counseling given: Not Answered Comment: smoking cesaation materials not required   Clinical Intake:  Pre-visit preparation completed: Yes  Pain : 0-10 Pain Score: 4  Pain Type: Acute pain Pain Location: Back Pain Orientation: Right, Lower Pain Descriptors / Indicators: Discomfort, Aching Pain Onset: More than a month ago Pain Frequency: Intermittent     BMI - recorded: 23.68 Nutritional Status: BMI of 19-24  Normal Nutritional Risks: None Diabetes: No  How often do you need to have someone help you when you read instructions, pamphlets, or other written materials from your doctor or pharmacy?: 1 - Never  Interpreter Needed?:  No  Information entered by :: Clemetine Marker LPN  Past Medical History:  Diagnosis Date  . Allergic rhinitis   . Allergy   . Anemia   . Arthritis   . Bilateral ovarian cysts    laterality not known  . Bunion   . Cataract of both eyes   . Complication of anesthesia    Bridge x 2 perm., crowns front   . Deviated septum   . Difficult intubation   . Elevated BP   . Environmental and seasonal allergies   . Essential hypertension   . Fibrocystic breast   . GERD (gastroesophageal reflux disease)   . Hammer toe   . History of closed fracture of nasal bones   . History of pneumonia    in her 24's  . Hyperlipidemia   . IFG (impaired fasting glucose)   . Ocular rosacea    see's [facility]  . OSA (obstructive sleep apnea)   . Osteoarthritis cervical spine   . Osteoarthritis of both hands   . Osteoporosis   . Osteoporosis   . Rosacea    telengiectatic  . Shingles    left eye  . Sleep apnea   . Thyroid disease   . Thyroid nodule    left, sees Endocrinology  . Vitamin D deficiency disease    Past Surgical History:  Procedure Laterality Date  . BACK SURGERY     lumbar laminectomy  . BIOPSY THYROID    . BREAST CYST  ASPIRATION Left   . BREAST SURGERY    . BUNIONECTOMY Right   . COLONOSCOPY WITH PROPOFOL N/A 11/27/2017   Procedure: COLONOSCOPY WITH PROPOFOL;  Surgeon: Virgel Manifold, MD;  Location: [facility] ENDOSCOPY;  Service: Endoscopy;  Laterality: N/A;  . EYE SURGERY    . SPINE SURGERY     Family History  Problem Relation Age of Onset  . Asthma Mother   . Hypertension Mother   . Diabetes Mother   . Cancer Mother        Leukemia  . Peripheral vascular disease Mother   . Osteoporosis Mother   . Glaucoma Mother   . Arthritis Mother   . Varicose Veins Mother   . Heart disease Father        coronary artery occlusion  . Early death Father   . Heart attack Father   . Hypertension Sister   . Diabetes Sister   . Glaucoma Sister   . Asthma Sister   .  Hearing loss Sister   . Kidney disease Sister   . Vision loss Sister   . Varicose Veins Sister   . Diabetes Maternal Grandmother   . Heart disease Maternal Grandmother   . Colon cancer Maternal Uncle   . Lung cancer Paternal Uncle   . Early death Paternal Uncle   . Diabetes Maternal Grandfather    Social History   Socioeconomic History  . Marital status: Divorced    Spouse name: Not on file  . Number of children: 3  . Years of education: some college  . Highest education level: 12th grade  Occupational History  . Occupation: Retired  Tobacco Use  . Smoking status: Never Smoker  . Smokeless tobacco: Never Used  . Tobacco comment: smoking cesaation materials not required  Substance and Sexual Activity  . Alcohol use: Yes    Alcohol/week: 0.0 standard drinks    Comment: Wine (very rarely).  . Drug use: No  . Sexual activity: Not Currently  Other Topics Concern  . Not on file  Social History Narrative   Pt's son lives with her.    Social Determinants of Health   Financial Resource Strain: Low Risk   . Difficulty of Paying Living Expenses: Not hard at all  Food Insecurity: No Food Insecurity  . Worried About Charity fundraiser in the Last Year: Never true  . Ran Out of Food in the Last Year: Never true  Transportation Needs: No Transportation Needs  . Lack of Transportation (Medical): No  . Lack of Transportation (Non-Medical): No  Physical Activity: Inactive  . Days of Exercise per Week: 0 days  . Minutes of Exercise per Session: 0 min  Stress: Stress Concern Present  . Feeling of Stress : To some extent  Social Connections: Unknown  . Frequency of Communication with Friends and Family: Patient refused  . Frequency of Social Gatherings with Friends and Family: Patient refused  . Attends Religious Services: Patient refused  . Active Member of Clubs or Organizations: Patient refused  . Attends Archivist Meetings: Patient refused  . Marital Status:  Divorced    Outpatient Encounter Medications as of 09/08/2019  Medication Sig  . acetaminophen (TYLENOL) 500 MG tablet Take 500 mg by mouth every 6 (six) hours as needed.  Marland Kitchen aspirin EC 81 MG tablet Take 1 tablet (81 mg total) by mouth daily. (Patient taking differently: Take 81 mg by mouth at bedtime. )  . calcium carbonate (TUMS - DOSED IN MG ELEMENTAL CALCIUM)  500 MG chewable tablet Chew 1-3 tablets by mouth daily as needed for indigestion or heartburn.  . diphenhydrAMINE (BENADRYL) 25 mg capsule Take 25 mg by mouth See admin instructions. Take 25 mg every night and may take an additional 25 mg dose during the day as needed for allergies  . doxycycline (VIBRAMYCIN) 50 MG capsule Take 50 mg by mouth daily.   . hydrocortisone cream 1 % Apply 1 application topically 2 (two) times daily.  Marland Kitchen levothyroxine (SYNTHROID) 75 MCG tablet TAKE 1 TABLET BY MOUTH EVERY DAY  . mometasone (NASONEX) 50 MCG/ACT nasal spray PLACE 2 SPRAYS INTO THE NOSE DAILY.  Marland Kitchen PAZEO 0.7 % SOLN Place 1 drop into both eyes at bedtime.   . Probiotic Product (PROBIOTIC DAILY PO) Take 1 capsule by mouth daily as needed (when taking antibiotics).   . valACYclovir (VALTREX) 1000 MG tablet Take 1,000 mg by mouth 3 (three) times daily.  Marland Kitchen alendronate (FOSAMAX) 70 MG tablet Take 1 tablet (70 mg total) by mouth every Friday. Take with a full glass of water on an empty stomach. (Patient not taking: Reported on 12/26/2018)  . metroNIDAZOLE (METROGEL) 1 % gel Apply 1 application topically daily.  . [DISCONTINUED] beta carotene w/minerals (OCUVITE) tablet Take 1 tablet by mouth daily.   . [DISCONTINUED] Calcium Carb-Cholecalciferol (SM CALCIUM/VITAMIN D) 600-800 MG-UNIT TABS Take 1 tablet by mouth daily.  . [DISCONTINUED] Carboxymethylcellul-Glycerin (LUBRICATING EYE DROPS OP) Place 1 drop into both eyes daily as needed (allergies/dryness).  . [DISCONTINUED] ciclesonide (OMNARIS) 50 MCG/ACT nasal spray Place 1-2 sprays into both nostrils at  bedtime.   . [DISCONTINUED] CRANBERRY CONCENTRATE PO Take 500 mg by mouth as needed.  . [DISCONTINUED] hydrocortisone cream 0.5 % Apply 1 application topically 2 (two) times daily as needed for itching.  . [DISCONTINUED] Multiple Vitamin (MULTIVITAMIN) capsule Take 1 capsule by mouth daily.  . [DISCONTINUED] NON FORMULARY Take 1 tablet by mouth daily after lunch. occuvite multivitamin  . [DISCONTINUED] Omega-3 Fatty Acids (FISH OIL PO) Take 1 capsule by mouth daily.   . [DISCONTINUED] Tetrahydrozoline HCl (VISINE OP) Apply to eye as needed.   No facility-administered encounter medications on file as of 09/08/2019.    Activities of Daily Living In your present state of health, do you have any difficulty performing the following activities: 09/08/2019 12/26/2018  Hearing? N N  Comment declines hearing aids -  Vision? N Y  Difficulty concentrating or making decisions? N N  Walking or climbing stairs? N N  Dressing or bathing? N N  Doing errands, shopping? N N  Preparing Food and eating ? N -  Using the Toilet? N -  In the past six months, have you accidently leaked urine? Y -  Comment wears pads for protection -  Do you have problems with loss of bowel control? N -  Managing your Medications? N -  Managing your Finances? N -  Housekeeping or managing your Housekeeping? N -  Some recent data might be hidden    Patient Care Team: Delsa Grana, PA-C as PCP - General (Family Medicine) Corey Skains, MD as Consulting Physician (Cardiology) Anell Barr, OD as Consulting Physician (Optometry) Dingeldein, Remo Lipps, MD as Consulting Physician (Ophthalmology) Albertine Patricia, DPM as Consulting Physician (Podiatry) Jannet Mantis, MD as Consulting Physician (Dermatology) Carloyn Manner, MD as Referring Physician (Otolaryngology)    Assessment:   This is a routine wellness examination for [place].  Exercise Activities and Dietary recommendations Current Exercise Habits: The  patient does not participate in  regular exercise at present, Exercise limited by: None identified  Goals    . DIET - INCREASE WATER INTAKE     Recommend to drink at least 6-8 8oz glasses of water per day.       Fall Risk Fall Risk  09/08/2019 12/26/2018 05/27/2018 05/20/2018 05/06/2018  Falls in the past year? 0 0 0 0 0  Number falls in past yr: 0 0 0 - 0  Injury with Fall? 0 0 0 - 0  Risk for fall due to : No Fall Risks;Impaired vision - - - -  Risk for fall due to: Comment - - - - -  Follow up Falls prevention discussed - - - -   FALL RISK PREVENTION PERTAINING TO THE HOME:  Any stairs in or around the home? Yes  If so, do they handrails? No - 2 steps outside  Home free of loose throw rugs in walkways, pet beds, electrical cords, etc? Yes  Adequate lighting in your home to reduce risk of falls? Yes   ASSISTIVE DEVICES UTILIZED TO PREVENT FALLS:  Life alert? No  Use of a cane, walker or w/c? No  Grab bars in the bathroom? No  Shower chair or bench in shower? No  Elevated toilet seat or a handicapped toilet? No    TIMED UP AND GO:  Was the test performed? Yes   Depression Screen PHQ 2/9 Scores 09/08/2019 12/26/2018 05/20/2018 05/06/2018  PHQ - 2 Score 0 0 0 0  PHQ- 9 Score - 0 0 0     Cognitive Function     6CIT Screen 09/08/2019 07/23/2017  What Year? 0 points 0 points  What month? 0 points 0 points  What time? 0 points 0 points  Count back from 20 0 points 0 points  Months in reverse 0 points 0 points  Repeat phrase 0 points 2 points  Total Score 0 2    Immunization History  Administered Date(s) Administered  . Fluad Quad(high Dose 65+) 12/26/2018  . Influenza, High Dose Seasonal PF 02/21/2017, 01/10/2018  . Influenza-Unspecified 01/20/2013  . PFIZER SARS-COV-2 Vaccination 05/13/2019, 06/03/2019  . Pneumococcal Conjugate-13 11/02/2013  . Pneumococcal Polysaccharide-23 03/30/2011, 11/02/2013  . Tdap 04/16/2010  . Zoster 06/14/2013    Qualifies for Shingles  Vaccine? Yes  Zostavax completed 2015. Due for Shingrix. Education has been provided regarding the importance of this vaccine. Pt has been advised to call insurance company to determine out of pocket expense. Advised may also receive vaccine at local pharmacy or Health Dept. Verbalized acceptance and understanding.  Tdap: Up to date  Flu Vaccine: Up to date  Pneumococcal Vaccine: Up to date  Covid-19 Vaccine: Up to date   Screening Tests Health Maintenance  Topic Date Due  . PNA vac Low Risk Adult (2 of 2 - PCV13) 11/03/2014  . MAMMOGRAM  06/27/2018  . INFLUENZA VACCINE  11/15/2019  . TETANUS/TDAP  04/16/2020  . COLONOSCOPY  11/28/2022  . DEXA SCAN  Completed  . COVID-19 Vaccine  Completed  . Hepatitis C Screening  Completed    Cancer Screenings:  Colorectal Screening: Completed 11/27/17.  No longer required.   Mammogram: Completed 06/26/17. Repeat every year. Ordered 12/26/18. Pt provided with contact information and advised to call to schedule appt.   Bone Density: Completed 01/30/17. Results reflect  OSTEOPENIA. Repeat every 2 years. Pt previously on fosamax, has been off for the past 3 months. Plans to discuss with PCP.   Lung Cancer Screening: (Low Dose CT Chest recommended if  Age 32-80 years, 30 pack-year currently smoking OR have quit w/in 15years.) does not qualify.   Additional Screening:  Hepatitis C Screening: does qualify; Completed 11/07/15  Vision Screening: Recommended annual ophthalmology exams for early detection of glaucoma and other disorders of the eye. Is the patient up to date with their annual eye exam?  Yes  Who is the provider or what is the name of the office in which the pt attends annual eye exams? [place] Screening: Recommended annual dental exams for proper oral hygiene  Community Resource Referral:  CRR required this visit?  No      Plan:     I have personally reviewed and addressed the Medicare Annual Wellness  questionnaire and have noted the following in the patient's chart:  A. Medical and social history B. Use of alcohol, tobacco or illicit drugs  C. Current medications and supplements D. Functional ability and status E.  Nutritional status F.  Physical activity G. Advance directives H. List of other physicians I.  Hospitalizations, surgeries, and ER visits in previous 12 months J.  [place] such as hearing and vision if needed, cognitive and depression L. Referrals and appointments   In addition, I have reviewed and discussed with patient certain preventive protocols, quality metrics, and best practice recommendations. A written personalized care plan for preventive services as well as general preventive health recommendations were provided to patient.   Signed,  Clemetine Marker, LPN Nurse Health Advisor   Nurse Notes: pt c/o burn on forehead approx one week ago that has turned into rash and spread to rash on both sides of her nose. Pt seen at [facility] 09/[facility]/19 and prescribed valtrex and metrogel (she has not started metrogel yet) and pt already had rx for doxycycline. Pt also c/o pain on lower left side of back and urine with odor and bright yellow color.  Pt also c/o eye lashes falling out frequently over the past 2 weeks.   Pt to see Dr. Roxan Hockey in office today.

## 2019-10-06 ENCOUNTER — Other Ambulatory Visit: Payer: Self-pay | Admitting: Family Medicine

## 2019-10-06 MED ORDER — MOMETASONE FUROATE 50 MCG/ACT NA SUSP: 2.0000 | Freq: Every day | NASAL | 1 refills | Status: DC

## 2019-10-06 NOTE — Telephone Encounter (Signed)
Refill Request for Mometasone  Preferred Pharmacy-[facility]

## 2019-12-11 ENCOUNTER — Other Ambulatory Visit: Payer: Self-pay | Admitting: Family Medicine

## 2020-01-11 ENCOUNTER — Other Ambulatory Visit: Payer: Self-pay | Admitting: Family Medicine

## 2020-02-14 ENCOUNTER — Other Ambulatory Visit: Payer: Self-pay | Admitting: Family Medicine

## 2020-02-15 NOTE — Telephone Encounter (Signed)
lvm informing pt that prescription has been sent to pharmacy, however that she would have to be seen in order to obtain additional refills

## 2020-02-15 NOTE — Telephone Encounter (Signed)
Spoke to patient and she stated as og end of November she will be out of medication

## 2020-03-03 ENCOUNTER — Other Ambulatory Visit: Payer: Self-pay | Admitting: Family Medicine

## 2020-03-03 MED ORDER — LEVOTHYROXINE SODIUM 75 MCG PO TABS: 75.0000 ug | ORAL_TABLET | Freq: Every day | ORAL | 2 refills | Status: DC

## 2020-03-03 NOTE — Telephone Encounter (Signed)
Medication Refill - Medication: Levothyroxine  Has the patient contacted their pharmacy? No. (Agent: If no, request that the patient contact the pharmacy for the refill.) (Agent: If yes, when and what did the pharmacy advise?)  Preferred Pharmacy (with phone number or street name): [id number] - [place], [address]  Agent: Please be advised that RX refills may take up to 3 business days. We ask that you follow-up with your pharmacy.

## 2020-04-06 ENCOUNTER — Other Ambulatory Visit: Payer: Self-pay | Admitting: Family Medicine

## 2020-05-25 ENCOUNTER — Ambulatory Visit: Payer: Self-pay | Admitting: Family Medicine

## 2020-05-25 NOTE — Telephone Encounter (Signed)
Pt reports H/O recurring shingles. States has been on Doxy 1 QD prophylactically for one year. States now with papules under right eye, one on forehead, between eyebrows and a few under  Left eye. States eyes itchy, rash is not itchy or painful. States "I know this is shingles again, it comes and goes but has been present for 3 days now." Pt declines appt. Questioning if she should increase her dose of Doxy and start Acyclovir as previously.  Advised may need appt; declines "I  Have no transportation." Assured NT would route to practice for PCPs review.   Please advise: CB# [phone] Reason for Disposition . Localized rash present > 7 days  Answer Assessment - Initial Assessment Questions 1. APPEARANCE of RASH: "Describe the rash."      Papules 2. LOCATION: "Where is the rash located?"      Under right eye and left forehead;between eyebrows, few under left eye. 3. NUMBER: "How many spots are there?"      "Too many to count" 4. SIZE: "How big are the spots?" (Inches, centimeters or compare to size of a coin)       5. ONSET: "When did the rash start?"      Comes and goes, worse last 3 days 6. ITCHING: "Does the rash itch?" If Yes, ask: "How bad is the itch?"  (Scale 1-10; or mild, moderate, severe)     Eyes itch, not rash 7. PAIN: "Does the rash hurt?" If Yes, ask: "How bad is the pain?"  (Scale 1-10; or mild, moderate, severe)    no 8. OTHER SYMPTOMS: "Do you have any other symptoms?" (e.g., fever)     no  Protocols used: RASH OR REDNESS - LOCALIZED-A-AH

## 2020-05-25 NOTE — Telephone Encounter (Signed)
Tried contacting pt but mb full. See Kristeen Miss message below

## 2020-05-25 NOTE — Telephone Encounter (Signed)
Recommend pt try [place] on demand virtual or e-visit? I cannot help her without an encounter (virtual with [place] ok), cannot dx or prescribe or advise on treatment without assessing her  Please call pt to advise her various ways to schedule thanks

## 2020-06-03 ENCOUNTER — Other Ambulatory Visit: Payer: Self-pay

## 2020-06-03 ENCOUNTER — Encounter: Payer: Self-pay | Admitting: Emergency Medicine

## 2020-06-03 ENCOUNTER — Emergency Department
Admission: EM | Admit: 2020-06-03 | Discharge: 2020-06-03 | Disposition: A | Discharge status: Home / Self Care | Payer: Medicare Other | Attending: Physician Assistant | Admitting: Physician Assistant

## 2020-06-03 DIAGNOSIS — Z79899 Other long term (current) drug therapy: Secondary | ICD-10-CM | POA: Diagnosis not present

## 2020-06-03 DIAGNOSIS — E039 Hypothyroidism, unspecified: Secondary | ICD-10-CM | POA: Insufficient documentation

## 2020-06-03 DIAGNOSIS — I10 Essential (primary) hypertension: Secondary | ICD-10-CM | POA: Diagnosis not present

## 2020-06-03 DIAGNOSIS — B029 Zoster without complications: Secondary | ICD-10-CM | POA: Insufficient documentation

## 2020-06-03 DIAGNOSIS — Z7982 Long term (current) use of aspirin: Secondary | ICD-10-CM | POA: Insufficient documentation

## 2020-06-03 DIAGNOSIS — R21 Rash and other nonspecific skin eruption: Secondary | ICD-10-CM | POA: Diagnosis not present

## 2020-06-03 MED ORDER — ACYCLOVIR 200 MG PO CAPS
800.0000 mg | ORAL_CAPSULE | Freq: Once | ORAL | Status: AC
  Administered 2020-06-03: 800 mg via ORAL
  Filled 2020-06-03: qty 4

## 2020-06-03 MED ORDER — ACYCLOVIR 800 MG PO TABS: 800.0000 mg | ORAL_TABLET | Freq: Every day | ORAL | 1 refills | Status: AC

## 2020-06-03 MED ORDER — PREDNISONE 10 MG (21) PO TBPK
ORAL_TABLET | ORAL | 0 refills | Status: DC
Start: 2020-06-03 — End: 2020-06-13

## 2020-06-03 MED ORDER — FLUORESCEIN SODIUM 1 MG OP STRP
1.0000 | ORAL_STRIP | Freq: Once | OPHTHALMIC | Status: AC
  Administered 2020-06-03: 1 via OPHTHALMIC
  Filled 2020-06-03: qty 1

## 2020-06-03 NOTE — Discharge Instructions (Signed)
Take the prescription medications as prescribed your shingles rash. Follow-up with your newly selected primary care provider. See [facility] for routine eye care. Return to the ED if needed.

## 2020-06-03 NOTE — ED Provider Notes (Signed)
[facility] Emergency Department Provider Note ____________________________________________  Time seen: 1537  I have reviewed the triage vital signs and the nursing notes.  HISTORY  Chief Complaint  Rash  HPI Angela Kelly is a 75 y.o. female presents herself to the ED for evaluation of a 1 week complaint of a single sided facial rash.  Patient with a history of recurrent zoster in the same distribution, presents with concern of a possible recurrence.  She describes blistery rash has been present for about a week.  She started on a refill of doxycycline that she had on her counter from her primary provider.  She presented today for concern over the rash spreading towards her eye.  She denies any visual loss, but does report some mild visual blurriness.  She does admit however, that she is overdue for her routine eye exam.  Patient denies any interim fever, chills, sweats Posey Pronto denies any visual discharge.   Past Medical History:  Diagnosis Date  . Allergic rhinitis   . Allergy   . Anemia   . Arthritis   . Bilateral ovarian cysts    laterality not known  . Bunion   . Cataract of both eyes   . Complication of anesthesia    Bridge x 2 perm., crowns front   . Deviated septum   . Difficult intubation   . Elevated BP   . Environmental and seasonal allergies   . Essential hypertension   . Fibrocystic breast   . GERD (gastroesophageal reflux disease)   . Hammer toe   . History of closed fracture of nasal bones   . History of pneumonia    in her 27's  . Hyperlipidemia   . IFG (impaired fasting glucose)   . Ocular rosacea    see's [facility]  . OSA (obstructive sleep apnea)   . Osteoarthritis cervical spine   . Osteoarthritis of both hands   . Osteoporosis   . Osteoporosis   . Rosacea    telengiectatic  . Shingles    left eye  . Sleep apnea   . Thyroid disease   . Thyroid nodule    left, sees Endocrinology  . Vitamin D deficiency disease      Patient Active Problem List   Diagnosis Date Noted  . Gallstones 05/15/2018  . Aortic atherosclerosis ([place]) 05/06/2018  . Thoracogenic scoliosis of thoracic region 10/16/2017  . Gall bladder polyp 09/17/2017  . Essential hypertension 07/31/2017  . GERD (gastroesophageal reflux disease) 07/31/2017  . Full code status 07/31/2017  . Myalgia due to HMG CoA reductase inhibitor 07/31/2017  . Abnormal mammogram of left breast 01/18/2015  . OSA (obstructive sleep apnea)   . Hyperlipidemia   . Thyroid disease   . Cataract of both eyes   . Ocular rosacea   . Bunion   . Hammer toe   . Allergic rhinitis   . Hypothyroidism 08/03/2014  . Left thyroid nodule 08/03/2014  . IFG (impaired fasting glucose) 08/03/2014  . Osteopenia 08/03/2014  . SOB (shortness of breath) 11/05/2013  . Anemia, unspecified 08/21/2013  . Depression 08/21/2013  . Osteoarthritis 08/21/2013  . Osteoporosis 08/21/2013    Past Surgical History:  Procedure Laterality Date  . BACK SURGERY     lumbar laminectomy  . BIOPSY THYROID    . BREAST CYST ASPIRATION Left   . BREAST SURGERY    . BUNIONECTOMY Right   . COLONOSCOPY WITH PROPOFOL N/A 11/27/2017   Procedure: COLONOSCOPY WITH PROPOFOL;  Surgeon:  Virgel Manifold, MD;  Location: [facility] ENDOSCOPY;  Service: Endoscopy;  Laterality: N/A;  . EYE SURGERY    . SPINE SURGERY      Prior to Admission medications   Medication Sig Start Date End Date Taking? Authorizing Provider  acyclovir (ZOVIRAX) 800 MG tablet Take 1 tablet (800 mg total) by mouth 5 (five) times daily for 7 days. 06/03/20 06/10/20 Yes Genee Rann, Dannielle Karvonen, PA-C  predniSONE (STERAPRED UNI-PAK 21 TAB) 10 MG (21) TBPK tablet 6-day taper as directed. 06/03/20  Yes Lorimer Tiberio, Dannielle Karvonen, PA-C  acetaminophen (TYLENOL) 500 MG tablet Take 500 mg by mouth every 6 (six) hours as needed.    [provider]  alendronate (FOSAMAX) 70 MG tablet Take 1 tablet (70 mg total) by mouth every Friday. Take  with a full glass of water on an empty stomach. Patient not taking: Reported on 12/26/2018 11/07/18   Fredderick Severance, NP  aspirin EC 81 MG tablet Take 1 tablet (81 mg total) by mouth daily. Patient taking differently: Take 81 mg by mouth at bedtime.  05/03/15   Arnetha Courser, MD  calcium carbonate (TUMS - DOSED IN MG ELEMENTAL CALCIUM) 500 MG chewable tablet Chew 1-3 tablets by mouth daily as needed for indigestion or heartburn.    [provider]  diphenhydrAMINE (BENADRYL) 25 mg capsule Take 25 mg by mouth See admin instructions. Take 25 mg every night and may take an additional 25 mg dose during the day as needed for allergies    [provider]  doxycycline (VIBRAMYCIN) 50 MG capsule Take 50 mg by mouth daily.  10/06/15   [provider]  hydrocortisone cream 1 % Apply 1 application topically 2 (two) times daily.    [provider]  levothyroxine (SYNTHROID) 75 MCG tablet Take 1 tablet (75 mcg total) by mouth daily before breakfast. 04/06/20   Delsa Grana, PA-C  metroNIDAZOLE (METROGEL) 1 % gel Apply 1 application topically daily. 09/06/19   [provider]  mometasone (NASONEX) 50 MCG/ACT nasal spray PLACE 2 SPRAYS INTO THE NOSE DAILY. 01/11/20   Delsa Grana, PA-C  PAZEO 0.7 % SOLN Place 1 drop into both eyes at bedtime.  07/09/14   [provider]  Probiotic Product (PROBIOTIC DAILY PO) Take 1 capsule by mouth daily as needed (when taking antibiotics).     [provider]  valACYclovir (VALTREX) 1000 MG tablet Take 1,000 mg by mouth 3 (three) times daily. 09/04/19   [provider]    Allergies Nickel, Codeine, Lovastatin, and Tape  Family History  Problem Relation Age of Onset  . Asthma Mother   . Hypertension Mother   . Diabetes Mother   . Cancer Mother        Leukemia  . Peripheral vascular disease Mother   . Osteoporosis Mother   . Glaucoma Mother   . Arthritis Mother   . Varicose Veins Mother   . Heart  disease Father        coronary artery occlusion  . Early death Father   . Heart attack Father   . Hypertension Sister   . Diabetes Sister   . Glaucoma Sister   . Asthma Sister   . Hearing loss Sister   . Kidney disease Sister   . Vision loss Sister   . Varicose Veins Sister   . Diabetes Maternal Grandmother   . Heart disease Maternal Grandmother   . Colon cancer Maternal Uncle   . Lung cancer Paternal Uncle   .  Early death Paternal Uncle   . Diabetes Maternal Grandfather     Social History Social History   Tobacco Use  . Smoking status: Never Smoker  . Smokeless tobacco: Never Used  . Tobacco comment: smoking cesaation materials not required  Vaping Use  . Vaping Use: Never used  Substance Use Topics  . Alcohol use: Yes    Alcohol/week: 0.0 standard drinks    Comment: Wine (very rarely).  . Drug use: No    Review of Systems  Constitutional: Negative for fever. Eyes: Negative for blurry vision. ENT: Negative for sore throat. Cardiovascular: Negative for chest pain. Respiratory: Negative for shortness of breath. Gastrointestinal: Negative for abdominal pain, vomiting and diarrhea. Genitourinary: Negative for dysuria. Musculoskeletal: Negative for back pain. Skin: Positive for rash. Neurological: Negative for headaches, focal weakness or numbness. ____________________________________________  PHYSICAL EXAM:  VITAL SIGNS: ED Triage Vitals  Enc Vitals Group     BP 06/03/20 1413 125/69     Pulse Rate 06/03/20 1413 95     Resp 06/03/20 1413 16     Temp 06/03/20 1413 98.3 F (36.8 C)     Temp Source 06/03/20 1413 Oral     SpO2 06/03/20 1413 100 %     Weight 06/03/20 1412 115 lb (52.2 kg)     Height 06/03/20 1412 4\' 11"  (1.499 m)     Head Circumference --      Peak Flow --      Pain Score 06/03/20 1411 0     Pain Loc --      Pain Edu? --      Excl. in [place]? --     Constitutional: Alert and oriented. Well appearing and in no distress. Head: Normocephalic  and atraumatic.   Eyes: Conjunctivae are normal. PERRL. Normal extraocular movements and fundi bilaterally. No fluorescein dye uptake or dendritic lesions.   Nose: No congestion/rhinorrhea/epistaxis. Mouth/Throat: Mucous membranes are moist. Neck: Supple. No thyromegaly. Hematological/Lymphatic/Immunological: No cervical lymphadenopathy. Cardiovascular: Normal rate, regular rhythm. Normal distal pulses. Respiratory: Normal respiratory effort. No wheezes/rales/rhonchi. Musculoskeletal: Nontender with normal range of motion in all extremities.  Neurologic:  Normal gait without ataxia. Normal speech and language. No gross focal neurologic deficits are appreciated. Skin:  Skin is warm, dry and intact.  Patient with a erythematous circular rash noted to the left side of the forehead and left nasolabial fold consistent with a likely unilateral ophthalmic branch exacerbation of herpes zoster. Psychiatric: Mood and affect are normal. Patient exhibits appropriate insight and judgment. ____________________________________________  PROCEDURES  Acyclovir 8000 mg PO Procedures ____________________________________________  INITIAL IMPRESSION / ASSESSMENT AND PLAN / ED COURSE  DDX: contact dermatitis, zoster, eczema  Patient with ED evaluation of a single dermatomal vesicular rash concerning for likely shingles zoster exacerbation.  Patient with ED evaluation due to concern of the rash encroaching upon her left eye.  Eye exam is not concerning for any dendritic lesions or corneal enlargement.  She will be treated with antivirals in the form of acyclovir and anti-inflammatories in the form of prednisone.  She is encouraged to follow-up with primary provider return to the ED if needed.   JANIT CUTTER was evaluated in Emergency Department on 06/03/2020 for the symptoms described in the history of present illness. She was evaluated in the context of the global COVID-19 pandemic, which necessitated  consideration that the patient might be at risk for infection with the SARS-CoV-2 virus that causes COVID-19. Institutional protocols and algorithms that pertain to the evaluation of patients at  risk for COVID-19 are in a state of rapid change based on information released by regulatory bodies including the CDC and federal and state organizations. These policies and algorithms were followed during the patient's care in the ED. ____________________________________________  FINAL CLINICAL IMPRESSION(S) / ED DIAGNOSES  Final diagnoses:  Herpes zoster without complication      Carmie End, Dannielle Karvonen, PA-C 06/03/20 1737    Naaman Plummer, MD 06/03/20 2001

## 2020-06-03 NOTE — ED Triage Notes (Signed)
Pt to ED via POV c/o rash on her forehead and nose. Pt states that she has recurrent shingles and thinks that this is what her rash is from. Pt states that she is concerned about it going into her eye. Pt is in NAD.

## 2020-06-10 ENCOUNTER — Telehealth: Payer: Self-pay

## 2020-06-10 NOTE — Telephone Encounter (Signed)
Pt called and states that Francene Boyers told her that Dr Derrel Nip would take her on as a new pt. She states that she needs to be seen right away due to just getting over shingles. Please advise

## 2020-06-10 NOTE — Telephone Encounter (Signed)
Did you agree to take this patient?

## 2020-06-10 NOTE — Telephone Encounter (Signed)
I agreed to take her as a new patient but I was not told she had to br seen right away. I will not move any patients to see her so if she is comfortable with next available you can set her up

## 2020-06-13 ENCOUNTER — Encounter: Payer: Self-pay | Admitting: Family Medicine

## 2020-06-13 ENCOUNTER — Ambulatory Visit (INDEPENDENT_AMBULATORY_CARE_PROVIDER_SITE_OTHER): Payer: Medicare Other | Admitting: Family Medicine

## 2020-06-13 DIAGNOSIS — R21 Rash and other nonspecific skin eruption: Secondary | ICD-10-CM | POA: Diagnosis not present

## 2020-06-13 MED ORDER — ACYCLOVIR 800 MG PO TABS: 800.0000 mg | ORAL_TABLET | Freq: Every day | ORAL | 0 refills | Status: AC

## 2020-06-13 MED ORDER — PREDNISONE 5 MG PO TBEC: DELAYED_RELEASE_TABLET | ORAL | 0 refills | Status: DC

## 2020-06-13 NOTE — Telephone Encounter (Signed)
Please schedule her for a new pt appt. Will have to be next available. If acute issues, needs to be seen.

## 2020-06-13 NOTE — Patient Instructions (Addendum)
It was great to see you!  Our plans for today:  - We are repeating your antiviral and steroid course given concern for spreading of shingles.  - Make an appointment to be evaluated in person within the next few days. - Make an appointment urgently with your eye doctor for an eye exam. If you need a referral for this, let us know.   Take care and seek immediate care sooner if you develop any concerns.   Dr. Ky Barban

## 2020-06-13 NOTE — Progress Notes (Signed)
Virtual Visit via Video Note  I connected with Angela Kelly on 06/13/20 at  4:00 PM EST by a video enabled telemedicine application and verified that I am speaking with the correct person using two identifiers.  Location: Patient: home Provider: [facility]   I discussed the limitations of evaluation and management by telemedicine and the availability of in person appointments. The patient expressed understanding and agreed to proceed.  History of Present Illness:  ER FOLLOW UP Time since discharge: 10 days Hospital/facility: [facility] ED Diagnosis: shingles Procedures/tests:  - fluorescein dye exam nl Consultants: none New medications:  -  Acyclovir x7 days, prednisone taper x6 days. Discharge instructions:  F/u with PCP Status: initially improved after acyclovir and prednisone but then worsened once completed course. - now located on bridge of nose and bilateral eyebrows.  - previously with prior shingles infection and rash appears similar though not painful or itchy. - blistering.  - no fevers - has eye doctor but doesn't have upcoming appt.  Observations/Objective:  Patient had trouble connecting to video visit, entirety of visit conducted over the phone.  Speaks in full sentences, no respiratory distress.   Assessment and Plan:  Rash Given inability to examine due to phone visit, recommended in-person appointment for evaluation. Will repeat antiviral and steroid course given concern for shingles in ED with new rash and spreading with concern for future ophthalmic involvement. Will also have pt make eye appointment urgently.    I discussed the assessment and treatment plan with the patient. The patient was provided an opportunity to ask questions and all were answered. The patient agreed with the plan and demonstrated an understanding of the instructions.   The patient was advised to call back or seek an in-person evaluation if the symptoms worsen or if the condition fails to  improve as anticipated.  I provided 7 minutes of non-face-to-face time during this encounter.   Myles Gip, DO

## 2020-06-14 ENCOUNTER — Other Ambulatory Visit: Payer: Self-pay | Admitting: Family Medicine

## 2020-06-14 ENCOUNTER — Telehealth: Payer: Self-pay | Admitting: Internal Medicine

## 2020-06-14 NOTE — Telephone Encounter (Signed)
Tried to call patient to set up new patient appointment with Dr. Derrel Nip. Patient's voice box was full. If patient calls back, Dr. Derrel Nip will take as a new patient, schedule end of March or April.

## 2020-07-04 ENCOUNTER — Encounter: Payer: Self-pay | Admitting: Family Medicine

## 2020-07-04 ENCOUNTER — Ambulatory Visit (INDEPENDENT_AMBULATORY_CARE_PROVIDER_SITE_OTHER): Payer: Medicare Other | Admitting: Family Medicine

## 2020-07-04 ENCOUNTER — Other Ambulatory Visit: Payer: Self-pay

## 2020-07-04 VITALS — BP 126/78 | HR 68 | Temp 98.1°F | Resp 16 | Ht <= 58 in | Wt 111.4 lb

## 2020-07-04 DIAGNOSIS — M810 Age-related osteoporosis without current pathological fracture: Secondary | ICD-10-CM

## 2020-07-04 DIAGNOSIS — I700 Atherosclerosis of aorta: Secondary | ICD-10-CM

## 2020-07-04 DIAGNOSIS — Z1231 Encounter for screening mammogram for malignant neoplasm of breast: Secondary | ICD-10-CM

## 2020-07-04 DIAGNOSIS — E782 Mixed hyperlipidemia: Secondary | ICD-10-CM

## 2020-07-04 DIAGNOSIS — R21 Rash and other nonspecific skin eruption: Secondary | ICD-10-CM | POA: Diagnosis not present

## 2020-07-04 DIAGNOSIS — R7301 Impaired fasting glucose: Secondary | ICD-10-CM

## 2020-07-04 DIAGNOSIS — E039 Hypothyroidism, unspecified: Secondary | ICD-10-CM | POA: Diagnosis not present

## 2020-07-04 DIAGNOSIS — M85859 Other specified disorders of bone density and structure, unspecified thigh: Secondary | ICD-10-CM

## 2020-07-04 DIAGNOSIS — T466X5A Adverse effect of antihyperlipidemic and antiarteriosclerotic drugs, initial encounter: Secondary | ICD-10-CM | POA: Diagnosis not present

## 2020-07-04 DIAGNOSIS — Z5181 Encounter for therapeutic drug level monitoring: Secondary | ICD-10-CM | POA: Diagnosis not present

## 2020-07-04 DIAGNOSIS — M791 Myalgia, unspecified site: Secondary | ICD-10-CM

## 2020-07-04 NOTE — Progress Notes (Signed)
Name: Angela Kelly   MRN: [id number]    DOB: [birth year]   Date:07/04/2020       Progress Note  Chief Complaint  Patient presents with  . Follow-up  . Hypothyroidism     Subjective:   Angela Kelly is a 75 y.o. female, presents to clinic for routine f/up and med refill She is switching PCP to another office soon, just needs meds refilled- no labs done since 2020  Hypothyroidism: Levothyroxine 75 mcg dose daily in the am prior to eating or any other meds Current Symptoms: denies fatigue, weight changes, heat/cold intolerance, bowel/skin changes or CVS symptoms Most recent results are below; we will be repeating labs today. Lab Results  Component Value Date   TSH 0.53 12/26/2018    Hyperlipidemia: Not on meds, did not respond to result note previously asking if she was willing to try or had previously tried Last Lipids: Lab Results  Component Value Date   CHOL 201 (H) 12/26/2018   HDL 47 (L) 12/26/2018   LDLCALC 122 (H) 12/26/2018   TRIG 207 (H) 12/26/2018   CHOLHDL 4.3 12/26/2018   - Denies: Chest pain, shortness of breath, myalgias, claudication The 10-year ASCVD risk score Mikey Bussing DC Jr., et al., 2013) is: 20.4%   Values used to calculate the score:     Age: 35 years     Sex: Female     Is Non-Hispanic African American: No     Diabetic: No     Tobacco smoker: No     Systolic Blood Pressure: 024 mmHg     Is BP treated: No     HDL Cholesterol: 47 mg/dL     Total Cholesterol: 201 mg/dL  Hx of prediabetes - mild in 2016-2017 then normal since 7/20217 Lab Results  Component Value Date   HGBA1C 5.4 12/26/2018   HGBA1C 5.6 05/06/2018   HGBA1C 5.4 02/21/2017        Current Outpatient Medications:  .  acetaminophen (TYLENOL) 500 MG tablet, Take 500 mg by mouth every 6 (six) hours as needed., Disp: , Rfl:  .  alendronate (FOSAMAX) 70 MG tablet, Take 1 tablet (70 mg total) by mouth every Friday. Take with a full glass of water on an empty stomach., Disp: 12  tablet, Rfl: 0 .  aspirin EC 81 MG tablet, Take 1 tablet (81 mg total) by mouth daily. (Patient taking differently: Take 81 mg by mouth at bedtime.), Disp: 30 tablet, Rfl: 11 .  calcium carbonate (TUMS - DOSED IN MG ELEMENTAL CALCIUM) 500 MG chewable tablet, Chew 1-3 tablets by mouth daily as needed for indigestion or heartburn., Disp: , Rfl:  .  ciclesonide (OMNARIS) 50 MCG/ACT nasal spray, Place into the nose., Disp: , Rfl:  .  diphenhydrAMINE (BENADRYL) 25 mg capsule, Take 25 mg by mouth See admin instructions. Take 25 mg every night and may take an additional 25 mg dose during the day as needed for allergies, Disp: , Rfl:  .  doxycycline (VIBRAMYCIN) 50 MG capsule, Take 50 mg by mouth daily. , Disp: , Rfl: 3 .  hydrocortisone cream 1 %, Apply 1 application topically 2 (two) times daily., Disp: , Rfl:  .  levothyroxine (SYNTHROID) 75 MCG tablet, Take 1 tablet (75 mcg total) by mouth daily before breakfast., Disp: 90 tablet, Rfl: 0 .  metroNIDAZOLE (METROGEL) 1 % gel, Apply 1 application topically daily., Disp: , Rfl:  .  mometasone (NASONEX) 50 MCG/ACT nasal spray, PLACE 2 SPRAYS INTO THE NOSE DAILY.,  Disp: 51 each, Rfl: 1 .  Multiple Vitamin (MULTIVITAMIN) capsule, Take by mouth., Disp: , Rfl:  .  PAZEO 0.7 % SOLN, Place 1 drop into both eyes at bedtime. , Disp: , Rfl: 5 .  predniSONE 5 MG TBEC, Day 1: Take 10 mg (2 tablets) at breakfast, 5 mg (1 tablet) at lunch, 5 mg (1 tablet) at dinner, and 10 mg (2 tablets) at bedtime. Day 2: Take 5 mg (1 tablet) at breakfast, 5 mg (1 tablet) at lunch, 5 mg (1 tablet) at dinner, and 10 mg (2 tablets) at bedtime. Day 3: Take 5 mg (1 tablet) at breakfast, 5 mg (1 tablet) at lunch, 5 mg (1 tablet) at dinner, and 5 mg (1 tablet) at bedtime. Day 4: Take 5 mg (1 tablet) at breakfast, 5 mg (1 tablet) at lunch, and 5 mg (1 tablet) at bedtime. Day 5: Take 5 mg (1 tablet) at breakfast and 5 mg (1 tablet) at bedtime. Day 6: Take 5 mg (1 tablet) at breakfast., Disp: 30  tablet, Rfl: 0 .  Probiotic Product (PROBIOTIC DAILY PO), Take 1 capsule by mouth daily as needed (when taking antibiotics). , Disp: , Rfl:   Patient Active Problem List   Diagnosis Date Noted  . Gallstones 05/15/2018  . Aortic atherosclerosis ([place]) 05/06/2018  . Thoracogenic scoliosis of thoracic region 10/16/2017  . Gall bladder polyp 09/17/2017  . Essential hypertension 07/31/2017  . GERD (gastroesophageal reflux disease) 07/31/2017  . Full code status 07/31/2017  . Myalgia due to HMG CoA reductase inhibitor 07/31/2017  . Abnormal mammogram of left breast 01/18/2015  . OSA (obstructive sleep apnea)   . Hyperlipidemia   . Thyroid disease   . Cataract of both eyes   . Ocular rosacea   . Bunion   . Hammer toe   . Allergic rhinitis   . Hypothyroidism 08/03/2014  . Left thyroid nodule 08/03/2014  . IFG (impaired fasting glucose) 08/03/2014  . Osteopenia 08/03/2014  . SOB (shortness of breath) 11/05/2013  . Anemia, unspecified 08/21/2013  . Depression 08/21/2013  . Osteoarthritis 08/21/2013  . Osteoporosis 08/21/2013    Past Surgical History:  Procedure Laterality Date  . BACK SURGERY     lumbar laminectomy  . BIOPSY THYROID    . BREAST CYST ASPIRATION Left   . BREAST SURGERY    . BUNIONECTOMY Right   . COLONOSCOPY WITH PROPOFOL N/A 11/27/2017   Procedure: COLONOSCOPY WITH PROPOFOL;  Surgeon: Virgel Manifold, MD;  Location: [facility] ENDOSCOPY;  Service: Endoscopy;  Laterality: N/A;  . EYE SURGERY    . SPINE SURGERY      Family History  Problem Relation Age of Onset  . Asthma Mother   . Hypertension Mother   . Diabetes Mother   . Cancer Mother        Leukemia  . Peripheral vascular disease Mother   . Osteoporosis Mother   . Glaucoma Mother   . Arthritis Mother   . Varicose Veins Mother   . Heart disease Father        coronary artery occlusion  . Early death Father   . Heart attack Father   . Hypertension Sister   . Diabetes Sister   . Glaucoma Sister   .  Asthma Sister   . Hearing loss Sister   . Kidney disease Sister   . Vision loss Sister   . Varicose Veins Sister   . Diabetes Maternal Grandmother   . Heart disease Maternal Grandmother   . Colon cancer  Maternal Uncle   . Lung cancer Paternal Uncle   . Early death Paternal Uncle   . Diabetes Maternal Grandfather     Social History   Tobacco Use  . Smoking status: Never Smoker  . Smokeless tobacco: Never Used  . Tobacco comment: smoking cesaation materials not required  Vaping Use  . Vaping Use: Never used  Substance Use Topics  . Alcohol use: Yes    Alcohol/week: 0.0 standard drinks    Comment: Wine (very rarely).  . Drug use: No     Allergies  Allergen Reactions  . Nickel Rash    Almost went blind when wearing nickel eye glasses  . Codeine Other (See Comments)    Ears ringing  . Lovastatin Rash  . Tape Rash    Health Maintenance  Topic Date Due  . PNA vac Low Risk Adult (2 of 2 - PCV13) 11/03/2014  . MAMMOGRAM  06/27/2018  . INFLUENZA VACCINE  11/15/2019  . TETANUS/TDAP  04/16/2020  . COLONOSCOPY (Pts 45-68yrs Insurance coverage will need to be confirmed)  11/28/2022  . DEXA SCAN  Completed  . COVID-19 Vaccine  Completed  . Hepatitis C Screening  Completed  . HPV VACCINES  Aged Out    Chart Review Today: I personally reviewed active problem list, medication list, allergies, family history, social history, health maintenance, notes from last encounter, lab results, imaging with the patient/caregiver today.   Review of Systems  Constitutional: Negative.   HENT: Negative.   Eyes: Negative.   Respiratory: Negative.   Cardiovascular: Negative.   Gastrointestinal: Negative.   Endocrine: Negative.   Genitourinary: Negative.   Musculoskeletal: Negative.   Skin: Negative.   Allergic/Immunologic: Negative.   Neurological: Negative.   Hematological: Negative.   Psychiatric/Behavioral: Negative.   All other systems reviewed and are negative.    Objective:    Vitals:   07/04/20 1439  BP: 126/78  Pulse: 68  Resp: 16  Temp: 98.1 F (36.7 C)  SpO2: 99%  Weight: 111 lb 6.4 oz (50.5 kg)  Height: 4\' 10"  (1.473 m)    Body mass index is 23.28 kg/m.  Physical Exam Vitals and nursing note reviewed.  Constitutional:      General: She is not in acute distress.    Appearance: Normal appearance. She is well-developed. She is not ill-appearing, toxic-appearing or diaphoretic.     Interventions: Face mask in place.  HENT:     Head: Normocephalic and atraumatic.     Right Ear: External ear normal.     Left Ear: External ear normal.  Eyes:     General: Lids are normal. No scleral icterus.       Right eye: No discharge.        Left eye: No discharge.     Conjunctiva/sclera: Conjunctivae normal.  Neck:     Trachea: Phonation normal. No tracheal deviation.  Cardiovascular:     Rate and Rhythm: Normal rate and regular rhythm.     Pulses: Normal pulses.          Radial pulses are 2+ on the right side and 2+ on the left side.       Posterior tibial pulses are 2+ on the right side and 2+ on the left side.     Heart sounds: Normal heart sounds. No murmur heard. No friction rub. No gallop.   Pulmonary:     Effort: Pulmonary effort is normal. No respiratory distress.     Breath sounds: Normal breath sounds. No stridor.  No wheezing, rhonchi or rales.  Chest:     Chest wall: No tenderness.  Abdominal:     General: Bowel sounds are normal. There is no distension.     Palpations: Abdomen is soft.  Musculoskeletal:     Right lower leg: No edema.     Left lower leg: No edema.  Skin:    General: Skin is warm and dry.     Coloration: Skin is not jaundiced or pale.     Findings: No rash.  Neurological:     Mental Status: She is alert.     Motor: No abnormal muscle tone.     Gait: Gait normal.  Psychiatric:        Mood and Affect: Mood normal.        Speech: Speech normal.        Behavior: Behavior normal.         Assessment & Plan:      ICD-10-CM   1. Hypothyroidism, unspecified type  E03.9 CBC with Differential/Platelet    COMPLETE METABOLIC PANEL WITH GFR    TSH    levothyroxine (SYNTHROID) 75 MCG tablet  2. Aortic atherosclerosis ([facility])  D98.3 COMPLETE METABOLIC PANEL WITH GFR    Lipid panel  3. Osteopenia of neck of femur, unspecified laterality  M85.859    did fosamax in the past, completed, right now supplementing  4. IFG (impaired fasting glucose)  R73.01 Lipid panel    Hemoglobin A1c   in 2016 and 2017 - improved to A1C in normal range since 10/2015 with diet/lifestyle efforts  5. Mixed hyperlipidemia  J82.5 COMPLETE METABOLIC PANEL WITH GFR    Lipid panel   tried lovastatin in the past had rash- which she is prone too  6. Myalgia due to HMG CoA reductase inhibitor  M79.10    T46.6X5A   7. Osteoporosis, unspecified osteoporosis type, unspecified pathological fracture presence  K53.9 COMPLETE METABOLIC PANEL WITH GFR    VITAMIN D 25 Hydroxy (Vit-D Deficiency, Fractures)  8. Encounter for screening mammogram for malignant neoplasm of breast  Z12.31 MM 3D SCREEN BREAST BILATERAL  9. Rash  R21 Ambulatory referral to Allergy    Ambulatory referral to Dermatology    CBC with Differential/Platelet   recurrent, nickle allergy, no past allergist consult  10. Encounter for medication monitoring  Z51.81 CBC with Differential/Platelet    COMPLETE METABOLIC PANEL WITH GFR    Lipid panel    Hemoglobin A1c    TSH    VITAMIN D 25 Hydroxy (Vit-D Deficiency, Fractures)      Return in about 6 months (around 01/04/2021).   Delsa Grana, PA-C 07/04/20 2:20 PM

## 2020-07-05 LAB — COMPLETE METABOLIC PANEL WITH GFR
AG Ratio: 1.5 (calc) (ref 1.0–2.5)
ALT: 13 U/L (ref 6–29)
AST: 17 U/L (ref 10–35)
Albumin: 3.9 g/dL (ref 3.6–5.1)
Alkaline phosphatase (APISO): 88 U/L (ref 37–153)
BUN: 11 mg/dL (ref 7–25)
CO2: 25 mmol/L (ref 20–32)
Calcium: 9.4 mg/dL (ref 8.6–10.4)
Chloride: 105 mmol/L (ref 98–110)
Creat: 0.76 mg/dL (ref 0.60–0.93)
GFR, Est African American: 88 mL/min/{1.73_m2} (ref >=60)
GFR, Est Non African American: 76 mL/min/{1.73_m2} (ref >=60)
Globulin: 2.6 g/dL (calc) (ref 1.9–3.7)
Glucose, Bld: 86 mg/dL (ref 65–139)
Potassium: 4.8 mmol/L (ref 3.5–5.3)
Sodium: 141 mmol/L (ref 135–146)
Total Bilirubin: 0.3 mg/dL (ref 0.2–1.2)
Total Protein: 6.5 g/dL (ref 6.1–8.1)

## 2020-07-05 LAB — CBC WITH DIFFERENTIAL/PLATELET
Absolute Monocytes: 478 cells/uL (ref 200–950)
Basophils Absolute: 32 cells/uL (ref 0–200)
Basophils Relative: 0.4 %
Eosinophils Absolute: 97 cells/uL (ref 15–500)
Eosinophils Relative: 1.2 %
HCT: 45.6 % — ABNORMAL HIGH (ref 35.0–45.0)
Hemoglobin: 15.2 g/dL (ref 11.7–15.5)
Lymphs Abs: 1345 cells/uL (ref 850–3900)
MCH: 29.1 pg (ref 27.0–33.0)
MCHC: 33.3 g/dL (ref 32.0–36.0)
MCV: 87.4 fL (ref 80.0–100.0)
MPV: 10 fL (ref 7.5–12.5)
Monocytes Relative: 5.9 %
Neutro Abs: 6148 cells/uL (ref 1500–7800)
Neutrophils Relative %: 75.9 %
Platelets: 394 10*3/uL (ref 140–400)
RBC: 5.22 10*6/uL — ABNORMAL HIGH (ref 3.80–5.10)
RDW: 14.2 % (ref 11.0–15.0)
Total Lymphocyte: 16.6 %
WBC: 8.1 10*3/uL (ref 3.8–10.8)

## 2020-07-05 LAB — VITAMIN D 25 HYDROXY (VIT D DEFICIENCY, FRACTURES): Vit D, 25-Hydroxy: 26 ng/mL — ABNORMAL LOW (ref 30–100)

## 2020-07-05 LAB — LIPID PANEL
Cholesterol: 252 mg/dL — ABNORMAL HIGH (ref <200)
HDL: 54 mg/dL (ref >=50)
LDL Cholesterol (Calc): 169 mg/dL (calc) — ABNORMAL HIGH
Non-HDL Cholesterol (Calc): 198 mg/dL (calc) — ABNORMAL HIGH (ref <130)
Total CHOL/HDL Ratio: 4.7 (calc) (ref <5.0)
Triglycerides: 145 mg/dL (ref <150)

## 2020-07-05 LAB — HEMOGLOBIN A1C
Hgb A1c MFr Bld: 5.4 % of total Hgb (ref <5.7)
Mean Plasma Glucose: 108 mg/dL
eAG (mmol/L): 6 mmol/L

## 2020-07-05 LAB — TSH: TSH: 0.6 mIU/L (ref 0.40–4.50)

## 2020-07-06 ENCOUNTER — Ambulatory Visit: Payer: Medicare Other | Admitting: Internal Medicine

## 2020-07-14 ENCOUNTER — Encounter: Payer: Self-pay | Admitting: Family Medicine

## 2020-07-14 MED ORDER — LEVOTHYROXINE SODIUM 75 MCG PO TABS: 75.0000 ug | ORAL_TABLET | Freq: Every day | ORAL | 3 refills | Status: DC

## 2020-08-15 DIAGNOSIS — L239 Allergic contact dermatitis, unspecified cause: Secondary | ICD-10-CM | POA: Diagnosis not present

## 2020-09-08 ENCOUNTER — Ambulatory Visit (INDEPENDENT_AMBULATORY_CARE_PROVIDER_SITE_OTHER): Payer: Medicare Other

## 2020-09-08 ENCOUNTER — Other Ambulatory Visit: Payer: Self-pay

## 2020-09-08 VITALS — BP 140/80 | HR 91 | Temp 98.2°F | Resp 16 | Ht <= 58 in | Wt 110.2 lb

## 2020-09-08 DIAGNOSIS — Z0000 Encounter for general adult medical examination without abnormal findings: Secondary | ICD-10-CM

## 2020-09-08 DIAGNOSIS — Z780 Asymptomatic menopausal state: Secondary | ICD-10-CM

## 2020-09-08 NOTE — Patient Instructions (Signed)
Angela Kelly , Thank you for taking time to come for your Medicare Wellness Visit. I appreciate your ongoing commitment to your health goals. Please review the following plan we discussed and let me know if I can assist you in the future.   Screening recommendations/referrals: Colonoscopy: done 11/27/17 Mammogram:  Done 06/26/17. Please call [phone] to schedule your mammogram and bone density screening.  Bone Density: done 01/30/17 Recommended yearly ophthalmology/optometry visit for glaucoma screening and checkup Recommended yearly dental visit for hygiene and checkup  Vaccinations: Influenza vaccine: postponed Pneumococcal vaccine: done 11/02/13 Tdap vaccine: done 01/18/11 Shingles vaccine: Shingrix discussed. Please contact your pharmacy for coverage information.  Covid-19: done 05/13/19, 06/03/19 & 02/24/20  Advanced directives: Advance directive discussed with you today. I have provided a copy for you to complete at home and have notarized. Once this is complete please bring a copy in to our office so we can scan it into your chart.  Conditions/risks identified: Recommend increasing physical activity to at least 3 days per week   Next appointment: Follow up in one year for your annual wellness visit    Preventive Care 65 Years and Older, Female Preventive care refers to lifestyle choices and visits with your health care provider that can promote health and wellness. What does preventive care include?  A yearly physical exam. This is also called an annual well check.  Dental exams once or twice a year.  Routine eye exams. Ask your health care provider how often you should have your eyes checked.  Personal lifestyle choices, including:  Daily care of your teeth and gums.  Regular physical activity.  Eating a healthy diet.  Avoiding tobacco and drug use.  Limiting alcohol use.  Practicing safe sex.  Taking low-dose aspirin every day.  Taking vitamin and mineral  supplements as recommended by your health care provider. What happens during an annual well check? The services and screenings done by your health care provider during your annual well check will depend on your age, overall health, lifestyle risk factors, and family history of disease. Counseling  Your health care provider may ask you questions about your:  Alcohol use.  Tobacco use.  Drug use.  Emotional well-being.  Home and relationship well-being.  Sexual activity.  Eating habits.  History of falls.  Memory and ability to understand (cognition).  Work and work Statistician.  Reproductive health. Screening  You may have the following tests or measurements:  Height, weight, and BMI.  Blood pressure.  Lipid and cholesterol levels. These may be checked every 5 years, or more frequently if you are over 76 years old.  Skin check.  Lung cancer screening. You may have this screening every year starting at age 37 if you have a 30-pack-year history of smoking and currently smoke or have quit within the past 15 years.  Fecal occult blood test (FOBT) of the stool. You may have this test every year starting at age 67.  Flexible sigmoidoscopy or colonoscopy. You may have a sigmoidoscopy every 5 years or a colonoscopy every 10 years starting at age 53.  Hepatitis C blood test.  Hepatitis B blood test.  Sexually transmitted disease (STD) testing.  Diabetes screening. This is done by checking your blood sugar (glucose) after you have not eaten for a while (fasting). You may have this done every 1-3 years.  Bone density scan. This is done to screen for osteoporosis. You may have this done starting at age 78.  Mammogram. This may be done every  1-2 years. Talk to your health care provider about how often you should have regular mammograms. Talk with your health care provider about your test results, treatment options, and if necessary, the need for more tests. Vaccines  Your  health care provider may recommend certain vaccines, such as:  Influenza vaccine. This is recommended every year.  Tetanus, diphtheria, and acellular pertussis (Tdap, Td) vaccine. You may need a Td booster every 10 years.  Zoster vaccine. You may need this after age 18.  Pneumococcal 13-valent conjugate (PCV13) vaccine. One dose is recommended after age 43.  Pneumococcal polysaccharide (PPSV23) vaccine. One dose is recommended after age 18. Talk to your health care provider about which screenings and vaccines you need and how often you need them. This information is not intended to replace advice given to you by your health care provider. Make sure you discuss any questions you have with your health care provider. Document Released: 04/29/2015 Document Revised: 12/21/2015 Document Reviewed: 02/01/2015 Elsevier Interactive Patient Education  2017 [place] Prevention in the Home Falls can cause injuries. They can happen to people of all ages. There are many things you can do to make your home safe and to help prevent falls. What can I do on the outside of my home?  Regularly fix the edges of walkways and driveways and fix any cracks.  Remove anything that might make you trip as you walk through a door, such as a raised step or threshold.  Trim any bushes or trees on the path to your home.  Use bright outdoor lighting.  Clear any walking paths of anything that might make someone trip, such as rocks or tools.  Regularly check to see if handrails are loose or broken. Make sure that both sides of any steps have handrails.  Any raised decks and porches should have guardrails on the edges.  Have any leaves, snow, or ice cleared regularly.  Use sand or salt on walking paths during winter.  Clean up any spills in your garage right away. This includes oil or grease spills. What can I do in the bathroom?  Use night lights.  Install grab bars by the toilet and in the tub and  shower. Do not use towel bars as grab bars.  Use non-skid mats or decals in the tub or shower.  If you need to sit down in the shower, use a plastic, non-slip stool.  Keep the floor dry. Clean up any water that spills on the floor as soon as it happens.  Remove soap buildup in the tub or shower regularly.  Attach bath mats securely with double-sided non-slip rug tape.  Do not have throw rugs and other things on the floor that can make you trip. What can I do in the bedroom?  Use night lights.  Make sure that you have a light by your bed that is easy to reach.  Do not use any sheets or blankets that are too big for your bed. They should not hang down onto the floor.  Have a firm chair that has side arms. You can use this for support while you get dressed.  Do not have throw rugs and other things on the floor that can make you trip. What can I do in the kitchen?  Clean up any spills right away.  Avoid walking on wet floors.  Keep items that you use a lot in easy-to-reach places.  If you need to reach something above you, use a strong  step stool that has a grab bar.  Keep electrical cords out of the way.  Do not use floor polish or wax that makes floors slippery. If you must use wax, use non-skid floor wax.  Do not have throw rugs and other things on the floor that can make you trip. What can I do with my stairs?  Do not leave any items on the stairs.  Make sure that there are handrails on both sides of the stairs and use them. Fix handrails that are broken or loose. Make sure that handrails are as long as the stairways.  Check any carpeting to make sure that it is firmly attached to the stairs. Fix any carpet that is loose or worn.  Avoid having throw rugs at the top or bottom of the stairs. If you do have throw rugs, attach them to the floor with carpet tape.  Make sure that you have a light switch at the top of the stairs and the bottom of the stairs. If you do not  have them, ask someone to add them for you. What else can I do to help prevent falls?  Wear shoes that:  Do not have high heels.  Have rubber bottoms.  Are comfortable and fit you well.  Are closed at the toe. Do not wear sandals.  If you use a stepladder:  Make sure that it is fully opened. Do not climb a closed stepladder.  Make sure that both sides of the stepladder are locked into place.  Ask someone to hold it for you, if possible.  Clearly mark and make sure that you can see:  Any grab bars or handrails.  First and last steps.  Where the edge of each step is.  Use tools that help you move around (mobility aids) if they are needed. These include:  Canes.  Walkers.  Scooters.  Crutches.  Turn on the lights when you go into a dark area. Replace any light bulbs as soon as they burn out.  Set up your furniture so you have a clear path. Avoid moving your furniture around.  If any of your floors are uneven, fix them.  If there are any pets around you, be aware of where they are.  Review your medicines with your doctor. Some medicines can make you feel dizzy. This can increase your chance of falling. Ask your doctor what other things that you can do to help prevent falls. This information is not intended to replace advice given to you by your health care provider. Make sure you discuss any questions you have with your health care provider. Document Released: 01/27/2009 Document Revised: 09/08/2015 Document Reviewed: 05/07/2014 Elsevier Interactive Patient Education  2017 [employer].

## 2020-09-08 NOTE — Progress Notes (Signed)
Subjective:   Angela Kelly is a 75 y.o. female who presents for Medicare Annual (Subsequent) preventive examination.  Review of Systems     Cardiac Risk Factors include: advanced age (>39men, >27 women);dyslipidemia     Objective:    Today's Vitals   09/08/20 1006 09/08/20 1008  BP: 140/80   Pulse: 91   Resp: 16   Temp: 98.2 F (36.8 C)   TempSrc: Oral   SpO2: 99%   Weight: 110 lb 3.2 oz (50 kg)   Height: 4\' 10"  (1.473 m)   PainSc:  3    Body mass index is 23.03 kg/m.  Advanced Directives 09/08/2020 06/03/2020 09/08/2019 06/05/2018 11/27/2017 07/23/2017 11/14/2016  Does Patient Have a Medical Advance Directive? No No No No No Yes Yes  Type of Advance Directive - - - - - Press photographer;Living will [place];Living will  Copy of [place] in Chart? - - - - - No - copy requested No - copy requested  Would patient like information on creating a medical advance directive? Yes (MAU/Ambulatory/Procedural Areas - Information given) - Yes (MAU/Ambulatory/Procedural Areas - Information given) - - - -    Current Medications (verified) Outpatient Encounter Medications as of 09/08/2020  Medication Sig  . acetaminophen (TYLENOL) 500 MG tablet Take 500 mg by mouth every 6 (six) hours as needed.  Marland Kitchen acyclovir (ZOVIRAX) 800 MG tablet   . aspirin EC 81 MG tablet Take 1 tablet (81 mg total) by mouth daily. (Patient taking differently: Take 81 mg by mouth at bedtime.)  . calcium carbonate (TUMS - DOSED IN MG ELEMENTAL CALCIUM) 500 MG chewable tablet Chew 1-3 tablets by mouth daily as needed for indigestion or heartburn.  . ciclesonide (OMNARIS) 50 MCG/ACT nasal spray Place into the nose.  . diphenhydrAMINE (BENADRYL) 25 mg capsule Take 25 mg by mouth See admin instructions. Take 25 mg every night and may take an additional 25 mg dose during the day as needed for allergies  . doxycycline (VIBRAMYCIN) 50 MG capsule Take 50 mg by mouth daily.   .  hydrocortisone cream 1 % Apply 1 application topically 2 (two) times daily.  Marland Kitchen levothyroxine (SYNTHROID) 75 MCG tablet Take 1 tablet (75 mcg total) by mouth daily before breakfast.  . metroNIDAZOLE (METROGEL) 1 % gel Apply 1 application topically daily.  . mometasone (NASONEX) 50 MCG/ACT nasal spray PLACE 2 SPRAYS INTO THE NOSE DAILY.  . Multiple Vitamin (MULTIVITAMIN) capsule Take by mouth.  Marland Kitchen PAZEO 0.7 % SOLN Place 1 drop into both eyes at bedtime.   . Probiotic Product (PROBIOTIC DAILY PO) Take 1 capsule by mouth daily as needed (when taking antibiotics).    No facility-administered encounter medications on file as of 09/08/2020.    Allergies (verified) Nickel, Codeine, Lovastatin, and Tape   History: Past Medical History:  Diagnosis Date  . Allergic rhinitis   . Allergy   . Anemia   . Arthritis   . Bilateral ovarian cysts    laterality not known  . Bunion   . Cataract of both eyes   . Complication of anesthesia    Bridge x 2 perm., crowns front   . Deviated septum   . Difficult intubation   . Elevated BP   . Environmental and seasonal allergies   . Essential hypertension   . Fibrocystic breast   . GERD (gastroesophageal reflux disease)   . Hammer toe   . History of closed fracture of nasal bones   .  History of pneumonia    in her 35's  . Hyperlipidemia   . IFG (impaired fasting glucose)   . Ocular rosacea    see's [facility]  . OSA (obstructive sleep apnea)   . Osteoarthritis cervical spine   . Osteoarthritis of both hands   . Osteoporosis   . Osteoporosis   . Rosacea    telengiectatic  . Shingles    left eye  . Sleep apnea   . Thyroid disease   . Thyroid nodule    left, sees Endocrinology  . Vitamin D deficiency disease    Past Surgical History:  Procedure Laterality Date  . BACK SURGERY     lumbar laminectomy  . BIOPSY THYROID    . BREAST CYST ASPIRATION Left   . BREAST SURGERY    . BUNIONECTOMY Right   . COLONOSCOPY WITH PROPOFOL N/A  11/27/2017   Procedure: COLONOSCOPY WITH PROPOFOL;  Surgeon: Virgel Manifold, MD;  Location: [facility] ENDOSCOPY;  Service: Endoscopy;  Laterality: N/A;  . EYE SURGERY    . SPINE SURGERY     Family History  Problem Relation Age of Onset  . Asthma Mother   . Hypertension Mother   . Diabetes Mother   . Cancer Mother        Leukemia  . Peripheral vascular disease Mother   . Osteoporosis Mother   . Glaucoma Mother   . Arthritis Mother   . Varicose Veins Mother   . Heart disease Father        coronary artery occlusion  . Early death Father   . Heart attack Father   . Hypertension Sister   . Diabetes Sister   . Glaucoma Sister   . Asthma Sister   . Hearing loss Sister   . Kidney disease Sister   . Vision loss Sister   . Varicose Veins Sister   . Diabetes Maternal Grandmother   . Heart disease Maternal Grandmother   . Colon cancer Maternal Uncle   . Lung cancer Paternal Uncle   . Early death Paternal Uncle   . Diabetes Maternal Grandfather    Social History   Socioeconomic History  . Marital status: Divorced    Spouse name: Not on file  . Number of children: 3  . Years of education: some college  . Highest education level: 12th grade  Occupational History  . Occupation: Retired  Tobacco Use  . Smoking status: Never Smoker  . Smokeless tobacco: Never Used  . Tobacco comment: smoking cesaation materials not required  Vaping Use  . Vaping Use: Never used  Substance and Sexual Activity  . Alcohol use: Yes    Alcohol/week: 0.0 standard drinks    Comment: Wine (very rarely).  . Drug use: No  . Sexual activity: Not Currently  Other Topics Concern  . Not on file  Social History Narrative   Pt's son lives with her.    Social Determinants of Health   Financial Resource Strain: Low Risk   . Difficulty of Paying Living Expenses: Not hard at all  Food Insecurity: No Food Insecurity  . Worried About Charity fundraiser in the Last Year: Never true  . Ran Out of Food in  the Last Year: Never true  Transportation Needs: No Transportation Needs  . Lack of Transportation (Medical): No  . Lack of Transportation (Non-Medical): No  Physical Activity: Insufficiently Active  . Days of Exercise per Week: 2 days  . Minutes of Exercise per Session: 20 min  Stress: Stress Concern Present  . Feeling of Stress : To some extent  Social Connections: Socially Isolated  . Frequency of Communication with Friends and Family: More than three times a week  . Frequency of Social Gatherings with Friends and Family: Twice a week  . Attends Religious Services: Never  . Active Member of Clubs or Organizations: No  . Attends Archivist Meetings: Never  . Marital Status: Divorced    Tobacco Counseling Counseling given: Not Answered Comment: smoking cesaation materials not required   Clinical Intake:  Pre-visit preparation completed: Yes  Pain : 0-10 Pain Score: 3  Pain Type: Chronic pain Pain Location: Back Pain Orientation: Lower Pain Descriptors / Indicators: Aching,Sore Pain Onset: More than a month ago Pain Frequency: Constant     BMI - recorded: 23.03 Nutritional Status: BMI of 19-24  Normal Nutritional Risks: None Diabetes: No  How often do you need to have someone help you when you read instructions, pamphlets, or other written materials from your doctor or pharmacy?: 1 - Never   Interpreter Needed?: No  Information entered by :: Clemetine Marker LPN   Activities of Daily Living In your present state of health, do you have any difficulty performing the following activities: 09/08/2020 07/04/2020  Hearing? N N  Comment declines hearing aids -  Vision? N Y  Difficulty concentrating or making decisions? N N  Walking or climbing stairs? N N  Dressing or bathing? N N  Doing errands, shopping? N N  Preparing Food and eating ? N -  Using the Toilet? N -  In the past six months, have you accidently leaked urine? Y -  Comment wears pads  occasionally for protection -  Do you have problems with loss of bowel control? N -  Managing your Medications? N -  Managing your Finances? N -  Housekeeping or managing your Housekeeping? N -  Some recent data might be hidden    Patient Care Team: Delsa Grana, PA-C as PCP - General (Family Medicine) Dingeldein, Remo Lipps, MD (Ophthalmology)  Indicate any recent Medical Services you may have received from other than [facility] providers in the past year (date may be approximate).     Assessment:   This is a routine wellness examination for [place].  Hearing/Vision screen  Hearing Screening   125Hz  250Hz  500Hz  1000Hz  2000Hz  3000Hz  4000Hz  6000Hz  8000Hz   Right ear:           Left ear:           Comments: Pt denies hearing difficulty  Vision Screening Comments: Annual vision screenings at [facility] Dr. Thomasene Ripple  Dietary issues and exercise activities discussed: Current Exercise Habits: Home exercise routine, Type of exercise: walking, Time (Minutes): 15, Frequency (Times/Week): 2, Weekly Exercise (Minutes/Week): 30, Intensity: Mild, Exercise limited by: orthopedic condition(s)  Goals Addressed            This Visit's Progress   . DIET - INCREASE WATER INTAKE   On track    Recommend to drink at least 6-8 8oz glasses of water per day.    . Increase physical activity       Recommend increasing physical activity to at least 3 days per week      Depression Screen PHQ 2/9 Scores 09/08/2020 07/04/2020 06/13/2020 09/08/2019 09/08/2019 12/26/2018 05/20/2018  PHQ - 2 Score 3 6 0 0 0 0 0  PHQ- 9 Score 3 - - 0 - 0 0    Fall Risk Fall Risk  09/08/2020 07/04/2020  06/13/2020 09/08/2019 09/08/2019  Falls in the past year? 0 0 0 0 0  Number falls in past yr: 0 0 0 0 0  Injury with Fall? 0 0 0 0 0  Risk for fall due to : No Fall Risks - - - No Fall Risks;Impaired vision  Risk for fall due to: Comment - - - - -  Follow up Falls prevention discussed - - - Falls prevention discussed    FALL  RISK PREVENTION PERTAINING TO THE HOME:  Any stairs in or around the home? Yes  If so, are there any without handrails? Yes - 2 outside steps Home free of loose throw rugs in walkways, pet beds, electrical cords, etc? Yes  Adequate lighting in your home to reduce risk of falls? Yes   ASSISTIVE DEVICES UTILIZED TO PREVENT FALLS:  Life alert? No  Use of a cane, walker or w/c? No  Grab bars in the bathroom? No  Shower chair or bench in shower? No  Elevated toilet seat or a handicapped toilet? No   TIMED UP AND GO:  Was the test performed? Yes .  Length of time to ambulate 10 feet: 4 sec.   Gait steady and fast without use of assistive device  Cognitive Function: Normal cognitive status assessed by direct observation by this Nurse Health Advisor. No abnormalities found.       6CIT Screen 09/08/2019 07/23/2017  What Year? 0 points 0 points  What month? 0 points 0 points  What time? 0 points 0 points  Count back from 20 0 points 0 points  Months in reverse 0 points 0 points  Repeat phrase 0 points 2 points  Total Score 0 2    Immunizations Immunization History  Administered Date(s) Administered  . Fluad Quad(high Dose 65+) 12/26/2018  . Influenza, High Dose Seasonal PF 02/21/2017, 01/10/2018  . Influenza-Unspecified 01/20/2013  . PFIZER(Purple Top)SARS-COV-2 Vaccination 05/13/2019, 06/03/2019, 02/24/2020  . Pneumococcal Conjugate-13 11/02/2013  . Pneumococcal Polysaccharide-23 03/30/2011, 11/02/2013  . Tdap 01/18/2011  . Zoster 06/14/2013    TDAP status: Up to date  Flu Vaccine status: Declined, Education has been provided regarding the importance of this vaccine but patient still declined. Advised may receive this vaccine at local pharmacy or Health Dept. Aware to provide a copy of the vaccination record if obtained from local pharmacy or Health Dept. Verbalized acceptance and understanding.  Pneumococcal vaccine status: Up to date  Covid-19 vaccine status: Completed  vaccines  Qualifies for Shingles Vaccine? Yes   Zostavax completed Yes   Shingrix Completed?: No.    Education has been provided regarding the importance of this vaccine. Patient has been advised to call insurance company to determine out of pocket expense if they have not yet received this vaccine. Advised may also receive vaccine at local pharmacy or Health Dept. Verbalized acceptance and understanding.  Screening Tests Health Maintenance  Topic Date Due  . PNA vac Low Risk Adult (2 of 2 - PCV13) 11/03/2014  . MAMMOGRAM  06/27/2018  . INFLUENZA VACCINE  11/14/2020  . COLONOSCOPY (Pts 45-29yrs Insurance coverage will need to be confirmed)  11/28/2022  . DEXA SCAN  Completed  . COVID-19 Vaccine  Completed  . Hepatitis C Screening  Completed  . HPV VACCINES  Aged Out  . TETANUS/TDAP  Discontinued    Health Maintenance  Health Maintenance Due  Topic Date Due  . PNA vac Low Risk Adult (2 of 2 - PCV13) 11/03/2014  . MAMMOGRAM  06/27/2018    Colorectal  cancer screening: Type of screening: Colonoscopy. Completed 11/27/17. Repeat every 5 years  Mammogram status: Completed 06/26/17. Repeat every year  Bone Density status: Completed 01/30/17. Results reflect: Bone density results: OSTEOPENIA. Repeat every 2 years.  Lung Cancer Screening: (Low Dose CT Chest recommended if Age 38-80 years, 30 pack-year currently smoking OR have quit w/in 15years.) does not qualify.   Additional Screening:  Hepatitis C Screening: does qualify; Completed 11/07/15  Vision Screening: Recommended annual ophthalmology exams for early detection of glaucoma and other disorders of the eye. Is the patient up to date with their annual eye exam?  Yes  Who is the provider or what is the name of the office in which the patient attends annual eye exams? [facility] Screening: Recommended annual dental exams for proper oral hygiene  Community Resource Referral / Chronic Care Management: CRR required  this visit?  No   CCM required this visit?  No      Plan:     I have personally reviewed and noted the following in the patient's chart:   . Medical and social history . Use of alcohol, tobacco or illicit drugs  . Current medications and supplements including opioid prescriptions.  . Functional ability and status . Nutritional status . Physical activity . Advanced directives . List of other physicians . Hospitalizations, surgeries, and ER visits in previous 12 months . Vitals . Screenings to include cognitive, depression, and falls . Referrals and appointments  In addition, I have reviewed and discussed with patient certain preventive protocols, quality metrics, and best practice recommendations. A written personalized care plan for preventive services as well as general preventive health recommendations were provided to patient.     Clemetine Marker, LPN   [id number]   Nurse Notes: pt advised due for follow up 12/2020

## 2020-10-06 ENCOUNTER — Telehealth: Payer: Self-pay

## 2020-10-06 NOTE — Telephone Encounter (Signed)
Copied from [place] [phone]. Topic: Appointment Scheduling - Scheduling Inquiry for Clinic >> Oct 06, 2020  9:06 AM Valere Dross wrote: Reason for CRM: Pt called in stating she is having some pain under her ribs, pt was wanting to be seen today, or something sooner than the 07/18 appt that was offered, pt requested someone reach out to her. Please advise

## 2020-10-06 NOTE — Telephone Encounter (Signed)
Made pt appt for 10-10-2020 but said she may go somewhere before then

## 2020-10-10 ENCOUNTER — Ambulatory Visit (INDEPENDENT_AMBULATORY_CARE_PROVIDER_SITE_OTHER): Payer: Medicare Other | Admitting: Family Medicine

## 2020-10-10 ENCOUNTER — Other Ambulatory Visit: Payer: Self-pay

## 2020-10-10 ENCOUNTER — Encounter: Payer: Self-pay | Admitting: Family Medicine

## 2020-10-10 VITALS — BP 130/80 | HR 96 | Temp 98.1°F | Resp 16 | Ht <= 58 in | Wt 107.9 lb

## 2020-10-10 DIAGNOSIS — R109 Unspecified abdominal pain: Secondary | ICD-10-CM | POA: Insufficient documentation

## 2020-10-10 DIAGNOSIS — R1011 Right upper quadrant pain: Secondary | ICD-10-CM | POA: Diagnosis not present

## 2020-10-10 LAB — CBC WITH DIFFERENTIAL/PLATELET
Absolute Monocytes: 464 cells/uL (ref 200–950)
Basophils Absolute: 43 cells/uL (ref 0–200)
Basophils Relative: 0.5 %
Eosinophils Absolute: 112 cells/uL (ref 15–500)
Eosinophils Relative: 1.3 %
HCT: 46.6 % — ABNORMAL HIGH (ref 35.0–45.0)
Hemoglobin: 15.3 g/dL (ref 11.7–15.5)
Lymphs Abs: 1840 cells/uL (ref 850–3900)
MCH: 28.6 pg (ref 27.0–33.0)
MCHC: 32.8 g/dL (ref 32.0–36.0)
MCV: 87.1 fL (ref 80.0–100.0)
MPV: 10.4 fL (ref 7.5–12.5)
Monocytes Relative: 5.4 %
Neutro Abs: 6140 cells/uL (ref 1500–7800)
Neutrophils Relative %: 71.4 %
Platelets: 395 10*3/uL (ref 140–400)
RBC: 5.35 10*6/uL — ABNORMAL HIGH (ref 3.80–5.10)
RDW: 12.6 % (ref 11.0–15.0)
Total Lymphocyte: 21.4 %
WBC: 8.6 10*3/uL (ref 3.8–10.8)

## 2020-10-10 MED ORDER — IBUPROFEN 600 MG PO TABS: 600.0000 mg | ORAL_TABLET | Freq: Three times a day (TID) | ORAL | 0 refills | Status: AC | PRN

## 2020-10-10 NOTE — Progress Notes (Signed)
    SUBJECTIVE:   CHIEF COMPLAINT / HPI:   R SIDE PAIN  - R rib pain  - comes and goes, tender to touch Duration: 4 days Onset: sudden Location:  RUQ, over R ribs Radiation: occasoinally down side Frequency: intermittent Alleviating factors: sleep, drinking cranberry juice Aggravating factors: none. May noticed increase in pain with greasy foods but not sure. Status: fluctuating Treatments attempted:  cranberry juice Fever: no Nausea: no Vomiting: no Weight loss: no Decreased appetite: no Diarrhea: 1-3 looser Bms per day over the past month Constipation: no Blood in stool: no Heartburn:  a little Jaundice: no Rash: no Dysuria/urinary frequency: no Hematuria: no SOB: no Pain with deep breathing: no Recurrent NSAID use: no Denies recent trauma or falls.   OBJECTIVE:   BP 130/80   Pulse 96   Temp 98.1 F (36.7 C)   Resp 16   Ht 4\' 10"  (1.473 m)   Wt 107 lb 14.4 oz (48.9 kg)   SpO2 99%   BMI 22.55 kg/m   Gen: well appearing, in NAD Card: RRR Lungs: CTAB Abd: +BM. soft, TTP in RUQ, worse over R ribs (~7-9) in axillary line without crepitus, bony stepoffs. Negative Murphy sign. No rebound tenderness or guarding.   Bedside POCUS performed with normal appearing liver, R kidney, IVC. Gallbladder present without wall thickening. Nonobstructing non-calcified stone vs polyp present, does not appear larger than previous imaging 2 years prior.   ASSESSMENT/PLAN:   Side pain R side pain overlying R lower ribs, 4 day duration without preceding trauma. Abdominal exam and [place] unrevealing except for nonobstructing, noncalcified stone vs polyp as previously seen. Findings most consistent with costochondritis though will obtain labs to r/o infectious, liver, GB etiology given location. Will provide 5 day course of NSAIDs. F/u if no better in about a week, can consider formal imaging at that time.    Myles Gip, DO

## 2020-10-10 NOTE — Patient Instructions (Signed)
It was great to see you!  Our plans for today:  - Take the ibuprofen 600mg  every 8 hours as needed for pain. If you continue to have pain after about 5 days or if your pain worsens or you develop nausea or vomiting, come back to see [place]. We will likely get formal imaging at that time.  - We are checking some labs today, we will release these results to your MyChart.  Take care and seek immediate care sooner if you develop any concerns.   Dr. Ky Barban

## 2020-10-10 NOTE — Assessment & Plan Note (Signed)
R side pain overlying R lower ribs, 4 day duration without preceding trauma. Abdominal exam and [place] unrevealing except for nonobstructing, noncalcified stone vs polyp as previously seen. Findings most consistent with costochondritis though will obtain labs to r/o infectious, liver, GB etiology given location. Will provide 5 day course of NSAIDs. F/u if no better in about a week, can consider formal imaging at that time.

## 2020-10-29 ENCOUNTER — Other Ambulatory Visit: Payer: Self-pay | Admitting: Family Medicine

## 2021-01-23 ENCOUNTER — Ambulatory Visit: Payer: Self-pay

## 2021-01-23 ENCOUNTER — Encounter: Payer: Self-pay | Admitting: Family Medicine

## 2021-01-23 ENCOUNTER — Ambulatory Visit (INDEPENDENT_AMBULATORY_CARE_PROVIDER_SITE_OTHER): Payer: Medicare Other | Admitting: Family Medicine

## 2021-01-23 ENCOUNTER — Other Ambulatory Visit: Payer: Self-pay

## 2021-01-23 VITALS — BP 132/80 | HR 71 | Temp 97.7°F | Resp 16 | Ht 59.0 in | Wt 107.6 lb

## 2021-01-23 DIAGNOSIS — Z889 Allergy status to unspecified drugs, medicaments and biological substances status: Secondary | ICD-10-CM | POA: Diagnosis not present

## 2021-01-23 DIAGNOSIS — Z872 Personal history of diseases of the skin and subcutaneous tissue: Secondary | ICD-10-CM | POA: Diagnosis not present

## 2021-01-23 DIAGNOSIS — Z8619 Personal history of other infectious and parasitic diseases: Secondary | ICD-10-CM | POA: Diagnosis not present

## 2021-01-23 DIAGNOSIS — J309 Allergic rhinitis, unspecified: Secondary | ICD-10-CM

## 2021-01-23 DIAGNOSIS — R21 Rash and other nonspecific skin eruption: Secondary | ICD-10-CM

## 2021-01-23 MED ORDER — VALACYCLOVIR HCL 1 G PO TABS
1000.0000 mg | ORAL_TABLET | Freq: Three times a day (TID) | ORAL | 0 refills | Status: DC
Start: 2021-01-23 — End: 2021-01-30

## 2021-01-23 MED ORDER — CROMOLYN SODIUM 4 % OP SOLN: 1.0000 [drp] | Freq: Four times a day (QID) | OPHTHALMIC | 2 refills | Status: DC | PRN

## 2021-01-23 MED ORDER — PREDNISONE 20 MG PO TABS: ORAL_TABLET | ORAL | 0 refills | Status: DC

## 2021-01-23 NOTE — Telephone Encounter (Signed)
Pt has a history of shingles and takes doxycycline to prevent flare-ups. Pt read off of the bottle.   Pt is not taking any antivirals. Pt states that she has lost some vision in one eye d/y shingles and now shingle is very close to her good eye.   Appointment made for this after noon with Delsa Grana.  Reason for Disposition  Shingles rash on the eyelid or tip of the nose  Answer Assessment - Initial Assessment Questions 1. APPEARANCE of RASH: "Describe the rash."      Near eye 2. LOCATION: "Where is the rash located?"      face 3. ONSET: "When did the rash start?"      na 4. ITCHING: "Does the rash itch?" If Yes, ask: "How bad is the itch?"  (Scale 1-10; or mild, moderate, severe)     na 5. PAIN: "Does the rash hurt?" If Yes, ask: "How bad is the pain?"  (Scale 0-10; or none, mild, moderate, severe)    - NONE (0): no pain    - MILD (1-3): doesn't interfere with normal activities     - MODERATE (4-7): interferes with normal activities or awakens from sleep     - SEVERE (8-10): excruciating pain, unable to do any normal activities     na 6. OTHER SYMPTOMS: "Do you have any other symptoms?" (e.g., fever)     na 7. PREGNANCY: "Is there any chance you are pregnant?" "When was your last menstrual period?"     na  Protocols used: Shingles (Zoster)-A-AH

## 2021-01-23 NOTE — Progress Notes (Deleted)
Itchy, allergies, right side, and right back feels like shingles flaring up Using pataday

## 2021-01-23 NOTE — Progress Notes (Signed)
Patient ID: Angela Kelly, female    DOB: November 03, 1945, 75 y.o.   MRN: [id number]  PCP: Delsa Grana, PA-C  Chief Complaint  Patient presents with   Herpes Zoster    Subjective:   Angela Kelly is a 75 y.o. female, presents to clinic with CC of the following:  HPI  Pt presents with concerns for herpes zoster/shingles/rash to face and around right eye and to back Per chart multiple encounters for the same, she is here with recurrent rash asking for refill on doxycycline which she states she takes to prevent flares?  She has plenty in current Rx from Dr. Phillip Heal She does see an ophthalmologist states that she had permanent vision damage in her left eye with prior shingles outbreak many years ago.   She endorses itchy eyes, not well controlled with over-the-counter allergy eyedrops. She is mainly concerned about seeing rash develop around her right eye and having nerve pain and irritation located they are similar to when she had shingles in the past she also has some discomfort to her upper mid back, no visible rash in exam room She endorses increased sneezing with her other symptoms using over-the-counter Benadryl.  She is not on a 24-hour antihistamine.  Uses steroid nasal sprays, was previously referred to the allergist but did not go.  Long history of multiple allergies including nickel.  She is using prescribed topical steroid to her face, she states that is why there is no rash today.   She denies any eye pain, vision changes.   No sore throat, wheeze, shortness of breath, abdominal pain  Reviewed UC visit from last year - Eular is a pleasant 75 y.o. female presenting for a rash described as burning and itching that began 3-5 days ago and has been gradually worsening since onset. The rash is improved by hydrocortisone cream and worsened by touch. She has a history significant for HZO in the past which she reports spread to her left eye and resulted in scarring, causing her permanent  vision loss, so she is very concerned that this could be shingles and presents for assessment. She denies body aches, chills, fever, nausea, shortness of breath, throat tightness, vomiting and wheezing. Also denies any new onset of blurry vision (though states her left eye always is blurry), eye pain, painful EOM, photophobia, eye discharge, eye redness, headache, ear pain, numbness/tingling, facial droop.  ED visit from 05/2020, OV f/up in our clinic saw Dr. Ky Barban.       Patient Active Problem List   Diagnosis Date Noted   Side pain 10/10/2020   Gallstones 05/15/2018   Aortic atherosclerosis ([place]) 05/06/2018   Thoracogenic scoliosis of thoracic region 10/16/2017   Gall bladder polyp 09/17/2017   Essential hypertension 07/31/2017   GERD (gastroesophageal reflux disease) 07/31/2017   Full code status 07/31/2017   Myalgia due to HMG CoA reductase inhibitor 07/31/2017   Abnormal mammogram of left breast 01/18/2015   OSA (obstructive sleep apnea)    Hyperlipidemia    Thyroid disease    Cataract of both eyes    Ocular rosacea    Bunion    Hammer toe    Allergic rhinitis    Hypothyroidism 08/03/2014   Left thyroid nodule 08/03/2014   IFG (impaired fasting glucose) 08/03/2014   Osteopenia 08/03/2014   SOB (shortness of breath) 11/05/2013   Anemia, unspecified 08/21/2013   Depression 08/21/2013   Osteoarthritis 08/21/2013   Osteoporosis 08/21/2013      Current Outpatient Medications:  acetaminophen (TYLENOL) 500 MG tablet, Take 500 mg by mouth every 6 (six) hours as needed., Disp: , Rfl:    acyclovir (ZOVIRAX) 800 MG tablet, , Disp: , Rfl:    aspirin EC 81 MG tablet, Take 1 tablet (81 mg total) by mouth daily. (Patient taking differently: Take 81 mg by mouth at bedtime.), Disp: 30 tablet, Rfl: 11   calcium carbonate (TUMS - DOSED IN MG ELEMENTAL CALCIUM) 500 MG chewable tablet, Chew 1-3 tablets by mouth daily as needed for indigestion or heartburn., Disp: , Rfl:    ciclesonide  (OMNARIS) 50 MCG/ACT nasal spray, Place into the nose., Disp: , Rfl:    diphenhydrAMINE (BENADRYL) 25 mg capsule, Take 25 mg by mouth See admin instructions. Take 25 mg every night and may take an additional 25 mg dose during the day as needed for allergies, Disp: , Rfl:    doxycycline (VIBRAMYCIN) 50 MG capsule, Take 50 mg by mouth daily. , Disp: , Rfl: 3   hydrocortisone cream 1 %, Apply 1 application topically 2 (two) times daily., Disp: , Rfl:    levothyroxine (SYNTHROID) 75 MCG tablet, Take 1 tablet (75 mcg total) by mouth daily before breakfast., Disp: 90 tablet, Rfl: 3   metroNIDAZOLE (METROGEL) 1 % gel, Apply 1 application topically daily., Disp: , Rfl:    mometasone (NASONEX) 50 MCG/ACT nasal spray, PLACE 2 SPRAYS INTO THE NOSE DAILY., Disp: 51 each, Rfl: 1   Multiple Vitamin (MULTIVITAMIN) capsule, Take by mouth., Disp: , Rfl:    PAZEO 0.7 % SOLN, Place 1 drop into both eyes at bedtime. , Disp: , Rfl: 5   Probiotic Product (PROBIOTIC DAILY PO), Take 1 capsule by mouth daily as needed (when taking antibiotics). , Disp: , Rfl:    Allergies  Allergen Reactions   Nickel Rash    Almost went blind when wearing nickel eye glasses   Codeine Other (See Comments)    Ears ringing   Lovastatin Rash   Tape Rash     Social History   Tobacco Use   Smoking status: Never   Smokeless tobacco: Never   Tobacco comments:    smoking cesaation materials not required  Vaping Use   Vaping Use: Never used  Substance Use Topics   Alcohol use: Yes    Alcohol/week: 0.0 standard drinks    Comment: Wine (very rarely).   Drug use: No      Chart Review Today: I personally reviewed active problem list, medication list, allergies, family history, social history, health maintenance, notes from last encounter, lab results, imaging with the patient/caregiver today.   Review of Systems  Constitutional: Negative.   HENT:  Positive for sneezing. Negative for sore throat and voice change.   Eyes:   Positive for itching. Negative for photophobia, pain, redness and visual disturbance.  Respiratory: Negative.    Cardiovascular: Negative.   Gastrointestinal: Negative.   Endocrine: Negative.   Genitourinary: Negative.   Musculoskeletal: Negative.   Skin: Negative.   Allergic/Immunologic: Negative.   Neurological: Negative.   Hematological: Negative.   Psychiatric/Behavioral: Negative.    All other systems reviewed and are negative.     Objective:   Vitals:   01/23/21 1546  BP: 132/80  Pulse: 71  Resp: 16  Temp: 97.7 F (36.5 C)  TempSrc: Oral  SpO2: 97%  Weight: 107 lb 9.6 oz (48.8 kg)  Height: 4\' 11"  (1.499 m)    Body mass index is 21.73 kg/m.  Physical Exam Vitals and nursing note reviewed.  Constitutional:      General: She is not in acute distress.    Appearance: Normal appearance. She is well-developed. She is not ill-appearing, toxic-appearing or diaphoretic.     Interventions: Face mask in place.  HENT:     Head: Normocephalic and atraumatic. No right periorbital erythema or left periorbital erythema.     Comments: No periorbital erythema, edema or rash noted    Right Ear: Tympanic membrane, ear canal and external ear normal. There is no impacted cerumen.     Left Ear: Tympanic membrane, ear canal and external ear normal. There is no impacted cerumen.     Nose: Mucosal edema, congestion and rhinorrhea present. Rhinorrhea is clear.     Right Turbinates: Enlarged and swollen.     Right Sinus: No maxillary sinus tenderness or frontal sinus tenderness.     Left Sinus: No maxillary sinus tenderness or frontal sinus tenderness.     Comments: Right worse than left    Mouth/Throat:     Lips: Pink.     Mouth: Mucous membranes are moist.     Pharynx: Oropharynx is clear. Uvula midline. No pharyngeal swelling, oropharyngeal exudate or posterior oropharyngeal erythema.     Tonsils: No tonsillar exudate or tonsillar abscesses.  Eyes:     General: Lids are normal.  Allergic shiner present. No scleral icterus.       Right eye: No discharge.        Left eye: No discharge.     Extraocular Movements:     Right eye: Normal extraocular motion.     Left eye: Normal extraocular motion.     Conjunctiva/sclera:     Right eye: No chemosis, exudate or hemorrhage.    Left eye: No chemosis, exudate or hemorrhage.    Pupils: Pupils are equal, round, and reactive to light.     Slit lamp exam:    Right eye: No photophobia.     Left eye: No photophobia.     Comments: Palpebral conjunctiva erythematous, no discharge or exudate   Neck:     Trachea: Trachea and phonation normal.  Cardiovascular:     Rate and Rhythm: Normal rate and regular rhythm.     Pulses: Normal pulses.          Radial pulses are 2+ on the right side and 2+ on the left side.       Posterior tibial pulses are 2+ on the right side and 2+ on the left side.     Heart sounds: Normal heart sounds. No murmur heard.   No friction rub. No gallop.  Pulmonary:     Effort: Pulmonary effort is normal. No respiratory distress.     Breath sounds: Normal breath sounds. No stridor. No wheezing, rhonchi or rales.  Chest:     Chest wall: No tenderness.  Abdominal:     General: Bowel sounds are normal. There is no distension.     Palpations: Abdomen is soft.  Musculoskeletal:     Cervical back: Normal range of motion and neck supple.     Right lower leg: No edema.     Left lower leg: No edema.  Lymphadenopathy:     Cervical: No cervical adenopathy.  Skin:    General: Skin is warm and dry.     Coloration: Skin is not jaundiced or pale.     Findings: No erythema or rash.     Comments: Area noted to upper midline and right back had no rash, erythema, excoriations, edema  Neurological:     Mental Status: She is alert.     Cranial Nerves: No cranial nerve deficit, dysarthria or facial asymmetry.     Sensory: Sensation is intact.     Motor: Motor function is intact. No abnormal muscle tone.     Gait: Gait  normal.  Psychiatric:        Attention and Perception: Attention normal.        Mood and Affect: Mood is anxious. Mood is not depressed.        Speech: Speech normal.        Behavior: Behavior normal. Behavior is cooperative.     Results for orders placed or performed in visit on 10/10/20  CBC with Differential  Result Value Ref Range   WBC 8.6 3.8 - 10.8 Thousand/uL   RBC 5.35 (H) 3.80 - 5.10 Million/uL   Hemoglobin 15.3 11.7 - 15.5 g/dL   HCT 46.6 (H) 35.0 - 45.0 %   MCV 87.1 80.0 - 100.0 fL   MCH 28.6 27.0 - 33.0 pg   MCHC 32.8 32.0 - 36.0 g/dL   RDW 12.6 11.0 - 15.0 %   Platelets 395 140 - 400 Thousand/uL   MPV 10.4 7.5 - 12.5 fL   Neutro Abs 6,140 1,500 - 7,800 cells/uL   Lymphs Abs 1,840 850 - 3,900 cells/uL   Absolute Monocytes 464 200 - 950 cells/uL   Eosinophils Absolute 112 15 - 500 cells/uL   Basophils Absolute 43 0 - 200 cells/uL   Neutrophils Relative % 71.4 %   Total Lymphocyte 21.4 %   Monocytes Relative 5.4 %   Eosinophils Relative 1.3 %   Basophils Relative 0.5 %       Assessment & Plan:     ICD-10-CM   1. Rash and nonspecific skin eruption  R21    I suspect facial sx may be due to atopy? change in weather? seasonal allergies or rosacea, no signs of shingles today Encouraged her to continue meds per dermatology Add daily antihistamine Can try Rx allergy eye drops - may have some allergic conjunctivitis involvement?  No eyelid swelling today, no visible facial rash, no vision changes or eye pain I did send in putting instructed the patient to hold valacyclovir and prednisone.    2. History of shingles  Z86.19    right side of face and to midupper back, no visible rash/vessicles, may be dermatitis?  meds rx'd in case shingles sx or rash worsen or develop, will need ophth is any rash on face develops - discussed with with pt and she agrees to start meds and call her ophtho Though currently very much doubt HZO, no headache malaise fever unilateral eye pain,  visible rash hyperemic conjunctivitis uveitis.   I did review with the patient that recurrent herpes zoster is rare and even more rare to have herpes zoster ophthalmicus and to have involvement on both sides of the face and/or other areas of the body including her upper back    3. Allergic rhinitis, unspecified seasonality, unspecified trigger  J30.9    swelling, redness and nasal discharge on exam, some palprebal conjuncival erythema as well    4. History of seasonal allergies  Z88.9    previously declined allergy specialist referral, allergies generally worse in the past 10 years since moving to [place], advised starting 24 hour daily antihistamine    5. History of rosacea  Z87.2    suspect that is what Dr. Demetra Shiner is tx with topical metrogel, doxycycline and  topical prescription steroids -if skin sensitivity or rash worsens on face but does not appear to be in vesicles or appear to be shingles or herpes zoster encouraged her to follow-up with her dermatologist for further care for her sensitive skin In the meantime encouraged her to avoid changes in soaps detergents chemicals, keep her skin well hydrated with hypoallergenic products, starting the antihistamine could help and is unlikely to cause any harm     Add 24 antihistamine like over-the-counter Claritin, Zyrtec, Allegra or Xyzal Explained that she can additionally use Benadryl as needed for severe symptoms such as sneezing itchy watery eyes or itching Encouraged her to continue her medications from her dermatologist If any rash develops consistent with shingles including very red clusters of fluid-filled bumps with increased nerve pain itching or burning she was instructed to immediately start valacyclovir and +/- prednisone and follow-up with ophthalmology I do think this is unlikely and we did review the side effects of antiviral medications which she has been given multiple times in the past 1 to 2 years.  She did express that she is  very nervous about having shingles again because of the eye damage she sustained and anytime she feels something start that feels similar she is very alarmed.  Did encourage her to consider consulting with the allergy specialist again or following up with dermatology for continued care and monitoring of her skin/rash symptoms - which seem to flare in winter/fall months - likely related to weather/allergies/atopy? etc  Meds ordered this encounter  Medications   cromolyn (OPTICROM) 4 % ophthalmic solution    Sig: Place 1 drop into both eyes 4 (four) times daily as needed.    Dispense:  10 mL    Refill:  2    Order Specific Question:   Supervising Provider    Answer:   Steele Sizer [[id number]]   valACYclovir (VALTREX) 1000 MG tablet    Sig: Take 1 tablet (1,000 mg total) by mouth 3 (three) times daily.    Dispense:  21 tablet    Refill:  0    Order Specific Question:   Supervising Provider    Answer:   Steele Sizer [[id number]]   predniSONE (DELTASONE) 20 MG tablet    Sig: 2 tabs poqday 1-3, 1 tabs poqday 4-6    Dispense:  9 tablet    Refill:  0    Order Specific Question:   Supervising Provider    Answer:   Steele Sizer [[id number]]       Delsa Grana, PA-C 01/23/21 4:02 PM

## 2021-01-24 ENCOUNTER — Telehealth: Payer: Self-pay | Admitting: Family Medicine

## 2021-01-24 NOTE — Telephone Encounter (Signed)
Pt called stating that she is needing to verify wether she is supposed to be taking medication, Metronidazole. She states that she went to pharmacy and they did not have this on file and is confused. Please advise.      [id number] - [place], [place] - [address]  [address] [place] [address]  Phone: [phone] Fax: [phone]  Hours: Not open 24 hours

## 2021-01-25 NOTE — Telephone Encounter (Signed)
Spoke to pt and pt stated she already has a generic cream to use at home from her previous reasons and she will be using that one for now.

## 2021-01-30 ENCOUNTER — Ambulatory Visit: Payer: Self-pay

## 2021-01-30 ENCOUNTER — Other Ambulatory Visit: Payer: Self-pay | Admitting: Nurse Practitioner

## 2021-01-30 DIAGNOSIS — R21 Rash and other nonspecific skin eruption: Secondary | ICD-10-CM

## 2021-01-30 DIAGNOSIS — Z8619 Personal history of other infectious and parasitic diseases: Secondary | ICD-10-CM

## 2021-01-30 MED ORDER — PREDNISONE 20 MG PO TABS: ORAL_TABLET | ORAL | 0 refills | Status: DC

## 2021-01-30 MED ORDER — VALACYCLOVIR HCL 1 G PO TABS: 1000.0000 mg | ORAL_TABLET | Freq: Three times a day (TID) | ORAL | 0 refills | Status: DC

## 2021-01-30 NOTE — Telephone Encounter (Signed)
Pt called wanting refills of Valtrex and prednisone. Pt stated the rash is very close to her right eye and she is anxious.  Pain is severe.  Pt requesting a refill of the Valtrex and prednisone sent to [facility].  Pt refused any further triage. Called practice to let them know . Routing to [employer].             Answer Assessment - Initial Assessment Questions 1. APPEARANCE of RASH: "Describe the rash."      Blister  2. LOCATION: "Where is the rash located?"      to the back between shoulder blades,  under forehead, eyebrows bridge of nose and now close to right eye  3. ONSET: "When did the rash start?"      01/20/21 4. ITCHING: "Does the rash itch?" If Yes, ask: "How bad is the itch?"  (Scale 1-10; or mild, moderate, severe)     yes 5. PAIN: "Does the rash hurt?" If Yes, ask: "How bad is the pain?"  (Scale 0-10; or none, mild, moderate, severe)    - NONE (0): no pain    - MILD (1-3): doesn't interfere with normal activities     - MODERATE (4-7): interferes with normal activities or awakens from sleep     - SEVERE (8-10): excruciating pain, unable to do any normal activities     severe 6. OTHER SYMPTOMS: "Do you have any other symptoms?" (e.g., fever)     no 7. PREGNANCY: "Is there any chance you are pregnant?" "When was your last menstrual period?"     N/a  Protocols used: Shingles (Zoster)-A-AH

## 2021-01-30 NOTE — Telephone Encounter (Signed)
Notified pt. 

## 2021-02-15 ENCOUNTER — Other Ambulatory Visit: Payer: Self-pay | Admitting: Family Medicine

## 2021-02-15 DIAGNOSIS — J309 Allergic rhinitis, unspecified: Secondary | ICD-10-CM

## 2021-02-15 DIAGNOSIS — Z889 Allergy status to unspecified drugs, medicaments and biological substances status: Secondary | ICD-10-CM

## 2021-02-20 ENCOUNTER — Ambulatory Visit (INDEPENDENT_AMBULATORY_CARE_PROVIDER_SITE_OTHER): Payer: Medicare Other | Admitting: Family Medicine

## 2021-02-20 ENCOUNTER — Other Ambulatory Visit: Payer: Self-pay

## 2021-02-20 ENCOUNTER — Encounter: Payer: Self-pay | Admitting: Family Medicine

## 2021-02-20 ENCOUNTER — Other Ambulatory Visit: Payer: Self-pay | Admitting: Family Medicine

## 2021-02-20 VITALS — BP 112/72 | HR 98 | Temp 98.2°F | Resp 16 | Ht <= 58 in | Wt 108.7 lb

## 2021-02-20 DIAGNOSIS — R21 Rash and other nonspecific skin eruption: Secondary | ICD-10-CM

## 2021-02-20 MED ORDER — VALACYCLOVIR HCL 1 G PO TABS: 1000.0000 mg | ORAL_TABLET | Freq: Three times a day (TID) | ORAL | 0 refills | Status: AC

## 2021-02-20 MED ORDER — PREDNISONE 20 MG PO TABS: 40.0000 mg | ORAL_TABLET | Freq: Every day | ORAL | 0 refills | Status: AC

## 2021-02-20 NOTE — Patient Instructions (Signed)
It was great to see you!  Our plans for today:  - Take the antiviral and steroid as prescribed. - Come back if no better after treatment.  - See your eye doctor if you start having symptoms in your eye.   Take care and seek immediate care sooner if you develop any concerns.   Dr. Ky Barban

## 2021-02-20 NOTE — Progress Notes (Signed)
   SUBJECTIVE:   CHIEF COMPLAINT / HPI:   RASH - seen previously for similar 10/10 and prescribed valtrex and prednisone but did not take.  Duration:   3 days   Location: face  Itching: yes Burning: yes Redness: yes Oozing: no Scaling:  a little Blisters: yes Painful: no Fevers: no Change in detergents/soaps/personal care products: no Recent illness: no Recent travel:no History of same: yes Context: worse Alleviating factors: hydrocortisone cream Treatments attempted:cortisone cream Shortness of breath:  a little more than usual   Throat/tongue swelling: no Myalgias/arthralgias: no Has one dog.   OBJECTIVE:   BP 112/72   Pulse 98   Temp 98.2 F (36.8 C)   Resp 16   Ht 4\' 10"  (1.473 m)   Wt 108 lb 11.2 oz (49.3 kg)   SpO2 98%   BMI 22.72 kg/m   Gen: well appearing, in NAD Skin: erythematous patch to central forehead, no blisters or pustules visible.    ASSESSMENT/PLAN:   Rash Reports similar to prior shingles outbreak although per chart review has h/o multiple similar presentations. Question actually rosacea vs shingles. Will provide short course of steroid and antiviral given reported symptoms consistent with shingles. If no improvement, recommend reevaluation for care, at that time would recommend treatment for rosacea.    Myles Gip, DO

## 2021-02-27 ENCOUNTER — Ambulatory Visit: Payer: Self-pay

## 2021-02-27 ENCOUNTER — Telehealth: Payer: Self-pay | Admitting: Family Medicine

## 2021-02-27 NOTE — Telephone Encounter (Signed)
Patient called to advised per Dr. Ky Barban last OV note to follow up is no improvement. Patient says there is some improvement in the rash over the eyes and under the eyes, says if she could get another round of medication she feels that would clear it up. She says she's afraid of it going into her eyes. She says there is a spot on her back that she's not able to see, but can feel it there. I advised an appointment, she says there is no way to get there and she is not able to do a virtual. I advised I will send this request to Dr. Ky Barban and someone will call with her recommendation of refilling Valacyclovir and Prednisone.

## 2021-02-27 NOTE — Telephone Encounter (Signed)
Medication Refill - Medication:  valACYclovir (VALTREX) 1000 MG tablet  predniSONE (DELTASONE) 20 MG tablet   Has the patient contacted their pharmacy? Yes Contact PCP  Preferred Pharmacy (with phone number or street name):  [id number] - [place], [place] [address]  [address], [place] [address]  Phone:  [phone]  Fax:  [phone]   Has the patient been seen for an appointment in the last year OR does the patient have an upcoming appointment? Yes.    *Pt stated that she recently got her shingles shot and was seen about the rashes around her eyes, pt stated it is helping a lot she just needs a few more of the medication*   Agent: Please be advised that RX refills may take up to 3 business days. We ask that you follow-up with your pharmacy.

## 2021-02-27 NOTE — Telephone Encounter (Addendum)
Patient called for update on refills of prednisone and valtrex.  Pt is very upset that she was denied refills. She states that she has had shingles 2 other times and is sure that she needs these medications She is also afraid of going blind as the rash is heading towards her eye.   Patient has transportation issues,and may not be able to come in for a visit.  I was able to schedule [name] for an appointment with Dr. Ancil Boozer for tomorrow.

## 2021-02-28 ENCOUNTER — Other Ambulatory Visit: Payer: Self-pay

## 2021-02-28 ENCOUNTER — Ambulatory Visit (INDEPENDENT_AMBULATORY_CARE_PROVIDER_SITE_OTHER): Payer: Medicare Other | Admitting: Family Medicine

## 2021-02-28 ENCOUNTER — Encounter: Payer: Self-pay | Admitting: Family Medicine

## 2021-02-28 VITALS — BP 126/78 | HR 65 | Temp 97.6°F | Resp 16 | Ht 59.0 in | Wt 107.0 lb

## 2021-02-28 DIAGNOSIS — R21 Rash and other nonspecific skin eruption: Secondary | ICD-10-CM

## 2021-02-28 MED ORDER — DESONIDE 0.05 % EX CREA: TOPICAL_CREAM | Freq: Two times a day (BID) | CUTANEOUS | 0 refills | Status: DC

## 2021-02-28 NOTE — Telephone Encounter (Signed)
Seen today by sowles, referred to derm

## 2021-02-28 NOTE — Progress Notes (Signed)
Name: Angela Kelly   MRN: [id number]    DOB: 28-Aug-1945   Date:02/28/2021       Progress Note  Subjective  Chief Complaint  Medication Refill/ Rash  HPI  Rash: she has a history of shingles. States two weeks ago she developed a rash on forehead, that progressed to  her cheeks, she was very concerned about shingles since she had two episodes in the past and had a virtual visit. She was given prednisone and also valtrex but rash is still present . She sates the prickly sensation improves with loratadine orally, face is not as red. She also has a history of rosacea but has not been using metrogel . No fever, chills, nausea , vomiting, or change in appetite  Patient Active Problem List   Diagnosis Date Noted   Side pain 10/10/2020   Gallstones 05/15/2018   Aortic atherosclerosis ([place]) 05/06/2018   Thoracogenic scoliosis of thoracic region 10/16/2017   Gall bladder polyp 09/17/2017   Essential hypertension 07/31/2017   GERD (gastroesophageal reflux disease) 07/31/2017   Full code status 07/31/2017   Myalgia due to HMG CoA reductase inhibitor 07/31/2017   Abnormal mammogram of left breast 01/18/2015   OSA (obstructive sleep apnea)    Hyperlipidemia    Thyroid disease    Cataract of both eyes    Ocular rosacea    Bunion    Hammer toe    Allergic rhinitis    Hypothyroidism 08/03/2014   Left thyroid nodule 08/03/2014   IFG (impaired fasting glucose) 08/03/2014   Osteopenia 08/03/2014   SOB (shortness of breath) 11/05/2013   Anemia, unspecified 08/21/2013   Depression 08/21/2013   Osteoarthritis 08/21/2013   Osteoporosis 08/21/2013    Past Surgical History:  Procedure Laterality Date   BACK SURGERY     lumbar laminectomy   BIOPSY THYROID     BREAST CYST ASPIRATION Left    BREAST SURGERY     BUNIONECTOMY Right    COLONOSCOPY WITH PROPOFOL N/A 11/27/2017   Procedure: COLONOSCOPY WITH PROPOFOL;  Surgeon: Virgel Manifold, MD;  Location: [facility] ENDOSCOPY;  Service: Endoscopy;   Laterality: N/A;   EYE SURGERY     SPINE SURGERY      Family History  Problem Relation Age of Onset   Asthma Mother    Hypertension Mother    Diabetes Mother    Cancer Mother        Leukemia   Peripheral vascular disease Mother    Osteoporosis Mother    Glaucoma Mother    Arthritis Mother    Varicose Veins Mother    Heart disease Father        coronary artery occlusion   Early death Father    Heart attack Father    Hypertension Sister    Diabetes Sister    Glaucoma Sister    Asthma Sister    Hearing loss Sister    Kidney disease Sister    Vision loss Sister    Varicose Veins Sister    Diabetes Maternal Grandmother    Heart disease Maternal Grandmother    Colon cancer Maternal Uncle    Lung cancer Paternal Uncle    Early death Paternal Uncle    Diabetes Maternal Grandfather     Social History   Tobacco Use   Smoking status: Never   Smokeless tobacco: Never   Tobacco comments:    smoking cesaation materials not required  Substance Use Topics   Alcohol use: Yes    Alcohol/week: 0.0 standard  drinks    Comment: Wine (very rarely).     Current Outpatient Medications:    acetaminophen (TYLENOL) 500 MG tablet, Take 500 mg by mouth every 6 (six) hours as needed., Disp: , Rfl:    aspirin EC 81 MG tablet, Take 1 tablet (81 mg total) by mouth daily. (Patient taking differently: Take 81 mg by mouth at bedtime.), Disp: 30 tablet, Rfl: 11   calcium carbonate (TUMS - DOSED IN MG ELEMENTAL CALCIUM) 500 MG chewable tablet, Chew 1-3 tablets by mouth daily as needed for indigestion or heartburn., Disp: , Rfl:    ciclesonide (OMNARIS) 50 MCG/ACT nasal spray, Place into the nose., Disp: , Rfl:    desonide (DESOWEN) 0.05 % cream, Apply topically 2 (two) times daily., Disp: 30 g, Rfl: 0   diphenhydrAMINE (BENADRYL) 25 mg capsule, Take 25 mg by mouth See admin instructions. Take 25 mg every night and may take an additional 25 mg dose during the day as needed for allergies, Disp: ,  Rfl:    doxycycline (VIBRAMYCIN) 50 MG capsule, Take 50 mg by mouth daily. , Disp: , Rfl: 3   hydrocortisone cream 1 %, Apply 1 application topically 2 (two) times daily., Disp: , Rfl:    levothyroxine (SYNTHROID) 75 MCG tablet, Take 1 tablet (75 mcg total) by mouth daily before breakfast., Disp: 90 tablet, Rfl: 3   metroNIDAZOLE (METROGEL) 1 % gel, Apply 1 application topically daily., Disp: , Rfl:    mometasone (NASONEX) 50 MCG/ACT nasal spray, PLACE 2 SPRAYS INTO THE NOSE DAILY., Disp: 51 each, Rfl: 1   Multiple Vitamin (MULTIVITAMIN) capsule, Take by mouth., Disp: , Rfl:    PAZEO 0.7 % SOLN, Place 1 drop into both eyes at bedtime. , Disp: , Rfl: 5   Probiotic Product (PROBIOTIC DAILY PO), Take 1 capsule by mouth daily as needed (when taking antibiotics). , Disp: , Rfl:   Allergies  Allergen Reactions   Nickel Rash    Almost went blind when wearing nickel eye glasses   Codeine Other (See Comments)    Ears ringing   Lovastatin Rash   Tape Rash    I personally reviewed active problem list, medication list, allergies, family history, social history, health maintenance with the patient/caregiver today.   ROS  Ten systems reviewed and is negative except as mentioned in HPI   Objective  Vitals:   02/28/21 1059  BP: 126/78  Pulse: 65  Resp: 16  Temp: 97.6 F (36.4 C)  SpO2: 96%  Weight: 107 lb (48.5 kg)  Height: 4\' 11"  (1.499 m)    Body mass index is 21.61 kg/m.  Physical Exam  Constitutional: Patient appears well-developed and well-nourished.  No distress.  HEENT: head atraumatic, normocephalic, pupils equal and reactive to light, neck supple Cardiovascular: Normal rate, regular rhythm and normal heart sounds.  No murmur heard. No BLE edema. Pulmonary/Chest: Effort normal and breath sounds normal. No respiratory distress. Abdominal: Soft.  There is no tenderness. Skin: erythematous rash on forehead and cheeks. No blisters, explained not shingles, crosses dermatomes and  also no vesicles or crusting. Pictures attached  Psychiatric: Patient has a normal mood and affect. behavior is normal. Judgment and thought content normal.    PHQ2/9: Depression screen [facility] 2/9 02/28/2021 02/20/2021 01/23/2021 09/08/2020 07/04/2020  Decreased Interest 0 0 0 0 3  Down, Depressed, Hopeless 0 0 0 3 3  PHQ - 2 Score 0 0 0 3 6  Altered sleeping 0 0 0 0 -  Tired, decreased energy 3  0 0 0 1  Change in appetite 0 0 0 0 1  Feeling bad or failure about yourself  0 0 0 0 0  Trouble concentrating 0 0 0 0 0  Moving slowly or fidgety/restless 0 0 0 0 0  Suicidal thoughts 0 0 0 0 0  PHQ-9 Score 3 0 0 3 -  Difficult doing work/chores - Not difficult at all Not difficult at all Not difficult at all Somewhat difficult  Some recent data might be hidden    phq 9 is negative   Fall Risk: Fall Risk  02/28/2021 02/20/2021 01/23/2021 09/08/2020 07/04/2020  Falls in the past year? 0 0 0 0 0  Number falls in past yr: 0 0 0 0 0  Injury with Fall? 0 0 0 0 0  Risk for fall due to : No Fall Risks - - No Fall Risks -  Risk for fall due to: Comment - - - - -  Follow up Falls prevention discussed - - Falls prevention discussed -      Functional Status Survey: Is the patient deaf or have difficulty hearing?: No Does the patient have difficulty seeing, even when wearing glasses/contacts?: Yes Does the patient have difficulty concentrating, remembering, or making decisions?: No Does the patient have difficulty walking or climbing stairs?: No Does the patient have difficulty dressing or bathing?: No Does the patient have difficulty doing errands alone such as visiting a doctor's office or shopping?: No    Assessment & Plan  1. Facial rash  - desonide (DESOWEN) 0.05 % cream; Apply topically 2 (two) times daily.  Dispense: 30 g; Refill: 0 - Ambulatory referral to Dermatology    Advised to resume metrogel and go back to Dermatologist

## 2021-03-01 ENCOUNTER — Ambulatory Visit: Payer: Medicare Other | Admitting: Family Medicine

## 2021-03-02 ENCOUNTER — Ambulatory Visit (INDEPENDENT_AMBULATORY_CARE_PROVIDER_SITE_OTHER): Payer: Medicare Other | Admitting: Dermatology

## 2021-03-02 ENCOUNTER — Other Ambulatory Visit: Payer: Self-pay

## 2021-03-02 DIAGNOSIS — L239 Allergic contact dermatitis, unspecified cause: Secondary | ICD-10-CM | POA: Diagnosis not present

## 2021-03-02 NOTE — Patient Instructions (Addendum)
If You Need Anything After Your Visit  If you have any questions or concerns for your doctor, please call our main line at [phone] and press option 4 to reach your doctor's medical assistant. If no one answers, please leave a voicemail as directed and we will return your call as soon as possible. Messages left after 4 pm will be answered the following business day.   You may also send us a message via MyChart. We typically respond to MyChart messages within 1-2 business days.  For prescription refills, please ask your pharmacy to contact our office. Our fax number is [phone].  If you have an urgent issue when the clinic is closed that cannot wait until the next business day, you can page your doctor at the number below.    Please note that while we do our best to be available for urgent issues outside of office hours, we are not available 24/7.   If you have an urgent issue and are unable to reach us, you may choose to seek medical care at your doctor's office, retail clinic, urgent care center, or emergency room.  If you have a medical emergency, please immediately call 911 or go to the emergency department.  Pager Numbers  - Dr. Kowalski: [phone]  - Dr. Moye: [phone]  - Dr. Stewart: [phone]  In the event of inclement weather, please call our main line at [phone] for an update on the status of any delays or closures.  Dermatology Medication Tips: Please keep the boxes that topical medications come in in order to help keep track of the instructions about where and how to use these. Pharmacies typically print the medication instructions only on the boxes and not directly on the medication tubes.   If your medication is too expensive, please contact our office at [phone] option 4 or send us a message through MyChart.   We are unable to tell what your co-pay for medications will be in advance as this is different depending on your insurance coverage.  However, we may be able to find a substitute medication at lower cost or fill out paperwork to get insurance to cover a needed medication.   If a prior authorization is required to get your medication covered by your insurance company, please allow us 1-2 business days to complete this process.  Drug prices often vary depending on where the prescription is filled and some pharmacies may offer cheaper prices.  The website www.goodrx.com contains coupons for medications through different pharmacies. The prices here do not account for what the cost may be with help from insurance (it may be cheaper with your insurance), but the website can give you the price if you did not use any insurance.  - You can print the associated coupon and take it with your prescription to the pharmacy.  - You may also stop by our office during regular business hours and pick up a GoodRx coupon card.  - If you need your prescription sent electronically to a different pharmacy, notify our office through [place] MyChart or by phone at [phone] option 4.     Si Usted Necesita Algo Despus de Su Visita  Tambin puede enviarnos un mensaje a travs de MyChart. Por lo general respondemos a los mensajes de MyChart en el transcurso de 1 a 2 das hbiles.  Para renovar recetas, por favor pida a su farmacia que se ponga en contacto con nuestra oficina. Nuestro nmero de fax es el [phone].  Si tiene   un asunto urgente cuando la clnica est cerrada y que no puede esperar hasta el siguiente da hbil, puede llamar/localizar a su doctor(a) al nmero que aparece a continuacin.   Por favor, tenga en cuenta que aunque hacemos todo lo posible para estar disponibles para asuntos urgentes fuera del horario de [place], no estamos disponibles las 24 horas del da, los 7 das de la [place].   Si tiene un problema urgente y no puede comunicarse con nosotros, puede optar por buscar atencin mdica  en el consultorio de su  doctor(a), en una clnica privada, en un centro de atencin urgente o en una sala de emergencias.  Si tiene Engineering geologist, por favor llame inmediatamente al 911 o vaya a la sala de emergencias.  Nmeros de bper  - Dr. Nehemiah Massed: [phone]  - Dra. Moye: [phone]  - Dra. Nicole Kindred: [phone]  En caso de inclemencias del [place], por favor llame a Johnsie Kindred principal al [phone] para una actualizacin sobre el [place] de cualquier retraso o cierre.  Consejos para la medicacin en dermatologa: Por favor, guarde las cajas en las que vienen los medicamentos de uso tpico para ayudarle a seguir las instrucciones sobre dnde y cmo usarlos. Las farmacias generalmente imprimen las instrucciones del medicamento slo en las cajas y no directamente en los tubos del [place].   Si su medicamento es muy caro, por favor, pngase en contacto con Zigmund Daniel llamando al [phone] y presione la opcin 4 o envenos un mensaje a travs de Pharmacist, community.   No podemos decirle cul ser su copago por los medicamentos por adelantado ya que esto es diferente dependiendo de la cobertura de su seguro. Sin embargo, es posible que podamos encontrar un medicamento sustituto a Electrical engineer un formulario para que el seguro cubra el medicamento que se considera necesario.   Si se requiere una autorizacin previa para que su compaa de seguros [place] su medicamento, por favor permtanos de 1 a 2 das hbiles para completar este proceso.  Los precios de los medicamentos varan con frecuencia dependiendo del Environmental consultant de dnde se surte la receta y alguna farmacias pueden ofrecer precios ms baratos.  El sitio web www.goodrx.com tiene cupones para medicamentos de Airline pilot. Los precios aqu no tienen en cuenta lo que podra costar con la ayuda del seguro (puede ser ms barato con su seguro), pero el sitio web puede darle el precio si no utiliz Research scientist (physical sciences).  - Puede imprimir el cupn  correspondiente y llevarlo con su receta a la farmacia.  - Tambin puede pasar por nuestra oficina durante el horario de atencin regular y Charity fundraiser una tarjeta de cupones de GoodRx.  - Si necesita que su receta se enve electrnicamente a una farmacia diferente, informe a nuestra oficina a travs de MyChart de [place] o por telfono llamando al [phone] y presione la opcin 4.   Start Desonide cream 1 to 2 times a dau up to 2 weeks to forehead

## 2021-03-02 NOTE — Progress Notes (Signed)
   New Patient Visit  Subjective  Angela Kelly is a 75 y.o. female who presents for the following: rash (Forehead, 4-5 weeks, itchy, blisters when it started, pts PCP thought it was shingles at first, valtrex and prednisone 7 day taper, Doxycycline 50mg  1 po qd). New patient referral from Delsa Grana, PA-C  The following portions of the chart were reviewed this encounter and updated as appropriate:   Tobacco  Allergies  Meds  Problems  Med Hx  Surg Hx  Fam Hx     Review of Systems:  No other skin or systemic complaints except as noted in HPI or Assessment and Plan.  Objective  Well appearing patient in no apparent distress; mood and affect are within normal limits.  A focused examination was performed including face. Relevant physical exam findings are noted in the Assessment and Plan.  forehead Mild pinkness of mid forehead that became a little pinker when it was rubbed.      Assessment & Plan  Allergic contact dermatitis, unspecified trigger forehead Unknown etiology Improving from previous prednisone taper  Start Desonide cream qd/bid up to 2 weeks, pt has script at home  Return if symptoms worsen or fail to improve.  I, Othelia Pulling, RMA, am acting as scribe for Sarina Ser, MD . Documentation: I have reviewed the above documentation for accuracy and completeness, and I agree with the above.  Sarina Ser, MD

## 2021-03-17 ENCOUNTER — Encounter: Payer: Self-pay | Admitting: Dermatology

## 2021-04-18 ENCOUNTER — Other Ambulatory Visit: Payer: Self-pay | Admitting: Family Medicine

## 2021-04-18 DIAGNOSIS — J309 Allergic rhinitis, unspecified: Secondary | ICD-10-CM

## 2021-04-18 DIAGNOSIS — Z889 Allergy status to unspecified drugs, medicaments and biological substances status: Secondary | ICD-10-CM

## 2021-06-06 ENCOUNTER — Other Ambulatory Visit: Payer: Self-pay | Admitting: Family Medicine

## 2021-06-06 DIAGNOSIS — R21 Rash and other nonspecific skin eruption: Secondary | ICD-10-CM

## 2021-06-06 NOTE — Telephone Encounter (Signed)
Called pt to get her scheduled and she states she has to find a ride and she will call back to schedule

## 2021-06-13 ENCOUNTER — Ambulatory Visit (INDEPENDENT_AMBULATORY_CARE_PROVIDER_SITE_OTHER): Payer: Medicare Other | Admitting: Internal Medicine

## 2021-06-13 ENCOUNTER — Encounter: Payer: Self-pay | Admitting: Internal Medicine

## 2021-06-13 ENCOUNTER — Other Ambulatory Visit: Payer: Self-pay | Admitting: Family Medicine

## 2021-06-13 ENCOUNTER — Other Ambulatory Visit: Payer: Self-pay

## 2021-06-13 VITALS — BP 122/78 | HR 113 | Temp 97.9°F | Resp 16 | Ht 59.0 in | Wt 107.4 lb

## 2021-06-13 DIAGNOSIS — R21 Rash and other nonspecific skin eruption: Secondary | ICD-10-CM

## 2021-06-13 DIAGNOSIS — E2839 Other primary ovarian failure: Secondary | ICD-10-CM

## 2021-06-13 DIAGNOSIS — Z889 Allergy status to unspecified drugs, medicaments and biological substances status: Secondary | ICD-10-CM

## 2021-06-13 DIAGNOSIS — E039 Hypothyroidism, unspecified: Secondary | ICD-10-CM

## 2021-06-13 DIAGNOSIS — Z23 Encounter for immunization: Secondary | ICD-10-CM | POA: Diagnosis not present

## 2021-06-13 DIAGNOSIS — Z1231 Encounter for screening mammogram for malignant neoplasm of breast: Secondary | ICD-10-CM

## 2021-06-13 DIAGNOSIS — Z13820 Encounter for screening for osteoporosis: Secondary | ICD-10-CM

## 2021-06-13 DIAGNOSIS — J309 Allergic rhinitis, unspecified: Secondary | ICD-10-CM

## 2021-06-13 DIAGNOSIS — E782 Mixed hyperlipidemia: Secondary | ICD-10-CM

## 2021-06-13 MED ORDER — DESONIDE 0.05 % EX CREA: TOPICAL_CREAM | Freq: Two times a day (BID) | CUTANEOUS | 0 refills | Status: DC

## 2021-06-13 MED ORDER — DOXYCYCLINE HYCLATE 50 MG PO CAPS: 50.0000 mg | ORAL_CAPSULE | Freq: Every day | ORAL | 3 refills | Status: DC

## 2021-06-13 MED ORDER — METRONIDAZOLE 1 % EX GEL: 1.0000 "application " | Freq: Every day | CUTANEOUS | 1 refills | Status: DC

## 2021-06-13 MED ORDER — LEVOTHYROXINE SODIUM 75 MCG PO TABS: 75.0000 ug | ORAL_TABLET | Freq: Every day | ORAL | 3 refills | Status: DC

## 2021-06-13 MED ORDER — ROSUVASTATIN CALCIUM 5 MG PO TABS: 5.0000 mg | ORAL_TABLET | Freq: Every day | ORAL | 3 refills | Status: DC

## 2021-06-13 NOTE — Assessment & Plan Note (Signed)
Reviewed last lipid panel with the patient, decided to try low dose Crestor to improve LDL and risk scores. Recheck in 6 months.

## 2021-06-13 NOTE — Assessment & Plan Note (Signed)
TSH from last year stable, refill Levothyroxine today. Plan to recheck labs in 6 months.

## 2021-06-13 NOTE — Progress Notes (Signed)
Established Patient Office Visit  Subjective:  Patient ID: Angela Kelly, female    DOB: [birth year]  Age: 76 y.o. MRN: [id number]  CC:  Chief Complaint  Patient presents with   Follow-up   Medication Refill    HPI Angela Kelly presents for follow up on chronic medical conditions.   Facial Rash: -Has allergic type rash on face as well as rosacea, also history of occular Shingles -Uses  Desonide cream, Metrogel and Doxy 50 mg PO daily  -Does not follow with Derm currently   Hypothyroidism: -Medications: Levothyroxine 75  -Patient is compliant with the above medication (s) at the above dose and reports no medication side effects.  -Denies weight changes, cold./heat intolerance, skin changes, anxiety/palpitations  -Last TSH: 0.60 3/22  HLD: -Medications: None currently, history of worsening of facial rash with Lovastatin in the past -Family history of cardiovascular disease, father passed away at 1 due to MI -Last lipid panel: Lipid Panel     Component Value Date/Time   CHOL 252 (H) 07/04/2020 1530   CHOL 269 (H) 08/05/2015 1101   TRIG 145 07/04/2020 1530   HDL 54 07/04/2020 1530   HDL 70 08/05/2015 1101   CHOLHDL 4.7 07/04/2020 1530   VLDL 25 08/20/2016 0943   LDLCALC 169 (H) 07/04/2020 1530   LABVLDL 33 08/05/2015 1101   The 10-year ASCVD risk score (Arnett DK, et al., 2019) is: 19.2%   Values used to calculate the score:     Age: 65 years     Sex: Female     Is Non-Hispanic African American: No     Diabetic: No     Tobacco smoker: No     Systolic Blood Pressure: 294 mmHg     Is BP treated: No     HDL Cholesterol: 54 mg/dL     Total Cholesterol: 252 mg/dL  Health Maintenance: -Blood work up to date -Mammogram 3/19 - Birads 1 -DEXA 01/2017 - right femur -2.0, -0.9 - drinks dairy, no calcium supplements, no vitamin D supplement currently  -Colonoscopy 2019, repeat in 5 years   Past Medical History:  Diagnosis Date   Allergic rhinitis    Allergy     Anemia    Arthritis    Bilateral ovarian cysts    laterality not known   Bunion    Cataract of both eyes    Complication of anesthesia    Bridge x 2 perm., crowns front    Deviated septum    Difficult intubation    Elevated BP    Environmental and seasonal allergies    Essential hypertension    Fibrocystic breast    GERD (gastroesophageal reflux disease)    Hammer toe    History of closed fracture of nasal bones    History of pneumonia    in her 29's   Hyperlipidemia    IFG (impaired fasting glucose)    Ocular rosacea    see's Kerr   OSA (obstructive sleep apnea)    Osteoarthritis cervical spine    Osteoarthritis of both hands    Osteoporosis    Osteoporosis    Rosacea    telengiectatic   Shingles    left eye   Sleep apnea    Thyroid disease    Thyroid nodule    left, sees Endocrinology   Vitamin D deficiency disease     Past Surgical History:  Procedure Laterality Date   BACK SURGERY     lumbar laminectomy  BIOPSY THYROID     BREAST CYST ASPIRATION Left    BREAST SURGERY     BUNIONECTOMY Right    COLONOSCOPY WITH PROPOFOL N/A 11/27/2017   Procedure: COLONOSCOPY WITH PROPOFOL;  Surgeon: Virgel Manifold, MD;  Location: [facility] ENDOSCOPY;  Service: Endoscopy;  Laterality: N/A;   EYE SURGERY     SPINE SURGERY      Family History  Problem Relation Age of Onset   Asthma Mother    Hypertension Mother    Diabetes Mother    Cancer Mother        Leukemia   Peripheral vascular disease Mother    Osteoporosis Mother    Glaucoma Mother    Arthritis Mother    Varicose Veins Mother    Heart disease Father        coronary artery occlusion   Early death Father    Heart attack Father    Hypertension Sister    Diabetes Sister    Glaucoma Sister    Asthma Sister    Hearing loss Sister    Kidney disease Sister    Vision loss Sister    Varicose Veins Sister    Diabetes Maternal Grandmother    Heart disease Maternal Grandmother    Colon cancer  Maternal Uncle    Lung cancer Paternal Uncle    Early death Paternal Uncle    Diabetes Maternal Grandfather     Social History   Socioeconomic History   Marital status: Divorced    Spouse name: Not on file   Number of children: 3   Years of education: some college   Highest education level: 12th grade  Occupational History   Occupation: Retired  Tobacco Use   Smoking status: Never   Smokeless tobacco: Never   Tobacco comments:    smoking Gaffer not required  Vaping Use   Vaping Use: Never used  Substance and Sexual Activity   Alcohol use: Yes    Alcohol/week: 0.0 standard drinks    Comment: Wine (very rarely).   Drug use: No   Sexual activity: Not Currently  Other Topics Concern   Not on file  Social History Narrative   Pt's son lives with her.    Social Determinants of Health   Financial Resource Strain: Low Risk    Difficulty of Paying Living Expenses: Not hard at all  Food Insecurity: No Food Insecurity   Worried About Charity fundraiser in the Last Year: Never true   [place] in the Last Year: Never true  Transportation Needs: No Transportation Needs   Lack of Transportation (Medical): No   Lack of Transportation (Non-Medical): No  Physical Activity: Insufficiently Active   Days of Exercise per Week: 2 days   Minutes of Exercise per Session: 20 min  Stress: Stress Concern Present   Feeling of Stress : To some extent  Social Connections: Socially Isolated   Frequency of Communication with Friends and Family: More than three times a week   Frequency of Social Gatherings with Friends and Family: Twice a week   Attends Religious Services: Never   Marine scientist or Organizations: No   Attends Music therapist: Never   Marital Status: Divorced  Human resources officer Violence: Not At Risk   Fear of Current or Ex-Partner: No   Emotionally Abused: No   Physically Abused: No   Sexually Abused: No    Outpatient Medications  Prior to Visit  Medication Sig Dispense Refill  acetaminophen (TYLENOL) 500 MG tablet Take 500 mg by mouth every 6 (six) hours as needed.     aspirin EC 81 MG tablet Take 1 tablet (81 mg total) by mouth daily. (Patient taking differently: Take 81 mg by mouth at bedtime.) 30 tablet 11   calcium carbonate (TUMS - DOSED IN MG ELEMENTAL CALCIUM) 500 MG chewable tablet Chew 1-3 tablets by mouth daily as needed for indigestion or heartburn.     ciclesonide (OMNARIS) 50 MCG/ACT nasal spray Place into the nose.     desonide (DESOWEN) 0.05 % cream Apply topically 2 (two) times daily. 30 g 0   diphenhydrAMINE (BENADRYL) 25 mg capsule Take 25 mg by mouth See admin instructions. Take 25 mg every night and may take an additional 25 mg dose during the day as needed for allergies     doxycycline (VIBRAMYCIN) 50 MG capsule Take 50 mg by mouth daily.   3   hydrocortisone cream 1 % Apply 1 application topically 2 (two) times daily.     levothyroxine (SYNTHROID) 75 MCG tablet Take 1 tablet (75 mcg total) by mouth daily before breakfast. 90 tablet 3   metroNIDAZOLE (METROGEL) 1 % gel Apply 1 application topically daily.     mometasone (NASONEX) 50 MCG/ACT nasal spray PLACE 2 SPRAYS INTO THE NOSE DAILY. 51 each 1   Multiple Vitamin (MULTIVITAMIN) capsule Take by mouth.     PAZEO 0.7 % SOLN Place 1 drop into both eyes at bedtime.   5   Probiotic Product (PROBIOTIC DAILY PO) Take 1 capsule by mouth daily as needed (when taking antibiotics).      No facility-administered medications prior to visit.    Allergies  Allergen Reactions   Nickel Rash    Almost went blind when wearing nickel eye glasses   Codeine Other (See Comments)    Ears ringing   Lovastatin Rash   Tape Rash    ROS Review of Systems  Constitutional:  Negative for chills and fever.  Eyes:  Negative for visual disturbance.  Respiratory:  Negative for cough and shortness of breath.   Cardiovascular:  Negative for chest pain.   Gastrointestinal:  Negative for abdominal pain.  Skin:  Positive for rash.     Objective:    Physical Exam Constitutional:      Appearance: Normal appearance.  HENT:     Head: Normocephalic and atraumatic.  Eyes:     Conjunctiva/sclera: Conjunctivae normal.  Cardiovascular:     Rate and Rhythm: Normal rate and regular rhythm.  Pulmonary:     Effort: Pulmonary effort is normal.     Breath sounds: Normal breath sounds.  Musculoskeletal:     Right lower leg: No edema.     Left lower leg: No edema.  Skin:    General: Skin is warm and dry.     Findings: Rash present.     Comments: Facial rash consistent with rosacea present  Neurological:     General: No focal deficit present.     Mental Status: She is alert. Mental status is at baseline.  Psychiatric:        Mood and Affect: Mood normal.        Behavior: Behavior normal.    BP 122/78    Pulse (!) 113    Temp 97.9 F (36.6 C)    Resp 16    Ht 4\' 11"  (1.499 m)    Wt 107 lb 6.4 oz (48.7 kg)    SpO2 97%  BMI 21.69 kg/m  Wt Readings from Last 3 Encounters:  02/28/21 107 lb (48.5 kg)  02/20/21 108 lb 11.2 oz (49.3 kg)  01/23/21 107 lb 9.6 oz (48.8 kg)     Health Maintenance Due  Topic Date Due   Zoster Vaccines- Shingrix (1 of 2) Never done   MAMMOGRAM  06/27/2018   COVID-19 Vaccine (4 - Booster for Pfizer series) 04/20/2020   INFLUENZA VACCINE  11/14/2020    There are no preventive care reminders to display for this patient.  Lab Results  Component Value Date   TSH 0.60 07/04/2020   Lab Results  Component Value Date   WBC 8.6 10/10/2020   HGB 15.3 10/10/2020   HCT 46.6 (H) 10/10/2020   MCV 87.1 10/10/2020   PLT 395 10/10/2020   Lab Results  Component Value Date   NA 141 07/04/2020   K 4.8 07/04/2020   CO2 25 07/04/2020   GLUCOSE 86 07/04/2020   BUN 11 07/04/2020   CREATININE 0.76 07/04/2020   BILITOT 0.3 07/04/2020   ALKPHOS 79 08/20/2016   AST 17 07/04/2020   ALT 13 07/04/2020   PROT 6.5  07/04/2020   ALBUMIN 3.8 08/20/2016   CALCIUM 9.4 07/04/2020   ANIONGAP 3 (L) 10/25/2015   Lab Results  Component Value Date   CHOL 252 (H) 07/04/2020   Lab Results  Component Value Date   HDL 54 07/04/2020   Lab Results  Component Value Date   LDLCALC 169 (H) 07/04/2020   Lab Results  Component Value Date   TRIG 145 07/04/2020   Lab Results  Component Value Date   CHOLHDL 4.7 07/04/2020   Lab Results  Component Value Date   HGBA1C 5.4 07/04/2020      Assessment & Plan:   Problem List Items Addressed This Visit       Endocrine   Hypothyroidism    TSH from last year stable, refill Levothyroxine today. Plan to recheck labs in 6 months.      Relevant Medications   levothyroxine (SYNTHROID) 75 MCG tablet     Other   Hyperlipidemia    Reviewed last lipid panel with the patient, decided to try low dose Crestor to improve LDL and risk scores. Recheck in 6 months.       Relevant Medications   rosuvastatin (CRESTOR) 5 MG tablet   Other Visit Diagnoses     Need for influenza vaccination    -  Primary   Relevant Orders   Flu Vaccine QUAD High Dose(Fluad) (Completed)   Need for shingles vaccine       Relevant Orders   Varicella-zoster vaccine IM (Shingrix) (Completed)   Facial rash       Relevant Medications   metroNIDAZOLE (METROGEL) 1 % gel   desonide (DESOWEN) 0.05 % cream   doxycycline (VIBRAMYCIN) 50 MG capsule   Encounter for screening mammogram for malignant neoplasm of breast       Relevant Orders   MM Digital Screening   Osteoporosis screening       Relevant Orders   DG Bone Density   Estrogen deficiency       Relevant Orders   DG Bone Density       Meds ordered this encounter  Medications   metroNIDAZOLE (METROGEL) 1 % gel    Sig: Apply 1 application topically daily.    Dispense:  45 g    Refill:  1   desonide (DESOWEN) 0.05 % cream    Sig: Apply topically 2 (  two) times daily.    Dispense:  30 g    Refill:  0   levothyroxine  (SYNTHROID) 75 MCG tablet    Sig: Take 1 tablet (75 mcg total) by mouth daily before breakfast.    Dispense:  90 tablet    Refill:  3   doxycycline (VIBRAMYCIN) 50 MG capsule    Sig: Take 1 capsule (50 mg total) by mouth daily.    Dispense:  90 capsule    Refill:  3   rosuvastatin (CRESTOR) 5 MG tablet    Sig: Take 1 tablet (5 mg total) by mouth daily.    Dispense:  90 tablet    Refill:  3    Follow-up: Return in about 6 months (around 12/11/2021).    Teodora Medici, DO

## 2021-06-13 NOTE — Patient Instructions (Addendum)
It was great seeing you today!  Plan discussed at today's visit: -Start taking new cholesterol medication, Crestor 5 mg daily - if any side effects stop and let me know -Increase dietary calcium and take Vitamin D over the counter 1000 IU daily  -Refills sent -Mammogram and bone scan ordered today    Follow up in: 6 months   Take care and let us know if you have any questions or concerns prior to your next visit.  Dr. Rosana Berger

## 2021-07-09 ENCOUNTER — Other Ambulatory Visit: Payer: Self-pay | Admitting: Internal Medicine

## 2021-07-09 DIAGNOSIS — Z889 Allergy status to unspecified drugs, medicaments and biological substances status: Secondary | ICD-10-CM

## 2021-07-09 DIAGNOSIS — J309 Allergic rhinitis, unspecified: Secondary | ICD-10-CM

## 2021-07-11 NOTE — Telephone Encounter (Signed)
Requested medication (s) are due for refill today: refills available ? ?Requested medication (s) are on the active medication list: yes   ? ?Last refill: 06/16/21  30 ml 2 refills ? ?Future visit scheduled  With nurse 09/12/21 ? ?Notes to clinic:DX code needed ? ?Requested Prescriptions  ?Pending Prescriptions Disp Refills  ? cromolyn (OPTICROM) 4 % ophthalmic solution [Pharmacy Med Name: CROMOLYN 4% EYE DROPS] 30 mL 1  ?  Sig: PLACE 1 DROP INTO BOTH EYES 4 (FOUR) TIMES DAILY AS NEEDED.  ?  ? Ophthalmology:  Koleen Nimrod - 07/09/2021 10:31 AM  ?  ?  Passed - Valid encounter within last 12 months  ?  Recent Outpatient Visits   ? ?      ? 4 weeks ago Need for influenza vaccination  ? [place], DO  ? 4 months ago Facial rash  ? [facility] [place], Drue Stager, MD  ? 4 months ago Rash  ? [place], DO  ? 5 months ago Rash and nonspecific skin eruption  ? [facility] [place], Kristeen Miss, [place]  ? 9 months ago RUQ pain  ? [place], DO  ? ?  ?  ?Future Appointments   ? ?        ? In 2 months [place]   ? ?  ? ?  ?  ?  ? ? ? ? ?

## 2021-07-14 ENCOUNTER — Other Ambulatory Visit: Payer: Self-pay | Admitting: Internal Medicine

## 2021-07-14 DIAGNOSIS — R21 Rash and other nonspecific skin eruption: Secondary | ICD-10-CM

## 2021-07-17 NOTE — Telephone Encounter (Signed)
Requested medication (s) are due for refill today: yes ? ?Requested medication (s) are on the active medication list: yes ? ?Last refill:  06/13/21 45g with 1 RF ? ?Future visit scheduled: 09/12/21 ? ?Notes to clinic: This medication does not have a protocol to follow, please assess. ? ? ?  ? ?Requested Prescriptions  ?Pending Prescriptions Disp Refills  ? metroNIDAZOLE (METROGEL) 1 % gel [Pharmacy Med Name: METRONIDAZOLE TOPICAL 1% GEL] 60 g 1  ?  Sig: Apply 1 application topically daily.  ?  ? Off-Protocol Failed - 07/14/2021  5:37 PM  ?  ?  Failed - Medication not assigned to a protocol, review manually.  ?  ?  Passed - Valid encounter within last 12 months  ?  Recent Outpatient Visits   ? ?      ? 1 month ago Need for influenza vaccination  ? [place], DO  ? 4 months ago Facial rash  ? [facility] [place], Drue Stager, MD  ? 4 months ago Rash  ? [place], DO  ? 5 months ago Rash and nonspecific skin eruption  ? [facility] [place], Kristeen Miss, [place]  ? 9 months ago RUQ pain  ? [place], DO  ? ?  ?  ?Future Appointments   ? ?        ? In 1 month [place]   ? ?  ? ?  ?  ?  ? ? ?

## 2021-07-19 ENCOUNTER — Telehealth: Payer: Self-pay | Admitting: Family Medicine

## 2021-07-19 DIAGNOSIS — E039 Hypothyroidism, unspecified: Secondary | ICD-10-CM

## 2021-07-19 NOTE — Telephone Encounter (Signed)
Copied from [place] [phone]. Topic: Quick Communication - Rx Refill/Question ?>> Jul 19, 2021  4:48 PM Yvette Rack wrote: ?Medication: levothyroxine (SYNTHROID) 75 MCG tablet ? ?Has the patient contacted their pharmacy? Yes.  Pt told to contact provider ?(Agent: If no, request that the patient contact the pharmacy for the refill. If patient does not wish to contact the pharmacy document the reason why and proceed with request.) ?(Agent: If yes, when and what did the pharmacy advise?) ? ?Preferred Pharmacy (with phone number or street name): [id number] [place] [address]  ?Phone: [phone]?Fax: [phone]? ?Has the patient been seen for an appointment in the last year OR does the patient have an upcoming appointment? Yes.   ? ?Agent: Please be advised that RX refills may take up to 3 business days. We ask that you follow-up with your pharmacy. ?

## 2021-07-20 ENCOUNTER — Other Ambulatory Visit: Payer: Self-pay | Admitting: Internal Medicine

## 2021-07-20 ENCOUNTER — Other Ambulatory Visit: Payer: Self-pay | Admitting: Family Medicine

## 2021-07-20 DIAGNOSIS — R21 Rash and other nonspecific skin eruption: Secondary | ICD-10-CM

## 2021-07-20 DIAGNOSIS — E039 Hypothyroidism, unspecified: Secondary | ICD-10-CM

## 2021-07-20 NOTE — Telephone Encounter (Signed)
Verified that refills are available. Called pt to let her know. ?Requested Prescriptions  ?Pending Prescriptions Disp Refills  ?? levothyroxine (SYNTHROID) 75 MCG tablet 90 tablet 3  ?  Sig: Take 1 tablet (75 mcg total) by mouth daily before breakfast.  ?  ? Endocrinology:  Hypothyroid Agents Failed - 07/20/2021  9:39 AM  ?  ?  Failed - TSH in normal range and within 360 days  ?  TSH  ?Date Value Ref Range Status  ?07/04/2020 0.60 0.40 - 4.50 mIU/L Final  ?   ?  ?  Passed - Valid encounter within last 12 months  ?  Recent Outpatient Visits   ?      ? 1 month ago Need for influenza vaccination  ? [place], DO  ? 4 months ago Facial rash  ? [facility] [employer], Drue Stager, MD  ? 5 months ago Rash  ? [place], DO  ? 5 months ago Rash and nonspecific skin eruption  ? [facility] [place], Kristeen Miss, [place]  ? 9 months ago RUQ pain  ? [place], DO  ?  ?  ?Future Appointments   ?        ? In 1 month [place]   ?  ? ?  ?  ?  ? ?

## 2021-07-20 NOTE — Telephone Encounter (Signed)
[facility] [place] [facility]. She will refill today. ? ?Called Pt. And informed her that refills available and will be refilled today. ?

## 2021-07-20 NOTE — Telephone Encounter (Signed)
Requested medication (s) are due for refill today: yes ? ?Requested medication (s) are on the active medication list: yes ? ?Last refill:  06/13/21 ? ?Future visit scheduled: no ? ?Notes to clinic:  med not delegated to NT to RF ? ? ?Requested Prescriptions  ?Pending Prescriptions Disp Refills  ? desonide (DESOWEN) 0.05 % cream [Pharmacy Med Name: DESONIDE 0.05% CREAM] 30 g 0  ?  Sig: APPLY TOPICALLY TWICE A DAY  ?  ? Not Delegated - Dermatology:  Corticosteroids Failed - 07/20/2021  9:09 AM  ?  ?  Failed - This refill cannot be delegated  ?  ?  Passed - Valid encounter within last 12 months  ?  Recent Outpatient Visits   ? ?      ? 1 month ago Need for influenza vaccination  ? [place], DO  ? 4 months ago Facial rash  ? [facility] [place], Drue Stager, MD  ? 5 months ago Rash  ? [place], DO  ? 5 months ago Rash and nonspecific skin eruption  ? [facility] [place], Kristeen Miss, [place]  ? 9 months ago RUQ pain  ? [place], DO  ? ?  ?  ?Future Appointments   ? ?        ? In 1 month [place]   ? ?  ? ?  ?  ?  ? ? ? ? ?

## 2021-09-12 ENCOUNTER — Ambulatory Visit: Payer: Medicare Other

## 2021-09-14 ENCOUNTER — Ambulatory Visit (INDEPENDENT_AMBULATORY_CARE_PROVIDER_SITE_OTHER): Payer: Medicare Other

## 2021-09-14 ENCOUNTER — Encounter: Payer: Self-pay | Admitting: Family Medicine

## 2021-09-14 ENCOUNTER — Ambulatory Visit (INDEPENDENT_AMBULATORY_CARE_PROVIDER_SITE_OTHER): Payer: Medicare Other | Admitting: Family Medicine

## 2021-09-14 VITALS — BP 110/72 | HR 99 | Temp 97.7°F | Resp 12 | Ht 59.0 in | Wt 108.5 lb

## 2021-09-14 DIAGNOSIS — Z9109 Other allergy status, other than to drugs and biological substances: Secondary | ICD-10-CM | POA: Diagnosis not present

## 2021-09-14 DIAGNOSIS — E039 Hypothyroidism, unspecified: Secondary | ICD-10-CM | POA: Diagnosis not present

## 2021-09-14 DIAGNOSIS — Z13820 Encounter for screening for osteoporosis: Secondary | ICD-10-CM

## 2021-09-14 DIAGNOSIS — Z780 Asymptomatic menopausal state: Secondary | ICD-10-CM | POA: Diagnosis not present

## 2021-09-14 DIAGNOSIS — Z5181 Encounter for therapeutic drug level monitoring: Secondary | ICD-10-CM | POA: Diagnosis not present

## 2021-09-14 DIAGNOSIS — I700 Atherosclerosis of aorta: Secondary | ICD-10-CM | POA: Diagnosis not present

## 2021-09-14 DIAGNOSIS — Z0000 Encounter for general adult medical examination without abnormal findings: Secondary | ICD-10-CM | POA: Diagnosis not present

## 2021-09-14 DIAGNOSIS — K219 Gastro-esophageal reflux disease without esophagitis: Secondary | ICD-10-CM

## 2021-09-14 DIAGNOSIS — M85859 Other specified disorders of bone density and structure, unspecified thigh: Secondary | ICD-10-CM | POA: Diagnosis not present

## 2021-09-14 DIAGNOSIS — E782 Mixed hyperlipidemia: Secondary | ICD-10-CM

## 2021-09-14 DIAGNOSIS — I10 Essential (primary) hypertension: Secondary | ICD-10-CM

## 2021-09-14 DIAGNOSIS — R21 Rash and other nonspecific skin eruption: Secondary | ICD-10-CM

## 2021-09-14 DIAGNOSIS — Z872 Personal history of diseases of the skin and subcutaneous tissue: Secondary | ICD-10-CM | POA: Diagnosis not present

## 2021-09-14 MED ORDER — LEVOTHYROXINE SODIUM 75 MCG PO TABS: 75.0000 ug | ORAL_TABLET | Freq: Every day | ORAL | 3 refills | Status: DC

## 2021-09-14 MED ORDER — ROSUVASTATIN CALCIUM 5 MG PO TABS: 5.0000 mg | ORAL_TABLET | Freq: Every day | ORAL | 3 refills | Status: DC

## 2021-09-14 NOTE — Progress Notes (Signed)
Name: Angela Kelly   MRN: [id number]    DOB: 1945/07/29   Date:09/14/2021       Progress Note  Chief Complaint  Patient presents with   Medication Refill     Subjective:   Angela Kelly is a 76 y.o. female, presents to clinic for routine f/up  Hyperlipidemia: Currently treated with Crestor 5 mg prescribed in the past, patient has not been taking it Last Lipids: Lab Results  Component Value Date   CHOL 252 (H) 07/04/2020   CHOL 201 (H) 12/26/2018   Lab Results  Component Value Date   HDL 54 07/04/2020   HDL 47 (L) 12/26/2018   Lab Results  Component Value Date   LDLCALC 169 (H) 07/04/2020   [place] 122 (H) 12/26/2018   Lab Results  Component Value Date   TRIG 145 07/04/2020   TRIG 207 (H) 12/26/2018   Lab Results  Component Value Date   CHOLHDL 4.7 07/04/2020   CHOLHDL 4.3 12/26/2018    - Denies: Chest pain, shortness of breath, myalgias, claudication  Hypothyroidism: History: hypothyroidism Current Medication Regimen: 75 mcg synthroid Takes medicine correctly in the morning on empty stomach Current Symptoms: denies fatigue, weight changes, heat/cold intolerance, bowel/skin changes or CVS symptoms Most recent results are below; we will be repeating labs today.  GERD - not on meds, rarely uses OTC if needed  Rash-in the chart there is a history of various different rashes and history of rosacea  Rhinitis/seasonal allergies, uses cromolyn drops to eyes when needed, Benadryl, Nasonex, symptoms currently well controlled  The chart there is a history of hypertension she is not on any medications and blood pressure is at goal today    Current Outpatient Medications:    acetaminophen (TYLENOL) 500 MG tablet, Take 500 mg by mouth every 6 (six) hours as needed., Disp: , Rfl:    aspirin EC 81 MG tablet, Take 1 tablet (81 mg total) by mouth daily. (Patient taking differently: Take 81 mg by mouth at bedtime.), Disp: 30 tablet, Rfl: 11   calcium carbonate (TUMS -  DOSED IN MG ELEMENTAL CALCIUM) 500 MG chewable tablet, Chew 1-3 tablets by mouth daily as needed for indigestion or heartburn., Disp: , Rfl:    ciclesonide (OMNARIS) 50 MCG/ACT nasal spray, Place into the nose., Disp: , Rfl:    cromolyn (OPTICROM) 4 % ophthalmic solution, PLACE 1 DROP INTO BOTH EYES 4 (FOUR) TIMES DAILY AS NEEDED., Disp: 30 mL, Rfl: 1   desonide (DESOWEN) 0.05 % cream, APPLY TOPICALLY TWICE A DAY, Disp: 30 g, Rfl: 0   diphenhydrAMINE (BENADRYL) 25 mg capsule, Take 25 mg by mouth See admin instructions. Take 25 mg every night and may take an additional 25 mg dose during the day as needed for allergies, Disp: , Rfl:    hydrocortisone cream 1 %, Apply 1 application topically 2 (two) times daily., Disp: , Rfl:    levothyroxine (SYNTHROID) 75 MCG tablet, Take 1 tablet (75 mcg total) by mouth daily before breakfast., Disp: 90 tablet, Rfl: 3   mometasone (NASONEX) 50 MCG/ACT nasal spray, PLACE 2 SPRAYS INTO THE NOSE DAILY., Disp: 51 each, Rfl: 1   Multiple Vitamin (MULTIVITAMIN) capsule, Take by mouth., Disp: , Rfl:    PAZEO 0.7 % SOLN, Place 1 drop into both eyes at bedtime. , Disp: , Rfl: 5   Probiotic Product (PROBIOTIC DAILY PO), Take 1 capsule by mouth daily as needed (when taking antibiotics). , Disp: , Rfl:    doxycycline (VIBRAMYCIN)  50 MG capsule, Take 1 capsule (50 mg total) by mouth daily. (Patient not taking: Reported on 09/14/2021), Disp: 90 capsule, Rfl: 3   rosuvastatin (CRESTOR) 5 MG tablet, Take 1 tablet (5 mg total) by mouth daily. (Patient not taking: Reported on 09/14/2021), Disp: 90 tablet, Rfl: 3  Patient Active Problem List   Diagnosis Date Noted   Side pain 10/10/2020   Gallstones 05/15/2018   Aortic atherosclerosis ([place]) 05/06/2018   Thoracogenic scoliosis of thoracic region 10/16/2017   Gall bladder polyp 09/17/2017   Essential hypertension 07/31/2017   GERD (gastroesophageal reflux disease) 07/31/2017   Full code status 07/31/2017   Myalgia due to HMG CoA  reductase inhibitor 07/31/2017   Abnormal mammogram of left breast 01/18/2015   OSA (obstructive sleep apnea)    Hyperlipidemia    Thyroid disease    Cataract of both eyes    Ocular rosacea    Bunion    Hammer toe    Allergic rhinitis    Hypothyroidism 08/03/2014   Left thyroid nodule 08/03/2014   IFG (impaired fasting glucose) 08/03/2014   Osteopenia 08/03/2014   SOB (shortness of breath) 11/05/2013   Anemia, unspecified 08/21/2013   Depression 08/21/2013   Osteoarthritis 08/21/2013   Osteoporosis 08/21/2013    Past Surgical History:  Procedure Laterality Date   BACK SURGERY     lumbar laminectomy   BIOPSY THYROID     BREAST CYST ASPIRATION Left    BREAST SURGERY     BUNIONECTOMY Right    COLONOSCOPY WITH PROPOFOL N/A 11/27/2017   Procedure: COLONOSCOPY WITH PROPOFOL;  Surgeon: Virgel Manifold, MD;  Location: [facility] ENDOSCOPY;  Service: Endoscopy;  Laterality: N/A;   EYE SURGERY     SPINE SURGERY      Family History  Problem Relation Age of Onset   Asthma Mother    Hypertension Mother    Diabetes Mother    Cancer Mother        Leukemia   Peripheral vascular disease Mother    Osteoporosis Mother    Glaucoma Mother    Arthritis Mother    Varicose Veins Mother    Heart disease Father        coronary artery occlusion   Early death Father    Heart attack Father    Hypertension Sister    Diabetes Sister    Glaucoma Sister    Asthma Sister    Hearing loss Sister    Kidney disease Sister    Vision loss Sister    Varicose Veins Sister    Diabetes Maternal Grandmother    Heart disease Maternal Grandmother    Colon cancer Maternal Uncle    Lung cancer Paternal Uncle    Early death Paternal Uncle    Diabetes Maternal Grandfather     Social History   Tobacco Use   Smoking status: Never   Smokeless tobacco: Never   Tobacco comments:    smoking cesaation materials not required  Vaping Use   Vaping Use: Never used  Substance Use Topics   Alcohol use:  Yes    Alcohol/week: 0.0 standard drinks    Comment: Wine (very rarely).   Drug use: No     Allergies  Allergen Reactions   Nickel Rash    Almost went blind when wearing nickel eye glasses   Codeine Other (See Comments)    Ears ringing   Lovastatin Rash   Tape Rash    Health Maintenance  Topic Date Due   MAMMOGRAM  06/27/2018  Zoster Vaccines- Shingrix (2 of 2) 08/08/2021   COVID-19 Vaccine (4 - Booster for Pfizer series) 09/30/2021 (Originally 04/20/2020)   INFLUENZA VACCINE  11/14/2021   COLONOSCOPY (Pts 45-75yr Insurance coverage will need to be confirmed)  11/28/2022   Pneumonia Vaccine 76 Years old  Completed   DEXA SCAN  Completed   Hepatitis C Screening  Completed   HPV VACCINES  Aged Out   TETANUS/TDAP  Discontinued    Chart Review Today: I personally reviewed active problem list, medication list, allergies, family history, social history, health maintenance, notes from last encounter, lab results, imaging with the patient/caregiver today.   Review of Systems  Constitutional: Negative.   HENT: Negative.    Eyes: Negative.   Respiratory: Negative.    Cardiovascular: Negative.   Gastrointestinal: Negative.   Endocrine: Negative.   Genitourinary: Negative.   Musculoskeletal: Negative.   Skin: Negative.   Allergic/Immunologic: Negative.   Neurological: Negative.   Hematological: Negative.   Psychiatric/Behavioral: Negative.    All other systems reviewed and are negative.   Objective:   Vitals:   09/14/21 1349  BP: 110/72  Pulse: 99  Resp: 12  Temp: 97.7 F (36.5 C)  TempSrc: Oral  SpO2: 98%  Weight: 108 lb 8 oz (49.2 kg)  Height: '4\' 11"'$  (1.499 m)    Body mass index is 21.91 kg/m.  Physical Exam Vitals reviewed.  Constitutional:      General: She is not in acute distress.    Appearance: Normal appearance. She is normal weight. She is not ill-appearing, toxic-appearing or diaphoretic.  HENT:     Head: Normocephalic and atraumatic.      Right Ear: External ear normal.     Left Ear: External ear normal.     Nose: Nose normal.  Eyes:     General: No scleral icterus.       Right eye: No discharge.        Left eye: No discharge.     Conjunctiva/sclera: Conjunctivae normal.  Cardiovascular:     Rate and Rhythm: Normal rate and regular rhythm.     Pulses: Normal pulses.     Heart sounds: Normal heart sounds. No murmur heard.   No friction rub. No gallop.  Pulmonary:     Effort: Pulmonary effort is normal. No respiratory distress.     Breath sounds: Normal breath sounds. No stridor. No wheezing, rhonchi or rales.  Abdominal:     General: Bowel sounds are normal.     Palpations: Abdomen is soft.  Neurological:     Mental Status: She is alert. Mental status is at baseline.  Psychiatric:        Mood and Affect: Mood normal.        Behavior: Behavior normal.        Assessment & Plan:     ICD-10-CM   1. Hypothyroidism, unspecified type  E03.9 TSH    levothyroxine (SYNTHROID) 75 MCG tablet   No concerning symptoms, due for recheck of labs and refills    2. Mixed hyperlipidemia  EG25.4COMPLETE METABOLIC PANEL WITH GFR    Lipid panel    rosuvastatin (CRESTOR) 5 MG tablet   Very elevated cholesterol not currently on statin concern with prior nickel allergy, recheck labs    3. Essential hypertension  IY70COMPLETE METABOLIC PANEL WITH GFR   Not on meds, blood pressure is nice and low and well-controlled    4. Aortic atherosclerosis ([facility])  IW23.7COMPLETE METABOLIC PANEL WITH GFR  Lipid panel   Not currently on statin, rechecking labs, monitoring    5. Gastroesophageal reflux disease, unspecified whether esophagitis present  K21.9    Not currently on medications, symptoms controlled    6. Encounter for medication monitoring  Z51.81 CBC with Differential/Platelet    COMPLETE METABOLIC PANEL WITH GFR    Lipid panel    TSH    7. Osteopenia of neck of femur, unspecified laterality  M85.859    Due for follow-up  bone density which was previously ordered    8. Postmenopausal estrogen deficiency  Z78.0    Due for DEXA    9. History of rosacea  Z87.2    Patient states she has enough current medications did not need refills today    10. Facial rash  R21    Consistent with rosacea on multiple medications    11. Nickel allergy  Z91.09    Severe allergy       F/up in 3-4 months after restarting statin  Delsa Grana, PA-C 09/14/21 2:04 PM

## 2021-09-14 NOTE — Progress Notes (Signed)
Subjective:   Angela Kelly is a 76 y.o. female who presents for Medicare Annual (Subsequent) preventive examination.  Virtual Visit via Telephone Note  I connected with  Angela Kelly on 09/14/21 at 10:30 AM EDT by telephone and verified that I am speaking with the correct person using two identifiers.  Location: Patient: home Provider: [place] Persons participating in the virtual visit: Angela Kelly   I discussed the limitations, risks, security and privacy concerns of performing an evaluation and management service by telephone and the availability of in person appointments. The patient expressed understanding and agreed to proceed.  Interactive audio and video telecommunications were attempted between this nurse and patient, however failed, due to patient having technical difficulties OR patient did not have access to video capability.  We continued and completed visit with audio only.  Some vital signs may be absent or patient reported.   Angela Marker, LPN   Review of Systems     Cardiac Risk Factors include: advanced age (>75mn, >>62women);dyslipidemia     Objective:    There were no vitals filed for this visit. There is no height or weight on file to calculate BMI.     09/14/2021   10:50 AM 09/08/2020   10:14 AM 06/03/2020    2:12 PM 09/08/2019    9:40 AM 06/05/2018    1:56 PM 11/27/2017   10:53 AM 07/23/2017   10:56 AM  Advanced Directives  Does Patient Have a Medical Advance Directive? No No No No No No Yes  Type of ATransport plannerLiving will  Copy of HB[place]in Chart?       No - copy requested  Would patient like information on creating a medical advance directive? Yes (MAU/Ambulatory/Procedural Areas - Information given) Yes (MAU/Ambulatory/Procedural Areas - Information given)  Yes (MAU/Ambulatory/Procedural Areas - Information given)       Current Medications (verified) Outpatient Encounter  Medications as of 09/14/2021  Medication Sig   acetaminophen (TYLENOL) 500 MG tablet Take 500 mg by mouth every 6 (six) hours as needed.   aspirin EC 81 MG tablet Take 1 tablet (81 mg total) by mouth daily. (Patient taking differently: Take 81 mg by mouth at bedtime.)   calcium carbonate (TUMS - DOSED IN MG ELEMENTAL CALCIUM) 500 MG chewable tablet Chew 1-3 tablets by mouth daily as needed for indigestion or heartburn.   ciclesonide (OMNARIS) 50 MCG/ACT nasal spray Place into the nose.   cromolyn (OPTICROM) 4 % ophthalmic solution PLACE 1 DROP INTO BOTH EYES 4 (FOUR) TIMES DAILY AS NEEDED.   desonide (DESOWEN) 0.05 % cream APPLY TOPICALLY TWICE A DAY   diphenhydrAMINE (BENADRYL) 25 mg capsule Take 25 mg by mouth See admin instructions. Take 25 mg every night and may take an additional 25 mg dose during the day as needed for allergies   hydrocortisone cream 1 % Apply 1 application topically 2 (two) times daily.   levothyroxine (SYNTHROID) 75 MCG tablet Take 1 tablet (75 mcg total) by mouth daily before breakfast.   mometasone (NASONEX) 50 MCG/ACT nasal spray PLACE 2 SPRAYS INTO THE NOSE DAILY.   Multiple Vitamin (MULTIVITAMIN) capsule Take by mouth.   PAZEO 0.7 % SOLN Place 1 drop into both eyes at bedtime.    Probiotic Product (PROBIOTIC DAILY PO) Take 1 capsule by mouth daily as needed (when taking antibiotics).    doxycycline (VIBRAMYCIN) 50 MG capsule Take 1 capsule (50 mg  total) by mouth daily. (Patient not taking: Reported on 09/14/2021)   rosuvastatin (CRESTOR) 5 MG tablet Take 1 tablet (5 mg total) by mouth daily. (Patient not taking: Reported on 09/14/2021)   [DISCONTINUED] metroNIDAZOLE (METROGEL) 1 % gel APPLY 1 APPLICATION TOPICALLY DAILY   No facility-administered encounter medications on file as of 09/14/2021.    Allergies (verified) Nickel, Codeine, Lovastatin, and Tape   History: Past Medical History:  Diagnosis Date   Allergic rhinitis    Allergy    Anemia    Arthritis     Bilateral ovarian cysts    laterality not known   Bunion    Cataract of both eyes    Complication of anesthesia    Bridge x 2 perm., crowns front    Deviated septum    Difficult intubation    Elevated BP    Environmental and seasonal allergies    Essential hypertension    Fibrocystic breast    GERD (gastroesophageal reflux disease)    Hammer toe    History of closed fracture of nasal bones    History of pneumonia    in her 86's   Hyperlipidemia    IFG (impaired fasting glucose)    Ocular rosacea    see's [place]   OSA (obstructive sleep apnea)    Osteoarthritis cervical spine    Osteoarthritis of both hands    Osteoporosis    Osteoporosis    Rosacea    telengiectatic   Shingles    left eye   Sleep apnea    Thyroid disease    Thyroid nodule    left, sees Endocrinology   Vitamin D deficiency disease    Past Surgical History:  Procedure Laterality Date   BACK SURGERY     lumbar laminectomy   BIOPSY THYROID     BREAST CYST ASPIRATION Left    BREAST SURGERY     BUNIONECTOMY Right    COLONOSCOPY WITH PROPOFOL N/A 11/27/2017   Procedure: COLONOSCOPY WITH PROPOFOL;  Surgeon: Virgel Manifold, MD;  Location: [facility] ENDOSCOPY;  Service: Endoscopy;  Laterality: N/A;   EYE SURGERY     SPINE SURGERY     Family History  Problem Relation Age of Onset   Asthma Mother    Hypertension Mother    Diabetes Mother    Cancer Mother        Leukemia   Peripheral vascular disease Mother    Osteoporosis Mother    Glaucoma Mother    Arthritis Mother    Varicose Veins Mother    Heart disease Father        coronary artery occlusion   Early death Father    Heart attack Father    Hypertension Sister    Diabetes Sister    Glaucoma Sister    Asthma Sister    Hearing loss Sister    Kidney disease Sister    Vision loss Sister    Varicose Veins Sister    Diabetes Maternal Grandmother    Heart disease Maternal Grandmother    Colon cancer Maternal Uncle    Lung  cancer Paternal Uncle    Early death Paternal Uncle    Diabetes Maternal Grandfather    Social History   Socioeconomic History   Marital status: Divorced    Spouse name: Not on file   Number of children: 3   Years of education: some college   Highest education level: 12th grade  Occupational History   Occupation: Retired  Tobacco Use  Smoking status: Never   Smokeless tobacco: Never   Tobacco comments:    smoking cesaation materials not required  Vaping Use   Vaping Use: Never used  Substance and Sexual Activity   Alcohol use: Yes    Alcohol/week: 0.0 standard drinks    Comment: Wine (very rarely).   Drug use: No   Sexual activity: Not Currently  Other Topics Concern   Not on file  Social History Narrative   Pt's son lives with her.    Social Determinants of Health   Financial Resource Strain: Low Risk    Difficulty of Paying Living Expenses: Not hard at all  Food Insecurity: No Food Insecurity   Worried About Charity fundraiser in the Last Year: Never true   [place] in the Last Year: Never true  Transportation Needs: No Transportation Needs   Lack of Transportation (Medical): No   Lack of Transportation (Non-Medical): No  Physical Activity: Insufficiently Active   Days of Exercise per Week: 3 days   Minutes of Exercise per Session: 20 min  Stress: Stress Concern Present   Feeling of Stress : To some extent  Social Connections: Socially Isolated   Frequency of Communication with Friends and Family: More than three times a week   Frequency of Social Gatherings with Friends and Family: Twice a week   Attends Religious Services: Never   Marine scientist or Organizations: No   Attends Music therapist: Never   Marital Status: Divorced    Tobacco Counseling Counseling given: Not Answered Tobacco comments: smoking cesaation materials not required   Clinical Intake:  Pre-visit preparation completed: Yes  Pain : No/denies pain      Nutritional Risks: None Diabetes: No  How often do you need to have someone help you when you read instructions, pamphlets, or other written materials from your doctor or pharmacy?: 1 - Never    Interpreter Needed?: No  Information entered by :: Angela Marker LPN   Activities of Daily Living    09/14/2021   10:52 AM 06/13/2021    3:43 PM  In your present state of health, do you have any difficulty performing the following activities:  Hearing? 0 0  Vision? 0 1  Difficulty concentrating or making decisions? 0 0  Walking or climbing stairs? 0 0  Dressing or bathing? 0 0  Doing errands, shopping? 0 0  Preparing Food and eating ? N   Using the Toilet? N   In the past six months, have you accidently leaked urine? N   Do you have problems with loss of bowel control? N   Managing your Medications? N   Managing your Finances? N   Housekeeping or managing your Housekeeping? N     Patient Care Team: Delsa Grana, PA-C as PCP - General (Family Medicine)  Indicate any recent Medical Services you may have received from other than [facility] providers in the past year (date may be approximate).     Assessment:   This is a routine wellness examination for Angela Kelly.  Hearing/Vision screen Hearing Screening - Comments:: Pt denies hearing difficulty Vision Screening - Comments:: Annual vision screenings at [facility] Dr. Thomasene Ripple  Dietary issues and exercise activities discussed: Current Exercise Habits: Home exercise routine, Type of exercise: Other - see comments (exercise bike), Time (Minutes): 20, Frequency (Times/Week): 3, Weekly Exercise (Minutes/Week): 60, Intensity: Moderate, Exercise limited by: None identified   Goals Addressed   None    Depression Screen  09/14/2021   10:48 AM 06/13/2021    3:42 PM 02/28/2021   10:59 AM 02/20/2021    1:19 PM 01/23/2021    3:46 PM 09/08/2020   10:12 AM 07/04/2020    2:43 PM  PHQ 2/9 Scores  PHQ - 2 Score 1 0 0 0 0 3 6  PHQ- 9  Score 1 0 3 0 0 3     Fall Risk    09/14/2021   10:51 AM 06/13/2021    3:42 PM 02/28/2021   10:59 AM 02/20/2021    1:10 PM 01/23/2021    3:46 PM  Fall Risk   Falls in the past year? 0 0 0 0 0  Number falls in past yr: 0 0 0 0 0  Injury with Fall? 0 0 0 0 0  Risk for fall due to : No Fall Risks  No Fall Risks    Follow up Falls prevention discussed  Falls prevention discussed      FALL RISK PREVENTION PERTAINING TO THE HOME:  Any stairs in or around the home? Yes  If so, are there any without handrails? Yes - 2 steps outside Home free of loose throw rugs in walkways, pet beds, electrical cords, etc? Yes  Adequate lighting in your home to reduce risk of falls? Yes   ASSISTIVE DEVICES UTILIZED TO PREVENT FALLS:  Life alert? No  Use of a cane, walker or w/c? No  Grab bars in the bathroom? No  Shower chair or bench in shower? No  Elevated toilet seat or a handicapped toilet? No   TIMED UP AND GO:  Was the test performed? No . Telephonic visit.   Cognitive Function: Normal cognitive status assessed by direct observation by this Nurse Health Advisor. No abnormalities found.          09/08/2019    9:46 AM 07/23/2017   10:53 AM  6CIT Screen  What Year? 0 points 0 points  What month? 0 points 0 points  What time? 0 points 0 points  Count back from 20 0 points 0 points  Months in reverse 0 points 0 points  Repeat phrase 0 points 2 points  Total Score 0 points 2 points    Immunizations Immunization History  Administered Date(s) Administered   Fluad Quad(high Dose 65+) 12/26/2018, 06/13/2021   Influenza, High Dose Seasonal PF 02/21/2017, 01/10/2018   Influenza-Unspecified 01/20/2013   PFIZER(Purple Top)SARS-COV-2 Vaccination 05/13/2019, 06/03/2019, 02/24/2020   Pneumococcal Conjugate-13 11/02/2013   Pneumococcal Polysaccharide-23 03/30/2011, 11/02/2013   Tdap 01/18/2011   Zoster Recombinat (Shingrix) 06/13/2021   Zoster, Live 06/14/2013    TDAP status: Due, Education  has been provided regarding the importance of this vaccine. Advised may receive this vaccine at local pharmacy or Health Dept. Aware to provide a copy of the vaccination record if obtained from local pharmacy or Health Dept. Verbalized acceptance and understanding.  Flu Vaccine status: Up to date  Pneumococcal vaccine status: Up to date  Covid-19 vaccine status: Completed vaccines  Qualifies for Shingles Vaccine? Yes   Zostavax completed Yes   Shingrix Completed?: Yes  Screening Tests Health Maintenance  Topic Date Due   MAMMOGRAM  06/27/2018   COVID-19 Vaccine (4 - Booster for Pfizer series) 04/20/2020   Zoster Vaccines- Shingrix (2 of 2) 08/08/2021   INFLUENZA VACCINE  11/14/2021   COLONOSCOPY (Pts 45-56yr Insurance coverage will need to be confirmed)  11/28/2022   Pneumonia Vaccine 76 Years old  Completed   DEXA SCAN  Completed   Hepatitis  C Screening  Completed   HPV VACCINES  Aged Out   TETANUS/TDAP  Discontinued    Health Maintenance  Health Maintenance Due  Topic Date Due   MAMMOGRAM  06/27/2018   COVID-19 Vaccine (4 - Booster for Pfizer series) 04/20/2020   Zoster Vaccines- Shingrix (2 of 2) 08/08/2021    Colorectal cancer screening: Type of screening: Colonoscopy. Completed 11/27/17. Repeat every 5 years  Mammogram status: Completed 06/26/17. Repeat every year. Ordered 06/13/21  Bone Density status: Completed 01/30/17. Results reflect: Bone density results: OSTEOPENIA. Repeat every 2 years.  Lung Cancer Screening: (Low Dose CT Chest recommended if Age 35-80 years, 30 pack-year currently smoking OR have quit w/in 15years.) does not qualify.   Additional Screening:  Hepatitis C Screening: does qualify; Completed 11/07/15  Vision Screening: Recommended annual ophthalmology exams for early detection of glaucoma and other disorders of the eye. Is the patient up to date with their annual eye exam?  No  Who is the provider or what is the name of the office in which  the patient attends annual eye exams? [facility].   Dental Screening: Recommended annual dental exams for proper oral hygiene  Community Resource Referral / Chronic Care Management: CRR required this visit?  No   CCM required this visit?  No      Plan:     I have personally reviewed and noted the following in the patient's chart:   Medical and social history Use of alcohol, tobacco or illicit drugs  Current medications and supplements including opioid prescriptions.  Functional ability and status Nutritional status Physical activity Advanced directives List of other physicians Hospitalizations, surgeries, and ER visits in previous 12 months Vitals Screenings to include cognitive, depression, and falls Referrals and appointments  In addition, I have reviewed and discussed with patient certain preventive protocols, quality metrics, and best practice recommendations. A written personalized care plan for preventive services as well as general preventive health recommendations were provided to patient.     Angela Marker, LPN   10/22/4694   Nurse Notes: pt states she has been feeling depressed about state of current affairs but does not feel like it is clinical depression and not related to anything in her personal life. Pt advised to discuss with Leisa at appt this afternoon with possible therapy options for healthy coping mechanisms.

## 2021-09-14 NOTE — Patient Instructions (Signed)
Angela Kelly , Thank you for taking time to come for your Medicare Wellness Visit. I appreciate your ongoing commitment to your health goals. Please review the following plan we discussed and let me know if I can assist you in the future.   Screening recommendations/referrals: Colonoscopy: done 11/27/17; repeat as directed Mammogram: done 06/26/17. Please call [phone] to schedule your mammogram and bone density screening.  Bone Density: done 01/30/17.  Recommended yearly ophthalmology/optometry visit for glaucoma screening and checkup Recommended yearly dental visit for hygiene and checkup  Vaccinations: Influenza vaccine: done 06/13/21 Pneumococcal vaccine: done 11/02/13 Tdap vaccine: due Shingles vaccine: done 06/13/21; due for second dose   Covid-19:done 05/13/19, 06/03/19, 02/24/20  Advanced directives: Advance directive discussed with you today. I have provided a copy for you to complete at home and have notarized. Once this is complete please bring a copy in to our office so we can scan it into your chart.   Conditions/risks identified: Keep up the great work!  Next appointment: Follow up in one year for your annual wellness visit    Preventive Care 65 Years and Older, Female Preventive care refers to lifestyle choices and visits with your health care provider that can promote health and wellness. What does preventive care include? A yearly physical exam. This is also called an annual well check. Dental exams once or twice a year. Routine eye exams. Ask your health care provider how often you should have your eyes checked. Personal lifestyle choices, including: Daily care of your teeth and gums. Regular physical activity. Eating a healthy diet. Avoiding tobacco and drug use. Limiting alcohol use. Practicing safe sex. Taking low-dose aspirin every day. Taking vitamin and mineral supplements as recommended by your health care provider. What happens during an annual well  check? The services and screenings done by your health care provider during your annual well check will depend on your age, overall health, lifestyle risk factors, and family history of disease. Counseling  Your health care provider may ask you questions about your: Alcohol use. Tobacco use. Drug use. Emotional well-being. Home and relationship well-being. Sexual activity. Eating habits. History of falls. Memory and ability to understand (cognition). Work and work Statistician. Reproductive health. Screening  You may have the following tests or measurements: Height, weight, and BMI. Blood pressure. Lipid and cholesterol levels. These may be checked every 5 years, or more frequently if you are over 31 years old. Skin check. Lung cancer screening. You may have this screening every year starting at age 23 if you have a 30-pack-year history of smoking and currently smoke or have quit within the past 15 years. Fecal occult blood test (FOBT) of the stool. You may have this test every year starting at age 46. Flexible sigmoidoscopy or colonoscopy. You may have a sigmoidoscopy every 5 years or a colonoscopy every 10 years starting at age 59. Hepatitis C blood test. Hepatitis B blood test. Sexually transmitted disease (STD) testing. Diabetes screening. This is done by checking your blood sugar (glucose) after you have not eaten for a while (fasting). You may have this done every 1-3 years. Bone density scan. This is done to screen for osteoporosis. You may have this done starting at age 53. Mammogram. This may be done every 1-2 years. Talk to your health care provider about how often you should have regular mammograms. Talk with your health care provider about your test results, treatment options, and if necessary, the need for more tests. Vaccines  Your health care provider may  recommend certain vaccines, such as: Influenza vaccine. This is recommended every year. Tetanus, diphtheria, and  acellular pertussis (Tdap, Td) vaccine. You may need a Td booster every 10 years. Zoster vaccine. You may need this after age 54. Pneumococcal 13-valent conjugate (PCV13) vaccine. One dose is recommended after age 65. Pneumococcal polysaccharide (PPSV23) vaccine. One dose is recommended after age 7. Talk to your health care provider about which screenings and vaccines you need and how often you need them. This information is not intended to replace advice given to you by your health care provider. Make sure you discuss any questions you have with your health care provider. Document Released: 04/29/2015 Document Revised: 12/21/2015 Document Reviewed: 02/01/2015 Elsevier Interactive Patient Education  2017 [place] Prevention in the Home Falls can cause injuries. They can happen to people of all ages. There are many things you can do to make your home safe and to help prevent falls. What can I do on the outside of my home? Regularly fix the edges of walkways and driveways and fix any cracks. Remove anything that might make you trip as you walk through a door, such as a raised step or threshold. Trim any bushes or trees on the path to your home. Use bright outdoor lighting. Clear any walking paths of anything that might make someone trip, such as rocks or tools. Regularly check to see if handrails are loose or broken. Make sure that both sides of any steps have handrails. Any raised decks and porches should have guardrails on the edges. Have any leaves, snow, or ice cleared regularly. Use sand or salt on walking paths during winter. Clean up any spills in your garage right away. This includes oil or grease spills. What can I do in the bathroom? Use night lights. Install grab bars by the toilet and in the tub and shower. Do not use towel bars as grab bars. Use non-skid mats or decals in the tub or shower. If you need to sit down in the shower, use a plastic, non-slip stool. Keep  the floor dry. Clean up any water that spills on the floor as soon as it happens. Remove soap buildup in the tub or shower regularly. Attach bath mats securely with double-sided non-slip rug tape. Do not have throw rugs and other things on the floor that can make you trip. What can I do in the bedroom? Use night lights. Make sure that you have a light by your bed that is easy to reach. Do not use any sheets or blankets that are too big for your bed. They should not hang down onto the floor. Have a firm chair that has side arms. You can use this for support while you get dressed. Do not have throw rugs and other things on the floor that can make you trip. What can I do in the kitchen? Clean up any spills right away. Avoid walking on wet floors. Keep items that you use a lot in easy-to-reach places. If you need to reach something above you, use a strong step stool that has a grab bar. Keep electrical cords out of the way. Do not use floor polish or wax that makes floors slippery. If you must use wax, use non-skid floor wax. Do not have throw rugs and other things on the floor that can make you trip. What can I do with my stairs? Do not leave any items on the stairs. Make sure that there are handrails on both sides of  the stairs and use them. Fix handrails that are broken or loose. Make sure that handrails are as long as the stairways. Check any carpeting to make sure that it is firmly attached to the stairs. Fix any carpet that is loose or worn. Avoid having throw rugs at the top or bottom of the stairs. If you do have throw rugs, attach them to the floor with carpet tape. Make sure that you have a light switch at the top of the stairs and the bottom of the stairs. If you do not have them, ask someone to add them for you. What else can I do to help prevent falls? Wear shoes that: Do not have high heels. Have rubber bottoms. Are comfortable and fit you well. Are closed at the toe. Do not  wear sandals. If you use a stepladder: Make sure that it is fully opened. Do not climb a closed stepladder. Make sure that both sides of the stepladder are locked into place. Ask someone to hold it for you, if possible. Clearly mark and make sure that you can see: Any grab bars or handrails. First and last steps. Where the edge of each step is. Use tools that help you move around (mobility aids) if they are needed. These include: Canes. Walkers. Scooters. Crutches. Turn on the lights when you go into a dark area. Replace any light bulbs as soon as they burn out. Set up your furniture so you have a clear path. Avoid moving your furniture around. If any of your floors are uneven, fix them. If there are any pets around you, be aware of where they are. Review your medicines with your doctor. Some medicines can make you feel dizzy. This can increase your chance of falling. Ask your doctor what other things that you can do to help prevent falls. This information is not intended to replace advice given to you by your health care provider. Make sure you discuss any questions you have with your health care provider. Document Released: 01/27/2009 Document Revised: 09/08/2015 Document Reviewed: 05/07/2014 Elsevier Interactive Patient Education  2017 [employer].

## 2021-09-14 NOTE — Patient Instructions (Signed)
[facility] at [facility] [place],  [place]  [id number] Get Driving Directions Main: [phone]  Call to schedule your bone density and mammogram

## 2021-09-15 LAB — LIPID PANEL
Cholesterol: 253 mg/dL — ABNORMAL HIGH (ref <200)
HDL: 66 mg/dL (ref >=50)
LDL Cholesterol (Calc): 164 mg/dL (calc) — ABNORMAL HIGH
Non-HDL Cholesterol (Calc): 187 mg/dL (calc) — ABNORMAL HIGH (ref <130)
Total CHOL/HDL Ratio: 3.8 (calc) (ref <5.0)
Triglycerides: 111 mg/dL (ref <150)

## 2021-09-15 LAB — COMPLETE METABOLIC PANEL WITH GFR
AG Ratio: 1.6 (calc) (ref 1.0–2.5)
ALT: 14 U/L (ref 6–29)
AST: 21 U/L (ref 10–35)
Albumin: 4.1 g/dL (ref 3.6–5.1)
Alkaline phosphatase (APISO): 83 U/L (ref 37–153)
BUN: 12 mg/dL (ref 7–25)
CO2: 28 mmol/L (ref 20–32)
Calcium: 9.7 mg/dL (ref 8.6–10.4)
Chloride: 103 mmol/L (ref 98–110)
Creat: 0.84 mg/dL (ref 0.60–1.00)
Globulin: 2.6 g/dL (calc) (ref 1.9–3.7)
Glucose, Bld: 92 mg/dL (ref 65–99)
Potassium: 5.4 mmol/L — ABNORMAL HIGH (ref 3.5–5.3)
Sodium: 142 mmol/L (ref 135–146)
Total Bilirubin: 0.4 mg/dL (ref 0.2–1.2)
Total Protein: 6.7 g/dL (ref 6.1–8.1)
eGFR: 71 mL/min/{1.73_m2} (ref >=60)

## 2021-09-15 LAB — CBC WITH DIFFERENTIAL/PLATELET
Absolute Monocytes: 493 cells/uL (ref 200–950)
Basophils Absolute: 44 cells/uL (ref 0–200)
Basophils Relative: 0.5 %
Eosinophils Absolute: 26 cells/uL (ref 15–500)
Eosinophils Relative: 0.3 %
HCT: 45.6 % — ABNORMAL HIGH (ref 35.0–45.0)
Hemoglobin: 15 g/dL (ref 11.7–15.5)
Lymphs Abs: 1804 cells/uL (ref 850–3900)
MCH: 28.4 pg (ref 27.0–33.0)
MCHC: 32.9 g/dL (ref 32.0–36.0)
MCV: 86.4 fL (ref 80.0–100.0)
MPV: 10.1 fL (ref 7.5–12.5)
Monocytes Relative: 5.6 %
Neutro Abs: 6433 cells/uL (ref 1500–7800)
Neutrophils Relative %: 73.1 %
Platelets: 441 10*3/uL — ABNORMAL HIGH (ref 140–400)
RBC: 5.28 10*6/uL — ABNORMAL HIGH (ref 3.80–5.10)
RDW: 13.1 % (ref 11.0–15.0)
Total Lymphocyte: 20.5 %
WBC: 8.8 10*3/uL (ref 3.8–10.8)

## 2021-09-15 LAB — TSH: TSH: 1.67 mIU/L (ref 0.40–4.50)

## 2021-11-30 ENCOUNTER — Other Ambulatory Visit: Payer: Self-pay | Admitting: Internal Medicine

## 2021-11-30 DIAGNOSIS — R21 Rash and other nonspecific skin eruption: Secondary | ICD-10-CM

## 2021-12-01 NOTE — Telephone Encounter (Signed)
Requested medication (s) are due for refill today:yes  Requested medication (s) are on the active medication list: yes  Last refill:  07/20/21  Future visit scheduled: no  Notes to clinic:  Unable to refill per protocol, cannot delegate.      Requested Prescriptions  Pending Prescriptions Disp Refills   desonide (DESOWEN) 0.05 % cream [Pharmacy Med Name: DESONIDE 0.05% CREAM] 30 g 0    Sig: APPLY TO AFFECTED AREA TWICE A DAY     Not Delegated - Dermatology:  Corticosteroids Failed - 11/30/2021 11:36 AM      Failed - This refill cannot be delegated      Passed - Valid encounter within last 12 months    Recent Outpatient Visits           2 months ago Hypothyroidism, unspecified type   [facility] Delsa Grana, PA-C   5 months ago Need for influenza vaccination   [facility] Teodora Medici, DO   9 months ago Facial rash   [facility] Steele Sizer, MD   9 months ago [facility] Rory Percy M, DO   10 months ago Rash and nonspecific skin eruption   [facility] [place], Kristeen Miss, [place]

## 2022-06-14 ENCOUNTER — Other Ambulatory Visit: Payer: Self-pay | Admitting: Internal Medicine

## 2022-06-14 DIAGNOSIS — R21 Rash and other nonspecific skin eruption: Secondary | ICD-10-CM

## 2022-06-14 NOTE — Telephone Encounter (Signed)
Requested medication (s) are due for refill today- expired Rx  Requested medication (s) are on the active medication list -no  Future visit scheduled -no  Last refill: 06/13/21  Notes to clinic: off protocol medication- provider review   Requested Prescriptions  Pending Prescriptions Disp Refills   doxycycline (VIBRAMYCIN) 50 MG capsule [Pharmacy Med Name: DOXYCYCLINE HYCLATE 50 MG CAP] 90 capsule 3    Sig: TAKE 1 CAPSULE BY MOUTH EVERY DAY     Off-Protocol Failed - 06/14/2022  1:47 AM      Failed - Medication not assigned to a protocol, review manually.      Passed - Valid encounter within last 12 months    Recent Outpatient Visits           9 months ago Hypothyroidism, unspecified type   [facility] Delsa Grana, PA-C   1 year ago Need for influenza vaccination   [facility] Teodora Medici, DO   1 year ago Facial rash   [facility] Steele Sizer, MD   1 year ago [facility] Rory Percy M, DO   1 year ago Rash and nonspecific skin eruption   [facility] Delsa Grana, [place]                 Requested Prescriptions  Pending Prescriptions Disp Refills   doxycycline (VIBRAMYCIN) 50 MG capsule [Pharmacy Med Name: DOXYCYCLINE HYCLATE 50 MG CAP] 90 capsule 3    Sig: TAKE 1 CAPSULE BY MOUTH EVERY DAY     Off-Protocol Failed - 06/14/2022  1:47 AM      Failed - Medication not assigned to a protocol, review manually.      Passed - Valid encounter within last 12 months    Recent Outpatient Visits           9 months ago Hypothyroidism, unspecified type   [facility] Delsa Grana, PA-C   1 year ago Need for influenza vaccination   [facility] Teodora Medici, DO   1 year ago Facial rash   [facility] Steele Sizer, MD   1 year ago [facility] Rory Percy M, DO   1 year ago Rash and nonspecific skin eruption   [facility] Delsa Grana, [place]

## 2022-09-21 ENCOUNTER — Encounter: Payer: Self-pay | Admitting: Family Medicine

## 2022-09-21 ENCOUNTER — Ambulatory Visit (INDEPENDENT_AMBULATORY_CARE_PROVIDER_SITE_OTHER): Payer: Medicare Other | Admitting: Family Medicine

## 2022-09-21 ENCOUNTER — Ambulatory Visit (INDEPENDENT_AMBULATORY_CARE_PROVIDER_SITE_OTHER): Payer: Medicare Other

## 2022-09-21 VITALS — BP 128/76 | HR 91 | Resp 16 | Ht 59.0 in | Wt 104.0 lb

## 2022-09-21 VITALS — BP 126/78 | Ht 59.0 in | Wt 104.0 lb

## 2022-09-21 DIAGNOSIS — E039 Hypothyroidism, unspecified: Secondary | ICD-10-CM | POA: Diagnosis not present

## 2022-09-21 DIAGNOSIS — M791 Myalgia, unspecified site: Secondary | ICD-10-CM | POA: Diagnosis not present

## 2022-09-21 DIAGNOSIS — M1990 Unspecified osteoarthritis, unspecified site: Secondary | ICD-10-CM

## 2022-09-21 DIAGNOSIS — I700 Atherosclerosis of aorta: Secondary | ICD-10-CM

## 2022-09-21 DIAGNOSIS — K219 Gastro-esophageal reflux disease without esophagitis: Secondary | ICD-10-CM

## 2022-09-21 DIAGNOSIS — G4733 Obstructive sleep apnea (adult) (pediatric): Secondary | ICD-10-CM | POA: Diagnosis not present

## 2022-09-21 DIAGNOSIS — M542 Cervicalgia: Secondary | ICD-10-CM

## 2022-09-21 DIAGNOSIS — I10 Essential (primary) hypertension: Secondary | ICD-10-CM | POA: Diagnosis not present

## 2022-09-21 DIAGNOSIS — E782 Mixed hyperlipidemia: Secondary | ICD-10-CM | POA: Diagnosis not present

## 2022-09-21 DIAGNOSIS — M4134 Thoracogenic scoliosis, thoracic region: Secondary | ICD-10-CM | POA: Diagnosis not present

## 2022-09-21 DIAGNOSIS — Z0000 Encounter for general adult medical examination without abnormal findings: Secondary | ICD-10-CM | POA: Diagnosis not present

## 2022-09-21 DIAGNOSIS — D582 Other hemoglobinopathies: Secondary | ICD-10-CM

## 2022-09-21 DIAGNOSIS — Z5181 Encounter for therapeutic drug level monitoring: Secondary | ICD-10-CM

## 2022-09-21 DIAGNOSIS — J309 Allergic rhinitis, unspecified: Secondary | ICD-10-CM

## 2022-09-21 DIAGNOSIS — Z872 Personal history of diseases of the skin and subcutaneous tissue: Secondary | ICD-10-CM

## 2022-09-21 DIAGNOSIS — D75839 Thrombocytosis, unspecified: Secondary | ICD-10-CM | POA: Diagnosis not present

## 2022-09-21 DIAGNOSIS — R413 Other amnesia: Secondary | ICD-10-CM

## 2022-09-21 DIAGNOSIS — L719 Rosacea, unspecified: Secondary | ICD-10-CM

## 2022-09-21 DIAGNOSIS — R7301 Impaired fasting glucose: Secondary | ICD-10-CM

## 2022-09-21 NOTE — Progress Notes (Addendum)
Subjective:   Angela Kelly is a 77 y.o. female who presents for Medicare Annual (Subsequent) preventive examination.  Review of Systems          Objective:    There were no vitals filed for this visit. There is no height or weight on file to calculate BMI.     09/14/2021   10:50 AM 09/08/2020   10:14 AM 06/03/2020    2:12 PM 09/08/2019    9:40 AM 06/05/2018    1:56 PM 11/27/2017   10:53 AM 07/23/2017   10:56 AM  Advanced Directives  Does Patient Have a Medical Advance Directive? No No No No No No Yes  Type of Tax inspector;Living will  Copy of Healthcare Power of Attorney in Chart?       No - copy requested  Would patient like information on creating a medical advance directive? Yes (MAU/Ambulatory/Procedural Areas - Information given) Yes (MAU/Ambulatory/Procedural Areas - Information given)  Yes (MAU/Ambulatory/Procedural Areas - Information given)       Current Medications (verified) Outpatient Encounter Medications as of 09/21/2022  Medication Sig   acetaminophen (TYLENOL) 500 MG tablet Take 500 mg by mouth every 6 (six) hours as needed.   aspirin EC 81 MG tablet Take 1 tablet (81 mg total) by mouth daily.   calcium carbonate (TUMS - DOSED IN MG ELEMENTAL CALCIUM) 500 MG chewable tablet Chew 1-3 tablets by mouth daily as needed for indigestion or heartburn.   ciclesonide (OMNARIS) 50 MCG/ACT nasal spray Place into the nose.   cromolyn (OPTICROM) 4 % ophthalmic solution PLACE 1 DROP INTO BOTH EYES 4 (FOUR) TIMES DAILY AS NEEDED. (Patient not taking: Reported on 09/21/2022)   desonide (DESOWEN) 0.05 % cream APPLY TO AFFECTED AREA TWICE A DAY   diphenhydrAMINE (BENADRYL) 25 mg capsule Take 25 mg by mouth See admin instructions. Take 25 mg every night and may take an additional 25 mg dose during the day as needed for allergies   doxycycline (MONODOX) 50 MG capsule Take 50 mg by mouth daily.   hydrocortisone cream 1 % Apply 1 application topically  2 (two) times daily.   levothyroxine (SYNTHROID) 75 MCG tablet Take 1 tablet (75 mcg total) by mouth daily before breakfast.   mometasone (NASONEX) 50 MCG/ACT nasal spray PLACE 2 SPRAYS INTO THE NOSE DAILY.   Multiple Vitamin (MULTIVITAMIN) capsule Take by mouth.   PAZEO 0.7 % SOLN Place 1 drop into both eyes at bedtime.  (Patient not taking: Reported on 09/21/2022)   Probiotic Product (PROBIOTIC DAILY PO) Take 1 capsule by mouth daily as needed (when taking antibiotics).    rosuvastatin (CRESTOR) 5 MG tablet Take 1 tablet (5 mg total) by mouth daily.   No facility-administered encounter medications on file as of 09/21/2022.    Allergies (verified) Nickel, Codeine, Lovastatin, and Tape   History: Past Medical History:  Diagnosis Date   Allergic rhinitis    Allergy    Anemia    Arthritis    Bilateral ovarian cysts    laterality not known   Bunion    Cataract of both eyes    Complication of anesthesia    Bridge x 2 perm., crowns front    Deviated septum    Difficult intubation    Elevated BP    Environmental and seasonal allergies    Essential hypertension    Fibrocystic breast    GERD (gastroesophageal reflux disease)    Hammer toe  History of closed fracture of nasal bones    History of pneumonia    in her 40's   Hyperlipidemia    IFG (impaired fasting glucose)    Ocular rosacea    see's [facility]   OSA (obstructive sleep apnea)    Osteoarthritis cervical spine    Osteoarthritis of both hands    Osteoporosis    Osteoporosis    Rosacea    telengiectatic   Shingles    left eye   Sleep apnea    Thyroid disease    Thyroid nodule    left, sees Endocrinology   Vitamin D deficiency disease    Past Surgical History:  Procedure Laterality Date   BACK SURGERY     lumbar laminectomy   BIOPSY THYROID     BREAST CYST ASPIRATION Left    BREAST SURGERY     BUNIONECTOMY Right    COLONOSCOPY WITH PROPOFOL N/A 11/27/2017   Procedure: COLONOSCOPY WITH PROPOFOL;   Surgeon: Pasty Spillers, MD;  Location: [facility] ENDOSCOPY;  Service: Endoscopy;  Laterality: N/A;   EYE SURGERY     SPINE SURGERY     Family History  Problem Relation Age of Onset   Asthma Mother    Hypertension Mother    Diabetes Mother    Cancer Mother        Leukemia   Peripheral vascular disease Mother    Osteoporosis Mother    Glaucoma Mother    Arthritis Mother    Varicose Veins Mother    Heart disease Father        coronary artery occlusion   Early death Father    Heart attack Father    Hypertension Sister    Diabetes Sister    Glaucoma Sister    Asthma Sister    Hearing loss Sister    Kidney disease Sister    Vision loss Sister    Varicose Veins Sister    Diabetes Maternal Grandmother    Heart disease Maternal Grandmother    Colon cancer Maternal Uncle    Lung cancer Paternal Uncle    Early death Paternal Uncle    Diabetes Maternal Grandfather    Social History   Socioeconomic History   Marital status: Divorced    Spouse name: Not on file   Number of children: 3   Years of education: some college   Highest education level: 12th grade  Occupational History   Occupation: Retired  Tobacco Use   Smoking status: Never   Smokeless tobacco: Never   Tobacco comments:    smoking Biomedical engineer not required  Vaping Use   Vaping Use: Never used  Substance and Sexual Activity   Alcohol use: Yes    Alcohol/week: 0.0 standard drinks of alcohol    Comment: Wine (very rarely).   Drug use: No   Sexual activity: Not Currently  Other Topics Concern   Not on file  Social History Narrative   Pt's son lives with her.    Social Determinants of Health   Financial Resource Strain: Low Risk  (09/14/2021)   Overall Financial Resource Strain (CARDIA)    Difficulty of Paying Living Expenses: Not hard at all  Food Insecurity: No Food Insecurity (09/14/2021)   Hunger Vital Sign    Worried About Running Out of Food in the Last Year: Never true    Ran Out of Food in  the Last Year: Never true  Transportation Needs: No Transportation Needs (09/14/2021)   PRAPARE - Transportation  Lack of Transportation (Medical): No    Lack of Transportation (Non-Medical): No  Physical Activity: Insufficiently Active (09/14/2021)   Exercise Vital Sign    Days of Exercise per Week: 3 days    Minutes of Exercise per Session: 20 min  Stress: Stress Concern Present (09/14/2021)   [employer] of Occupational Health - Occupational Stress Questionnaire    Feeling of Stress : To some extent  Social Connections: Socially Isolated (09/14/2021)   Social Connection and Isolation Panel [NHANES]    Frequency of Communication with Friends and Family: More than three times a week    Frequency of Social Gatherings with Friends and Family: Twice a week    Attends Religious Services: Never    Database administrator or Organizations: No    Attends Banker Meetings: Never    Marital Status: Divorced    Tobacco Counseling Counseling given: Not Answered Tobacco comments: smoking cesaation materials not required   Clinical Intake:                 Diabetic?no         Activities of Daily Living    09/21/2022    8:53 AM  In your present state of health, do you have any difficulty performing the following activities:  Hearing? 0  Vision? 1  Difficulty concentrating or making decisions? 0  Walking or climbing stairs? 0  Dressing or bathing? 0  Doing errands, shopping? 1    Patient Care Team: Danelle Berry, PA-C as PCP - General (Family Medicine)  Indicate any recent Medical Services you may have received from other than [facility] providers in the past year (date may be approximate).     Assessment:   This is a routine wellness examination for [place].  Hearing/Vision screen No results found.  Dietary issues and exercise activities discussed:     Goals Addressed   None    Depression Screen    09/21/2022    8:53 AM 09/14/2021    1:52 PM 09/14/2021    10:48 AM 06/13/2021    3:42 PM 02/28/2021   10:59 AM 02/20/2021    1:19 PM 01/23/2021    3:46 PM  PHQ 2/9 Scores  PHQ - 2 Score 2 2 1  0 0 0 0  PHQ- 9 Score 2 3 1  0 3 0 0    Fall Risk    09/21/2022    8:53 AM 09/14/2021    1:52 PM 09/14/2021   10:51 AM 06/13/2021    3:42 PM 02/28/2021   10:59 AM  Fall Risk   Falls in the past year? 0 0 0 0 0  Number falls in past yr: 0  0 0 0  Injury with Fall? 0  0 0 0  Risk for fall due to : No Fall Risks No Fall Risks No Fall Risks  No Fall Risks  Follow up Falls prevention discussed Falls prevention discussed Falls prevention discussed  Falls prevention discussed    FALL RISK PREVENTION PERTAINING TO THE HOME:  Any stairs in or around the home? Yes  If so, are there any without handrails? Yes  Home free of loose throw rugs in walkways, pet beds, electrical cords, etc? Yes  Adequate lighting in your home to reduce risk of falls? Yes   ASSISTIVE DEVICES UTILIZED TO PREVENT FALLS:  Life alert? No  Use of a cane, walker or w/c? No  Grab bars in the bathroom? No  Shower chair or bench in shower? No  Elevated toilet seat or a handicapped toilet? No   TIMED UP AND GO:  Was the test performed? Yes .  Length of time to ambulate 10 feet: 8 sec.   Gait steady and fast without use of assistive device  Cognitive Function:        09/08/2019    9:46 AM 07/23/2017   10:53 AM  6CIT Screen  What Year? 0 points 0 points  What month? 0 points 0 points  What time? 0 points 0 points  Count back from 20 0 points 0 points  Months in reverse 0 points 0 points  Repeat phrase 0 points 2 points  Total Score 0 points 2 points    Immunizations Immunization History  Administered Date(s) Administered   Fluad Quad(high Dose 65+) 12/26/2018, 06/13/2021   Influenza, High Dose Seasonal PF 02/21/2017, 01/10/2018   Influenza-Unspecified 01/20/2013   PFIZER(Purple Top)SARS-COV-2 Vaccination 05/13/2019, 06/03/2019, 02/24/2020   Pneumococcal Conjugate-13  11/02/2013   Pneumococcal Polysaccharide-23 03/30/2011, 11/02/2013   Tdap 01/18/2011   Zoster Recombinat (Shingrix) 06/13/2021   Zoster, Live 06/14/2013    TDAP status: Up to date  Flu Vaccine status: Up to date  Pneumococcal vaccine status: Up to date  Covid-19 vaccine status: Completed vaccines  Qualifies for Shingles Vaccine? Yes   Zostavax completed Yes   Shingrix Completed?: Yes  Screening Tests Health Maintenance  Topic Date Due   DEXA SCAN  01/31/2019   DTaP/Tdap/Td (2 - Td or Tdap) 01/17/2021   Medicare Annual Wellness (AWV)  09/15/2022   Colonoscopy  11/28/2022   COVID-19 Vaccine (4 - 2023-24 season) 10/07/2022 (Originally 12/15/2021)   Zoster Vaccines- Shingrix (2 of 2) 12/22/2022 (Originally 08/08/2021)   MAMMOGRAM  09/21/2023 (Originally 06/27/2018)   INFLUENZA VACCINE  11/15/2022   Pneumonia Vaccine 59+ Years old  Completed   Hepatitis C Screening  Completed   HPV VACCINES  Aged Out    Health Maintenance  Health Maintenance Due  Topic Date Due   DEXA SCAN  01/31/2019   DTaP/Tdap/Td (2 - Td or Tdap) 01/17/2021   Medicare Annual Wellness (AWV)  09/15/2022   Colonoscopy  11/28/2022    Colorectal cancer screening: No longer required.   Mammogram status: No longer required due to age.  Bone Density status: Completed yes. Results reflect: Bone density results: OSTEOPOROSIS. Repeat every 5 years.  Lung Cancer Screening: (Low Dose CT Chest recommended if Age 13-80 years, 30 pack-year currently smoking OR have quit w/in 15years.) does not qualify.   Lung Cancer Screening Referral: no Additional Screening:  Hepatitis C Screening: does not qualify; Completed yes  Vision Screening: Recommended annual ophthalmology exams for early detection of glaucoma and other disorders of the eye. Is the patient up to date with their annual eye exam?  Yes  Who is the provider or what is the name of the office in which the patient attends annual eye exams? Dr Dellie Burns If  pt is not established with a provider, would they like to be referred to a provider to establish care? No .   Dental Screening: Recommended annual dental exams for proper oral hygiene  Community Resource Referral / Chronic Care Management: CRR required this visit?  No   CCM required this visit?  No      Plan:     I have personally reviewed and noted the following in the patient's chart:   Medical and social history Use of alcohol, tobacco or illicit drugs  Current medications and supplements including opioid prescriptions. Patient is not currently  taking opioid prescriptions. Functional ability and status Nutritional status Physical activity Advanced directives List of other physicians Hospitalizations, surgeries, and ER visits in previous 12 months Vitals Screenings to include cognitive, depression, and falls Referrals and appointments  In addition, I have reviewed and discussed with patient certain preventive protocols, quality metrics, and best practice recommendations. A written personalized care plan for preventive services as well as general preventive health recommendations were provided to patient.     Sue Lush, LPN   04/21/1094   Nurse Notes: The patient states she is doing well and has no concerns or questions at this time.

## 2022-09-21 NOTE — Patient Instructions (Signed)
Please schedule and Medicare Well visit which is covered 100% by insurance and which gives [place] the time to do all your health maintenance and arrange screenings/testing that you are due for  Even though you are not on a lot of prescription medications, you have enough medical history and conditions which we need to monitor with regular office visits and we will need to see you for routine office visits at least 2 times a year to be able to properly care for you. Please schedule a 6 month follow up office visit.    The following topics will be addressed at your medicare well visit. Health Maintenance  Topic Date Due   DEXA scan (bone density measurement)  01/31/2019   DTaP/Tdap/Td vaccine (2 - Td or Tdap) 01/17/2021   Medicare Annual Wellness Visit  09/15/2022   Colon Cancer Screening  11/28/2022   COVID-19 Vaccine (4 - 2023-24 season) 10/07/2022*   Zoster (Shingles) Vaccine (2 of 2) 12/22/2022*   Mammogram  09/21/2023*   Flu Shot  11/15/2022   Pneumonia Vaccine  Completed   Hepatitis C Screening  Completed   HPV Vaccine  Aged Out  *Topic was postponed. The date shown is not the original due date.

## 2022-09-21 NOTE — Progress Notes (Unsigned)
Name: Angela Kelly   MRN: [id number]    DOB: [birth year]   Date:09/21/2022       Progress Note  Chief Complaint  Patient presents with   Follow-up     Subjective:   Angela Kelly is a 77 y.o. female, presents to clinic for routine f/up  Last OV just over a year ago  Hx of hypothyroid, allergies and HLD with statin myopathy  Hypothyroidism: History: {thyroid dx:310574} Current Medication Regimen: ***mcg synthroid Takes medicine  Current Symptoms: {thyroid symptoms:315703} Most recent results are below; we {Actions; is/will/was/not:16608} repeating labs today. Lab Results  Component Value Date   TSH 1.67 09/14/2021    Hyperlipidemia:  crestor 5 mg, she wasn't sure if she was taking but medication is in her bag and she does state she has taken w/o known SE Currently treated with ***, pt reports *** med compliance Last Lipids: Lab Results  Component Value Date   CHOL 253 (H) 09/14/2021   HDL 66 09/14/2021   LDLCALC 164 (H) 09/14/2021   TRIG 111 09/14/2021   CHOLHDL 3.8 09/14/2021   - {ACTIONS;DENIES/REPORTS:21021675::"Denies"}: Chest pain, shortness of breath, myalgias, claudication Known aortic atherosclerosis not on statin   Seasonal allergies, severe nickle allergy she sees dermatology when needed   Neck pain      Health Maintenance  Topic Date Due   DTaP/Tdap/Td vaccine (2 - Td or Tdap) 01/17/2021   Medicare Annual Wellness Visit  09/15/2022   Colon Cancer Screening  11/28/2022   COVID-19 Vaccine (4 - 2023-24 season) 10/07/2022*   Zoster (Shingles) Vaccine (2 of 2) 12/22/2022*   Mammogram  09/21/2023*   Flu Shot  11/15/2022   Pneumonia Vaccine  Completed   DEXA scan (bone density measurement)  Completed   Hepatitis C Screening  Completed   HPV Vaccine  Aged Out  *Topic was postponed. The date shown is not the original due date.      Current Outpatient Medications:    acetaminophen (TYLENOL) 500 MG tablet, Take 500 mg by mouth every 6 (six) hours  as needed., Disp: , Rfl:    aspirin EC 81 MG tablet, Take 1 tablet (81 mg total) by mouth daily., Disp: 30 tablet, Rfl: 11   calcium carbonate (TUMS - DOSED IN MG ELEMENTAL CALCIUM) 500 MG chewable tablet, Chew 1-3 tablets by mouth daily as needed for indigestion or heartburn., Disp: , Rfl:    ciclesonide (OMNARIS) 50 MCG/ACT nasal spray, Place into the nose., Disp: , Rfl:    desonide (DESOWEN) 0.05 % cream, APPLY TO AFFECTED AREA TWICE A DAY, Disp: 30 g, Rfl: 0   diphenhydrAMINE (BENADRYL) 25 mg capsule, Take 25 mg by mouth See admin instructions. Take 25 mg every night and may take an additional 25 mg dose during the day as needed for allergies, Disp: , Rfl:    hydrocortisone cream 1 %, Apply 1 application topically 2 (two) times daily., Disp: , Rfl:    levothyroxine (SYNTHROID) 75 MCG tablet, Take 1 tablet (75 mcg total) by mouth daily before breakfast., Disp: 90 tablet, Rfl: 3   mometasone (NASONEX) 50 MCG/ACT nasal spray, PLACE 2 SPRAYS INTO THE NOSE DAILY., Disp: 51 each, Rfl: 1   Multiple Vitamin (MULTIVITAMIN) capsule, Take by mouth., Disp: , Rfl:    Probiotic Product (PROBIOTIC DAILY PO), Take 1 capsule by mouth daily as needed (when taking antibiotics). , Disp: , Rfl:    rosuvastatin (CRESTOR) 5 MG tablet, Take 1 tablet (5 mg total) by mouth daily., Disp:  90 tablet, Rfl: 3   cromolyn (OPTICROM) 4 % ophthalmic solution, PLACE 1 DROP INTO BOTH EYES 4 (FOUR) TIMES DAILY AS NEEDED. (Patient not taking: Reported on 09/21/2022), Disp: 30 mL, Rfl: 1   PAZEO 0.7 % SOLN, Place 1 drop into both eyes at bedtime.  (Patient not taking: Reported on 09/21/2022), Disp: , Rfl: 5  Patient Active Problem List   Diagnosis Date Noted   Side pain 10/10/2020   Gallstones 05/15/2018   Aortic atherosclerosis (HCC) 05/06/2018   Thoracogenic scoliosis of thoracic region 10/16/2017   Gall bladder polyp 09/17/2017   Essential hypertension 07/31/2017   GERD (gastroesophageal reflux disease) 07/31/2017   Full code  status 07/31/2017   Myalgia due to HMG CoA reductase inhibitor 07/31/2017   Abnormal mammogram of left breast 01/18/2015   OSA (obstructive sleep apnea)    Hyperlipidemia    Thyroid disease    Cataract of both eyes    Ocular rosacea    Bunion    Hammer toe    Allergic rhinitis    Hypothyroidism 08/03/2014   Left thyroid nodule 08/03/2014   IFG (impaired fasting glucose) 08/03/2014   Osteopenia 08/03/2014   SOB (shortness of breath) 11/05/2013   Anemia, unspecified 08/21/2013   Depression 08/21/2013   Osteoarthritis 08/21/2013   Osteoporosis 08/21/2013    Past Surgical History:  Procedure Laterality Date   BACK SURGERY     lumbar laminectomy   BIOPSY THYROID     BREAST CYST ASPIRATION Left    BREAST SURGERY     BUNIONECTOMY Right    COLONOSCOPY WITH PROPOFOL N/A 11/27/2017   Procedure: COLONOSCOPY WITH PROPOFOL;  Surgeon: Pasty Spillers, MD;  Location: [facility] ENDOSCOPY;  Service: Endoscopy;  Laterality: N/A;   EYE SURGERY     SPINE SURGERY      Family History  Problem Relation Age of Onset   Asthma Mother    Hypertension Mother    Diabetes Mother    Cancer Mother        Leukemia   Peripheral vascular disease Mother    Osteoporosis Mother    Glaucoma Mother    Arthritis Mother    Varicose Veins Mother    Heart disease Father        coronary artery occlusion   Early death Father    Heart attack Father    Hypertension Sister    Diabetes Sister    Glaucoma Sister    Asthma Sister    Hearing loss Sister    Kidney disease Sister    Vision loss Sister    Varicose Veins Sister    Diabetes Maternal Grandmother    Heart disease Maternal Grandmother    Colon cancer Maternal Uncle    Lung cancer Paternal Uncle    Early death Paternal Uncle    Diabetes Maternal Grandfather     Social History   Tobacco Use   Smoking status: Never   Smokeless tobacco: Never   Tobacco comments:    smoking cesaation materials not required  Vaping Use   Vaping Use: Never  used  Substance Use Topics   Alcohol use: Yes    Alcohol/week: 0.0 standard drinks of alcohol    Comment: Wine (very rarely).   Drug use: No     Allergies  Allergen Reactions   Nickel Rash    Almost went blind when wearing nickel eye glasses   Codeine Other (See Comments)    Ears ringing   Lovastatin Rash   Tape Rash  Health Maintenance  Topic Date Due   DTaP/Tdap/Td (2 - Td or Tdap) 01/17/2021   Medicare Annual Wellness (AWV)  09/15/2022   Colonoscopy  11/28/2022   COVID-19 Vaccine (4 - 2023-24 season) 10/07/2022 (Originally 12/15/2021)   Zoster Vaccines- Shingrix (2 of 2) 12/22/2022 (Originally 08/08/2021)   MAMMOGRAM  09/21/2023 (Originally 06/27/2018)   INFLUENZA VACCINE  11/15/2022   Pneumonia Vaccine 45+ Years old  Completed   DEXA SCAN  Completed   Hepatitis C Screening  Completed   HPV VACCINES  Aged Out    Chart Review Today: ***  Review of Systems   Objective:   Vitals:   09/21/22 0900  BP: 128/76  Pulse: 91  Resp: 16  SpO2: 98%  Weight: 104 lb (47.2 kg)  Height: 4\' 11"  (1.499 m)    Body mass index is 21.01 kg/m.  Physical Exam      Assessment & Plan:   Problem List Items Addressed This Visit   None    No follow-ups on file.   Danelle Berry, PA-C 09/21/22 9:13 AM

## 2022-09-21 NOTE — Patient Instructions (Signed)
Angela Kelly , Thank you for taking time to come for your Medicare Wellness Visit. I appreciate your ongoing commitment to your health goals. Please review the following plan we discussed and let me know if I can assist you in the future.   These are the goals we discussed:  Goals      DIET - INCREASE WATER INTAKE     Recommend to drink at least 6-8 8oz glasses of water per day.     Increase physical activity     Recommend increasing physical activity to at least 3 days per week        This is a list of the screening recommended for you and due dates:  Health Maintenance  Topic Date Due   DEXA scan (bone density measurement)  01/31/2019   DTaP/Tdap/Td vaccine (2 - Td or Tdap) 01/17/2021   Colon Cancer Screening  11/28/2022   COVID-19 Vaccine (4 - 2023-24 season) 10/07/2022*   Zoster (Shingles) Vaccine (2 of 2) 12/22/2022*   Mammogram  09/21/2023*   Flu Shot  11/15/2022   Medicare Annual Wellness Visit  09/21/2023   Pneumonia Vaccine  Completed   Hepatitis C Screening  Completed   HPV Vaccine  Aged Out  *Topic was postponed. The date shown is not the original due date.    Advanced directives: no  Conditions/risks identified: low falls risk  Next appointment: Follow up in one year for your annual wellness visit 09/27/2023 @ 11:15am in person   Preventive Care 65 Years and Older, Female Preventive care refers to lifestyle choices and visits with your health care provider that can promote health and wellness. What does preventive care include? A yearly physical exam. This is also called an annual well check. Dental exams once or twice a year. Routine eye exams. Ask your health care provider how often you should have your eyes checked. Personal lifestyle choices, including: Daily care of your teeth and gums. Regular physical activity. Eating a healthy diet. Avoiding tobacco and drug use. Limiting alcohol use. Practicing safe sex. Taking low-dose aspirin every day. Taking  vitamin and mineral supplements as recommended by your health care provider. What happens during an annual well check? The services and screenings done by your health care provider during your annual well check will depend on your age, overall health, lifestyle risk factors, and family history of disease. Counseling  Your health care provider may ask you questions about your: Alcohol use. Tobacco use. Drug use. Emotional well-being. Home and relationship well-being. Sexual activity. Eating habits. History of falls. Memory and ability to understand (cognition). Work and work Astronomer. Reproductive health. Screening  You may have the following tests or measurements: Height, weight, and BMI. Blood pressure. Lipid and cholesterol levels. These may be checked every 5 years, or more frequently if you are over 79 years old. Skin check. Lung cancer screening. You may have this screening every year starting at age 79 if you have a 30-pack-year history of smoking and currently smoke or have quit within the past 15 years. Fecal occult blood test (FOBT) of the stool. You may have this test every year starting at age 79. Flexible sigmoidoscopy or colonoscopy. You may have a sigmoidoscopy every 5 years or a colonoscopy every 10 years starting at age 79. Hepatitis C blood test. Hepatitis B blood test. Sexually transmitted disease (STD) testing. Diabetes screening. This is done by checking your blood sugar (glucose) after you have not eaten for a while (fasting). You may have this done  every 1-3 years. Bone density scan. This is done to screen for osteoporosis. You may have this done starting at age 79. Mammogram. This may be done every 1-2 years. Talk to your health care provider about how often you should have regular mammograms. Talk with your health care provider about your test results, treatment options, and if necessary, the need for more tests. Vaccines  Your health care provider may  recommend certain vaccines, such as: Influenza vaccine. This is recommended every year. Tetanus, diphtheria, and acellular pertussis (Tdap, Td) vaccine. You may need a Td booster every 10 years. Zoster vaccine. You may need this after age 79. Pneumococcal 13-valent conjugate (PCV13) vaccine. One dose is recommended after age 79. Pneumococcal polysaccharide (PPSV23) vaccine. One dose is recommended after age 65. Talk to your health care provider about which screenings and vaccines you need and how often you need them. This information is not intended to replace advice given to you by your health care provider. Make sure you discuss any questions you have with your health care provider. Document Released: 04/29/2015 Document Revised: 12/21/2015 Document Reviewed: 02/01/2015 Elsevier Interactive Patient Education  2017 [employer].  Fall Prevention in the Home Falls can cause injuries. They can happen to people of all ages. There are many things you can do to make your home safe and to help prevent falls. What can I do on the outside of my home? Regularly fix the edges of walkways and driveways and fix any cracks. Remove anything that might make you trip as you walk through a door, such as a raised step or threshold. Trim any bushes or trees on the path to your home. Use bright outdoor lighting. Clear any walking paths of anything that might make someone trip, such as rocks or tools. Regularly check to see if handrails are loose or broken. Make sure that both sides of any steps have handrails. Any raised decks and porches should have guardrails on the edges. Have any leaves, snow, or ice cleared regularly. Use sand or salt on walking paths during winter. Clean up any spills in your garage right away. This includes oil or grease spills. What can I do in the bathroom? Use night lights. Install grab bars by the toilet and in the tub and shower. Do not use towel bars as grab bars. Use non-skid  mats or decals in the tub or shower. If you need to sit down in the shower, use a plastic, non-slip stool. Keep the floor dry. Clean up any water that spills on the floor as soon as it happens. Remove soap buildup in the tub or shower regularly. Attach bath mats securely with double-sided non-slip rug tape. Do not have throw rugs and other things on the floor that can make you trip. What can I do in the bedroom? Use night lights. Make sure that you have a light by your bed that is easy to reach. Do not use any sheets or blankets that are too big for your bed. They should not hang down onto the floor. Have a firm chair that has side arms. You can use this for support while you get dressed. Do not have throw rugs and other things on the floor that can make you trip. What can I do in the kitchen? Clean up any spills right away. Avoid walking on wet floors. Keep items that you use a lot in easy-to-reach places. If you need to reach something above you, use a strong step stool that has  a grab bar. Keep electrical cords out of the way. Do not use floor polish or wax that makes floors slippery. If you must use wax, use non-skid floor wax. Do not have throw rugs and other things on the floor that can make you trip. What can I do with my stairs? Do not leave any items on the stairs. Make sure that there are handrails on both sides of the stairs and use them. Fix handrails that are broken or loose. Make sure that handrails are as long as the stairways. Check any carpeting to make sure that it is firmly attached to the stairs. Fix any carpet that is loose or worn. Avoid having throw rugs at the top or bottom of the stairs. If you do have throw rugs, attach them to the floor with carpet tape. Make sure that you have a light switch at the top of the stairs and the bottom of the stairs. If you do not have them, ask someone to add them for you. What else can I do to help prevent falls? Wear shoes  that: Do not have high heels. Have rubber bottoms. Are comfortable and fit you well. Are closed at the toe. Do not wear sandals. If you use a stepladder: Make sure that it is fully opened. Do not climb a closed stepladder. Make sure that both sides of the stepladder are locked into place. Ask someone to hold it for you, if possible. Clearly mark and make sure that you can see: Any grab bars or handrails. First and last steps. Where the edge of each step is. Use tools that help you move around (mobility aids) if they are needed. These include: Canes. Walkers. Scooters. Crutches. Turn on the lights when you go into a dark area. Replace any light bulbs as soon as they burn out. Set up your furniture so you have a clear path. Avoid moving your furniture around. If any of your floors are uneven, fix them. If there are any pets around you, be aware of where they are. Review your medicines with your doctor. Some medicines can make you feel dizzy. This can increase your chance of falling. Ask your doctor what other things that you can do to help prevent falls. This information is not intended to replace advice given to you by your health care provider. Make sure you discuss any questions you have with your health care provider. Document Released: 01/27/2009 Document Revised: 09/08/2015 Document Reviewed: 05/07/2014 Elsevier Interactive Patient Education  2017 [employer].

## 2022-09-22 LAB — COMPLETE METABOLIC PANEL WITH GFR
AG Ratio: 1.6 (calc) (ref 1.0–2.5)
ALT: 16 U/L (ref 6–29)
AST: 20 U/L (ref 10–35)
Albumin: 4.1 g/dL (ref 3.6–5.1)
Alkaline phosphatase (APISO): 98 U/L (ref 37–153)
BUN: 9 mg/dL (ref 7–25)
CO2: 30 mmol/L (ref 20–32)
Calcium: 9.4 mg/dL (ref 8.6–10.4)
Chloride: 103 mmol/L (ref 98–110)
Creat: 0.85 mg/dL (ref 0.60–1.00)
Globulin: 2.5 g/dL (calc) (ref 1.9–3.7)
Glucose, Bld: 91 mg/dL (ref 65–99)
Potassium: 5 mmol/L (ref 3.5–5.3)
Sodium: 141 mmol/L (ref 135–146)
Total Bilirubin: 0.4 mg/dL (ref 0.2–1.2)
Total Protein: 6.6 g/dL (ref 6.1–8.1)
eGFR: 70 mL/min/{1.73_m2} (ref >=60)

## 2022-09-22 LAB — LIPID PANEL
Cholesterol: 204 mg/dL — ABNORMAL HIGH (ref <200)
HDL: 62 mg/dL (ref >=50)
LDL Cholesterol (Calc): 111 mg/dL (calc) — ABNORMAL HIGH
Non-HDL Cholesterol (Calc): 142 mg/dL (calc) — ABNORMAL HIGH (ref <130)
Total CHOL/HDL Ratio: 3.3 (calc) (ref <5.0)
Triglycerides: 184 mg/dL — ABNORMAL HIGH (ref <150)

## 2022-09-22 LAB — CBC WITH DIFFERENTIAL/PLATELET
Absolute Monocytes: 510 cells/uL (ref 200–950)
Basophils Absolute: 32 cells/uL (ref 0–200)
Basophils Relative: 0.4 %
Eosinophils Absolute: 162 cells/uL (ref 15–500)
Eosinophils Relative: 2 %
HCT: 45.4 % — ABNORMAL HIGH (ref 35.0–45.0)
Hemoglobin: 14.8 g/dL (ref 11.7–15.5)
Lymphs Abs: 1385 cells/uL (ref 850–3900)
MCH: 28 pg (ref 27.0–33.0)
MCHC: 32.6 g/dL (ref 32.0–36.0)
MCV: 86 fL (ref 80.0–100.0)
MPV: 9.6 fL (ref 7.5–12.5)
Monocytes Relative: 6.3 %
Neutro Abs: 6010 cells/uL (ref 1500–7800)
Neutrophils Relative %: 74.2 %
Platelets: 455 10*3/uL — ABNORMAL HIGH (ref 140–400)
RBC: 5.28 10*6/uL — ABNORMAL HIGH (ref 3.80–5.10)
RDW: 13.4 % (ref 11.0–15.0)
Total Lymphocyte: 17.1 %
WBC: 8.1 10*3/uL (ref 3.8–10.8)

## 2022-09-22 LAB — HEMOGLOBIN A1C
Hgb A1c MFr Bld: 6 % of total Hgb — ABNORMAL HIGH (ref <5.7)
Mean Plasma Glucose: 126 mg/dL
eAG (mmol/L): 7 mmol/L

## 2022-09-22 LAB — TSH: TSH: 3.02 mIU/L (ref 0.40–4.50)

## 2022-09-27 ENCOUNTER — Encounter: Payer: Self-pay | Admitting: Family Medicine

## 2022-09-27 ENCOUNTER — Other Ambulatory Visit: Payer: Self-pay | Admitting: Family Medicine

## 2022-09-27 DIAGNOSIS — E782 Mixed hyperlipidemia: Secondary | ICD-10-CM

## 2022-09-27 DIAGNOSIS — Z872 Personal history of diseases of the skin and subcutaneous tissue: Secondary | ICD-10-CM | POA: Insufficient documentation

## 2022-09-27 DIAGNOSIS — E039 Hypothyroidism, unspecified: Secondary | ICD-10-CM

## 2022-09-27 MED ORDER — LEVOTHYROXINE SODIUM 75 MCG PO TABS
75.0000 ug | ORAL_TABLET | Freq: Every day | ORAL | 1 refills | Status: DC
Start: 2022-09-27 — End: 2023-03-26

## 2022-09-27 MED ORDER — DOXYCYCLINE MONOHYDRATE 50 MG PO CAPS: 50.0000 mg | ORAL_CAPSULE | Freq: Every day | ORAL | 1 refills | Status: DC

## 2022-09-27 MED ORDER — ROSUVASTATIN CALCIUM 5 MG PO TABS
5.0000 mg | ORAL_TABLET | Freq: Every day | ORAL | 3 refills | Status: DC
Start: 2022-09-27 — End: 2023-03-27

## 2022-09-27 NOTE — Assessment & Plan Note (Signed)
On crestor w/o SE

## 2022-09-27 NOTE — Assessment & Plan Note (Signed)
Managed with diet/lifestyle, not on meds BP Readings from Last 3 Encounters:  09/21/22 126/78  09/21/22 128/76  09/14/21 110/72  Bp at goal

## 2022-09-27 NOTE — Assessment & Plan Note (Signed)
Pt endorses no concerning sx, she has been on stable dosing for several years Last labs were in normal range over a year ago, recheck labs today

## 2022-09-27 NOTE — Assessment & Plan Note (Signed)
Sx currently managed with benadryl - advised she should not take this for allergies or allergic rxns due to SE and risk with long term use, recommended instead 24 hour 2nd generation antihistamines Also uses cromolyn and nasonex

## 2022-09-27 NOTE — Assessment & Plan Note (Signed)
Prior labs with elevated glucose, will check A1C

## 2022-09-27 NOTE — Assessment & Plan Note (Signed)
Pt stated she was not on meds, but then we looked at all her medication bottles and she has crestor and it taking and was not aware of what it was for.   No SE, no myalgias, fatigue or jaundice Recheck labs and LFTs She has tolerated med, which was indicated for atherosclerotic disease and with HLD and ASCVD score Encouraged her to stay on it

## 2022-09-27 NOTE — Assessment & Plan Note (Signed)
She tolerated crestor

## 2022-09-27 NOTE — Assessment & Plan Note (Signed)
Sx well controlled without Rx meds

## 2023-03-27 ENCOUNTER — Ambulatory Visit (INDEPENDENT_AMBULATORY_CARE_PROVIDER_SITE_OTHER): Payer: Medicare Other | Admitting: Nurse Practitioner

## 2023-03-27 ENCOUNTER — Encounter: Payer: Self-pay | Admitting: Nurse Practitioner

## 2023-03-27 VITALS — BP 130/78 | HR 98 | Temp 97.7°F | Resp 16 | Ht 59.0 in | Wt 98.9 lb

## 2023-03-27 DIAGNOSIS — K219 Gastro-esophageal reflux disease without esophagitis: Secondary | ICD-10-CM | POA: Diagnosis not present

## 2023-03-27 DIAGNOSIS — R7303 Prediabetes: Secondary | ICD-10-CM

## 2023-03-27 DIAGNOSIS — R21 Rash and other nonspecific skin eruption: Secondary | ICD-10-CM

## 2023-03-27 DIAGNOSIS — I10 Essential (primary) hypertension: Secondary | ICD-10-CM | POA: Diagnosis not present

## 2023-03-27 DIAGNOSIS — Z780 Asymptomatic menopausal state: Secondary | ICD-10-CM | POA: Diagnosis not present

## 2023-03-27 DIAGNOSIS — R6889 Other general symptoms and signs: Secondary | ICD-10-CM

## 2023-03-27 DIAGNOSIS — E785 Hyperlipidemia, unspecified: Secondary | ICD-10-CM | POA: Diagnosis not present

## 2023-03-27 DIAGNOSIS — I700 Atherosclerosis of aorta: Secondary | ICD-10-CM | POA: Diagnosis not present

## 2023-03-27 DIAGNOSIS — D582 Other hemoglobinopathies: Secondary | ICD-10-CM

## 2023-03-27 DIAGNOSIS — Z13820 Encounter for screening for osteoporosis: Secondary | ICD-10-CM

## 2023-03-27 DIAGNOSIS — D75839 Thrombocytosis, unspecified: Secondary | ICD-10-CM

## 2023-03-27 DIAGNOSIS — Z872 Personal history of diseases of the skin and subcutaneous tissue: Secondary | ICD-10-CM

## 2023-03-27 DIAGNOSIS — G4733 Obstructive sleep apnea (adult) (pediatric): Secondary | ICD-10-CM

## 2023-03-27 DIAGNOSIS — E039 Hypothyroidism, unspecified: Secondary | ICD-10-CM

## 2023-03-27 DIAGNOSIS — E782 Mixed hyperlipidemia: Secondary | ICD-10-CM | POA: Diagnosis not present

## 2023-03-27 DIAGNOSIS — M542 Cervicalgia: Secondary | ICD-10-CM | POA: Diagnosis not present

## 2023-03-27 MED ORDER — DOXYCYCLINE MONOHYDRATE 50 MG PO CAPS
50.0000 mg | ORAL_CAPSULE | Freq: Every day | ORAL | 1 refills | Status: AC
Start: 2023-03-27 — End: ?

## 2023-03-27 MED ORDER — LEVOTHYROXINE SODIUM 75 MCG PO TABS: 75.0000 ug | ORAL_TABLET | Freq: Every day | ORAL | 1 refills | Status: DC

## 2023-03-27 MED ORDER — ROSUVASTATIN CALCIUM 5 MG PO TABS: 5.0000 mg | ORAL_TABLET | Freq: Every day | ORAL | 3 refills | Status: DC

## 2023-03-27 NOTE — Progress Notes (Signed)
BP 130/78   Pulse 98   Temp 97.7 F (36.5 C)   Resp 16   Ht 4\' 11"  (1.499 m)   Wt 98 lb 14.4 oz (44.9 kg)   SpO2 99%   BMI 19.98 kg/m    Subjective:    Patient ID: Angela Kelly, female    DOB: 06-07-1945, 77 y.o.   MRN: [id number]  HPI: Angela Kelly is a 77 y.o. female  Chief Complaint  Patient presents with   Medical Management of Chronic Issues    Discussed the use of AI scribe software for clinical note transcription with the patient, who gave verbal consent to proceed.  History of Present Illness   The patient, with a history of hypothyroidism, hyperlipidemia, hypertension, aortic atherosclerosis, GERD, sleep apnea, thrombocytosis, prediabetes, and rosacea, presents for a follow-up visit. She reports no new health issues and is generally feeling fine. She is compliant with her medications, including rosuvastatin 5mg  daily for hyperlipidemia and levothyroxine daily for hypothyroidism. She manages her GERD with dietary modifications and does not use a CPAP machine for her sleep apnea.  The patient also reports a chronic issue with her neck, which has been cracking and causing pain for the past year. She has been managing the pain with hot and cold compresses. She has a history of osteoporosis and is concerned about her calcium levels and overall bone health.  In addition, the patient's son has expressed concerns about potential dementia due to the patient's forgetfulness. However, the patient attributes this to being home alone most of the time and not getting out much. She denies any significant memory issues such as forgetting familiar people or getting lost while driving.       41/32/4401    8:29 AM 09/21/2022   11:31 AM 09/21/2022    8:53 AM  Depression screen PHQ 2/9  Decreased Interest 0 0 0  Down, Depressed, Hopeless 0 0 2  PHQ - 2 Score 0 0 2  Altered sleeping 0  0  Tired, decreased energy 0  0  Change in appetite 0  0  Feeling bad or failure about yourself   0  0  Trouble concentrating 0  0  Moving slowly or fidgety/restless   0  Suicidal thoughts 0  0  PHQ-9 Score 0  2  Difficult doing work/chores Not difficult at all         03/27/2023    8:51 AM  MMSE - Mini Mental State Exam  Orientation to time 5  Orientation to Place 5  Registration 3  Attention/ Calculation 5  Recall 2  Language- name 2 objects 2  Language- repeat 1  Language- follow 3 step command 3  Language- read & follow direction 1  Write a sentence 1  Copy design 1  Total score 29    Relevant past medical, surgical, family and social history reviewed and updated as indicated. Interim medical history since our last visit reviewed. Allergies and medications reviewed and updated.  Review of Systems  Constitutional: Negative for fever or weight change.  Respiratory: Negative for cough and shortness of breath.   Cardiovascular: Negative for chest pain or palpitations.  Gastrointestinal: Negative for abdominal pain, no bowel changes.  Musculoskeletal: Negative for gait problem or joint swelling.  Skin: Negative for rash.  Neurological: Negative for dizziness or headache.  No other specific complaints in a complete review of systems (except as listed in HPI above).      Objective:  BP 130/78   Pulse 98   Temp 97.7 F (36.5 C)   Resp 16   Ht 4\' 11"  (1.499 m)   Wt 98 lb 14.4 oz (44.9 kg)   SpO2 99%   BMI 19.98 kg/m   BP Readings from Last 3 Encounters:  03/27/23 130/78  09/21/22 126/78  09/21/22 128/76     Wt Readings from Last 3 Encounters:  03/27/23 98 lb 14.4 oz (44.9 kg)  09/21/22 104 lb (47.2 kg)  09/21/22 104 lb (47.2 kg)    Physical Exam  Constitutional: Patient appears well-developed and well-nourished.  No distress.  HEENT: head atraumatic, normocephalic, pupils equal and reactive to light, ears TMs clear, neck supple, throat within normal limits Cardiovascular: Normal rate, regular rhythm and normal heart sounds.  No murmur heard. No BLE  edema. Pulmonary/Chest: Effort normal and breath sounds normal. No respiratory distress. Abdominal: Soft.  There is no tenderness. Psychiatric: Patient has a normal mood and affect. behavior is normal. Judgment and thought content normal.  Results for orders placed or performed in visit on 09/21/22  CBC with Differential/Platelet  Result Value Ref Range   WBC 8.1 3.8 - 10.8 Thousand/uL   RBC 5.28 (H) 3.80 - 5.10 Million/uL   Hemoglobin 14.8 11.7 - 15.5 g/dL   HCT 40.9 (H) 81.1 - 91.4 %   MCV 86.0 80.0 - 100.0 fL   MCH 28.0 27.0 - 33.0 pg   MCHC 32.6 32.0 - 36.0 g/dL   RDW 78.2 95.6 - 21.3 %   Platelets 455 (H) 140 - 400 Thousand/uL   MPV 9.6 7.5 - 12.5 fL   Neutro Abs 6,010 1,500 - 7,800 cells/uL   Lymphs Abs 1,385 850 - 3,900 cells/uL   Absolute Monocytes 510 200 - 950 cells/uL   Eosinophils Absolute 162 15 - 500 cells/uL   Basophils Absolute 32 0 - 200 cells/uL   Neutrophils Relative % 74.2 %   Total Lymphocyte 17.1 %   Monocytes Relative 6.3 %   Eosinophils Relative 2.0 %   Basophils Relative 0.4 %  COMPLETE METABOLIC PANEL WITH GFR  Result Value Ref Range   Glucose, Bld 91 65 - 99 mg/dL   BUN 9 7 - 25 mg/dL   Creat 0.86 5.78 - 4.69 mg/dL   eGFR 70 > OR = 60 GE/XBM/8.41L2   BUN/Creatinine Ratio SEE NOTE: 6 - 22 (calc)   Sodium 141 135 - 146 mmol/L   Potassium 5.0 3.5 - 5.3 mmol/L   Chloride 103 98 - 110 mmol/L   CO2 30 20 - 32 mmol/L   Calcium 9.4 8.6 - 10.4 mg/dL   Total Protein 6.6 6.1 - 8.1 g/dL   Albumin 4.1 3.6 - 5.1 g/dL   Globulin 2.5 1.9 - 3.7 g/dL (calc)   AG Ratio 1.6 1.0 - 2.5 (calc)   Total Bilirubin 0.4 0.2 - 1.2 mg/dL   Alkaline phosphatase (APISO) 98 37 - 153 U/L   AST 20 10 - 35 U/L   ALT 16 6 - 29 U/L  Hemoglobin A1c  Result Value Ref Range   Hgb A1c MFr Bld 6.0 (H) <5.7 % of total Hgb   Mean Plasma Glucose 126 mg/dL   eAG (mmol/L) 7.0 mmol/L  Lipid panel  Result Value Ref Range   Cholesterol 204 (H) <200 mg/dL   HDL 62 > OR = 50 mg/dL    Triglycerides 440 (H) <150 mg/dL   LDL Cholesterol (Calc) 111 (H) mg/dL (calc)   Total CHOL/HDL Ratio 3.3 <5.0 (  calc)   Non-HDL Cholesterol (Calc) 142 (H) <130 mg/dL (calc)  TSH  Result Value Ref Range   TSH 3.02 0.40 - 4.50 mIU/L   Last lipids Lab Results  Component Value Date   CHOL 204 (H) 09/21/2022   HDL 62 09/21/2022   LDLCALC 111 (H) 09/21/2022   TRIG 184 (H) 09/21/2022   CHOLHDL 3.3 09/21/2022   Last hemoglobin A1c Lab Results  Component Value Date   HGBA1C 6.0 (H) 09/21/2022   Last thyroid functions Lab Results  Component Value Date   TSH 3.02 09/21/2022        Assessment & Plan:   Problem List Items Addressed This Visit       Cardiovascular and Mediastinum   Essential hypertension   Relevant Medications   rosuvastatin (CRESTOR) 5 MG tablet   Other Relevant Orders   CBC with Differential   COMPLETE METABOLIC PANEL WITH GFR   Aortic atherosclerosis (HCC)   Relevant Medications   rosuvastatin (CRESTOR) 5 MG tablet   Other Relevant Orders   Lipid panel   COMPLETE METABOLIC PANEL WITH GFR     Respiratory   OSA (obstructive sleep apnea)     Digestive   GERD (gastroesophageal reflux disease)     Endocrine   Hypothyroidism - Primary   Relevant Medications   levothyroxine (SYNTHROID) 75 MCG tablet   Other Relevant Orders   TSH     Other   Hyperlipidemia   Relevant Medications   rosuvastatin (CRESTOR) 5 MG tablet   Other Relevant Orders   Lipid panel   COMPLETE METABOLIC PANEL WITH GFR   History of rosacea   Relevant Medications   doxycycline (MONODOX) 50 MG capsule   Other Visit Diagnoses     Thrombocytosis       Relevant Orders   CBC with Differential   Pre-diabetes       Relevant Orders   Hemoglobin A1C   Encounter for osteoporosis screening in asymptomatic postmenopausal patient       Relevant Orders   DG Bone Density   Facial rash       Relevant Medications   doxycycline (MONODOX) 50 MG capsule   Neck pain       Relevant  Orders   DG Cervical Spine Complete   Forgetfulness       Relevant Orders   Ambulatory referral to Neurology        Assessment and Plan    Neck Pain Chronic neck pain, possibly related to osteoporosis. Pain is managed with hot and cold compresses. -Order neck X-ray to assess for any structural abnormalities. -Continue current pain management strategy.  Memory Concerns Patient's son expressed concern about possible dementia. Patient admits to some forgetfulness but denies significant cognitive impairment. -Performed Mini-Mental State Examination (MMSE), which was within normal limits. -referral placed to neurology  Hypothyroidism Stable on Levothyroxine 75 mcg daily. -Continue current medication regimen.  Hyperlipidemia Stable on Rosuvastatin 5 mg daily. -Continue current medication regimen.  GERD Symptoms managed with dietary modifications. -Continue current management strategy.  Sleep Apnea Patient does not use CPAP machine. -No changes to current management strategy.  General Health Maintenance -Order labs for routine monitoring of chronic conditions. -Refill all current medications. -Follow-up after lab results and neck X-ray results are available.        Follow up plan: Return in about 6 months (around 09/25/2023) for follow up.

## 2023-03-28 ENCOUNTER — Other Ambulatory Visit: Payer: Self-pay | Admitting: Nurse Practitioner

## 2023-03-28 ENCOUNTER — Telehealth: Payer: Self-pay | Admitting: *Deleted

## 2023-03-28 DIAGNOSIS — E782 Mixed hyperlipidemia: Secondary | ICD-10-CM

## 2023-03-28 DIAGNOSIS — E039 Hypothyroidism, unspecified: Secondary | ICD-10-CM

## 2023-03-28 LAB — LIPID PANEL
Cholesterol: 230 mg/dL — ABNORMAL HIGH (ref <200)
HDL: 66 mg/dL (ref >=50)
LDL Cholesterol (Calc): 135 mg/dL — ABNORMAL HIGH
Non-HDL Cholesterol (Calc): 164 mg/dL — ABNORMAL HIGH (ref <130)
Total CHOL/HDL Ratio: 3.5 (calc) (ref <5.0)
Triglycerides: 156 mg/dL — ABNORMAL HIGH (ref <150)

## 2023-03-28 LAB — CBC WITH DIFFERENTIAL/PLATELET
Absolute Lymphocytes: 1489 {cells}/uL (ref 850–3900)
Absolute Monocytes: 489 {cells}/uL (ref 200–950)
Basophils Absolute: 51 {cells}/uL (ref 0–200)
Basophils Relative: 0.7 %
Eosinophils Absolute: 190 {cells}/uL (ref 15–500)
Eosinophils Relative: 2.6 %
HCT: 48.5 % — ABNORMAL HIGH (ref 35.0–45.0)
Hemoglobin: 16.1 g/dL — ABNORMAL HIGH (ref 11.7–15.5)
MCH: 28.5 pg (ref 27.0–33.0)
MCHC: 33.2 g/dL (ref 32.0–36.0)
MCV: 85.8 fL (ref 80.0–100.0)
MPV: 10.1 fL (ref 7.5–12.5)
Monocytes Relative: 6.7 %
Neutro Abs: 5081 {cells}/uL (ref 1500–7800)
Neutrophils Relative %: 69.6 %
Platelets: 431 10*3/uL — ABNORMAL HIGH (ref 140–400)
RBC: 5.65 10*6/uL — ABNORMAL HIGH (ref 3.80–5.10)
RDW: 13.7 % (ref 11.0–15.0)
Total Lymphocyte: 20.4 %
WBC: 7.3 10*3/uL (ref 3.8–10.8)

## 2023-03-28 LAB — COMPLETE METABOLIC PANEL WITH GFR
AG Ratio: 1.8 (calc) (ref 1.0–2.5)
ALT: 13 U/L (ref 6–29)
AST: 18 U/L (ref 10–35)
Albumin: 4.2 g/dL (ref 3.6–5.1)
Alkaline phosphatase (APISO): 83 U/L (ref 37–153)
BUN: 13 mg/dL (ref 7–25)
CO2: 31 mmol/L (ref 20–32)
Calcium: 9.7 mg/dL (ref 8.6–10.4)
Chloride: 102 mmol/L (ref 98–110)
Creat: 0.91 mg/dL (ref 0.60–1.00)
Globulin: 2.4 g/dL (ref 1.9–3.7)
Glucose, Bld: 90 mg/dL (ref 65–99)
Potassium: 5.5 mmol/L — ABNORMAL HIGH (ref 3.5–5.3)
Sodium: 140 mmol/L (ref 135–146)
Total Bilirubin: 0.4 mg/dL (ref 0.2–1.2)
Total Protein: 6.6 g/dL (ref 6.1–8.1)
eGFR: 64 mL/min/{1.73_m2} (ref >=60)

## 2023-03-28 LAB — HEMOGLOBIN A1C
Hgb A1c MFr Bld: 5.7 %{Hb} — ABNORMAL HIGH (ref <5.7)
Mean Plasma Glucose: 117 mg/dL
eAG (mmol/L): 6.5 mmol/L

## 2023-03-28 LAB — TSH: TSH: 8.81 m[IU]/L — ABNORMAL HIGH (ref 0.40–4.50)

## 2023-03-28 MED ORDER — ROSUVASTATIN CALCIUM 10 MG PO TABS: 10.0000 mg | ORAL_TABLET | Freq: Every day | ORAL | 1 refills | Status: AC

## 2023-03-28 MED ORDER — LEVOTHYROXINE SODIUM 75 MCG PO TABS: 75.0000 ug | ORAL_TABLET | Freq: Every day | ORAL | 1 refills | Status: AC

## 2023-03-28 NOTE — Telephone Encounter (Signed)
Called w/no answer. Line kept ringing- unable to lvm

## 2023-03-28 NOTE — Telephone Encounter (Signed)
Called x2

## 2023-03-28 NOTE — Telephone Encounter (Signed)
Pt given lab results per notes of J. Zane Herald, FNP from 03/28/23  on 03/28/23. Pt verbalized understanding.  Patient reports she has been taking her cholesterol medication as ordered and understands to take thyroid medication one tablet every day except on Sunday take 2. Patient requesting refill on levothyroxine . Please advise how much more rosuvastatin to take.

## 2023-05-22 ENCOUNTER — Ambulatory Visit: Payer: Self-pay | Admitting: *Deleted

## 2023-05-22 NOTE — Telephone Encounter (Signed)
Pt daughter in law is asking for Sheliah Mends to give her a call. I also gave her the neurology dr name and number that the patient was referred to.

## 2023-05-22 NOTE — Telephone Encounter (Signed)
  Chief Complaint: Per daughter in law, Anette Che (on [place]) Jaymes's forgetfulness is getting much worse and is causing a great deal of issues within the family.  Symptoms: She is also noticing Larua is having Sundowner's.   Frequency: Gradually getting worse. Pertinent Negatives: Patient denies she has a problem per Leigh. Disposition: [] ED /[] Urgent Care (no appt availability in office) / [] Appointment(In office/virtual)/ []  [facility]/ [] Home Care/ [] Refused Recommended Disposition /[] [facility]/ [x]  Follow-up with PCP Additional Notes: Anette is seeking help but if Myley knows she made the phone call to her doctor she would be furious with me.   Anette is asking that it not be mentioned that Anette called into her doctor.   She is asking if someone can call Jakeya and see if she went to the neurologist, like a follow up call, but don't mention Leigh.   I did let Leigh know the provider would need/want to see Reilley again for further evaluation possibly.   Anette was agreeable to this but asked that Leigh not be mentioned to  [place].  See triage notes for details.

## 2023-05-22 NOTE — Telephone Encounter (Signed)
 Message from Clinton C sent at 05/22/2023 10:06 AM EST  Summary: contact req / behavioral health   The patient's daughter in law has called to request contact with a member of clinical staff when possible  The patient has concerns related to their behavioral health and memory  The patient has forgotten the work schedule of their son at times creating significant stress for the patient and their family  The patient's daughter in law would like to speak with their PCP when possible  Please contact the patient's daughter in law further when possible          Call History  Contact Date/Time Type Contact Phone/Fax By  05/22/2023 10:02 AM EST Phone (Incoming) Sims,Leigh (Emergency Contact) [phone] (H) Coley, Everette A   Reason for Disposition  New or worsening agitation or behavior problem (e.g., change from patient's normal pattern)    Daughter in law (on [place]) said if Paisli knew I called y'all she would be furious with me.   So please don't tell her about this phone call.  Answer Assessment - Initial Assessment Questions 1. MAIN CONCERN OR SYMPTOM:  What is your main concern right now? What questions do you have? What's the main symptom you're worried about? (e.g., confusion, memory loss)     I returned call to Anette Che, daughter in law.   Nicholl does not know I'm calling y'all Control And Instrumentation Engineer).  (On DPR)   She would be furious with me.  She is accusing me of being after her money.    I'm not.    I just care about her and this forgetfulness is getting worse and is causing big issues in the family.  We have a lot of concerns about her forgetfulness.   Here is an example of what she is doing.   I got a panicked voicemail from her about her son and all he did was go get hamburgers.   She had forgotten he went to pick up hamburgers and was in a panic that something was wrong with him or had happened to him.   We could not get her to understand he had gone to  pick up hamburgers and would be back in a few minutes.   She had forgotten he went to get the burgers and we had the hardest time making her understand that her son was fine and would be back in a few minutes.    This is the 2nd time she has had an issue with forgetfulness and it affecting the family and getting us  upset due to her behavior and forgetfulness.    She denies she has forgetfulness and that we are the ones with the problems. It's really messing up the family life around here.  Has anything been written in her chart about forgetfulness?  I let her know when she was in to see Mliss Spray, FNP on 03/27/2023 a note was written about her forgetfulness and a referral being put in for her to see a neurologist.    Nothing in the chart indicating she has seen a neurologist.  I'm stepping in on this situation because this is getting worse.   I discussed this with my husband.    I'm having issues  with my family because of Kodee's forgetfulness.   She is blaming me saying I'm just out for her money.   I'm staying out of this.    My husband will be handling this even though I'm on her DPR.  I'm not with her now.   I'm at work but called in because this forgetfulness has become a major issue for her and the whole family.    I let Leigh know Makyna would need to be seen in order for further steps to be taken and to be re-evaluated.   Anette was agreeable to this but said [place] is going to refuse an appt.   She will adamantly refuse saying she doesn't need to be seen.   If she finds out I called y'all she would be furious.   Can someone in the office call my mother in law and see if she has seen the neurologist?   Maybe act like it's a reminder call?   Please don't tell her I called in.  She would be furious with me.  I let Anette Che know I would pass this information along to Michelene Cower, PA-C and Mliss Spray, FNP since Mliss saw Cortnie last.    Anette said the office could call her back on her cell  phone at [phone] and that she would answer.   I keep my phone with me all the time.   I'm at work but I can answer.   2. ONSET:  When did the symptom start (or worsen)? (minutes, hours, days, weeks)     She was seen in Dec. 2024 and the forgetfulness was mentioned during that visit with an ambulatory referral to neurology given per the OV notes.         3. BETTER-SAME-WORSE: Are you (the patient) getting better, staying the same, or getting worse compared to the day you (they) were diagnosed or most recent hospital discharge ?     She is getting worse   I'm noticing she is getting Sundowner's too.    I know this is only going to get worse. 4. DIAGNOSIS: Was the dementia diagnosed by a doctor? If Yes, ask: When? (e.g., days, months, years ago)     Mliss Spray, FNP is aware of it from the Dec. 2024 OV.     5. MEDICINES: Has there been any change in medicines recently? (e.g., narcotics, antihistamines, benzodiazepines, etc.)     Not asked 6. OTHER SYMPTOMS: Are there any other symptoms? (e.g., fever, cough, pain, falling)     Anette is noticing Henya is having Sundowner's along with the increased forgetfulness and anger as a result of the forgetfulness. 7. SUPPORT: Document living circumstances and support (e.g., family, nursing home)     Yes she has great family support.   We all care about her but the forgetfulness is causing a great deal of issues for our family.  Protocols used: Dementia Symptoms and Questions-A-AH

## 2023-12-06 ENCOUNTER — Ambulatory Visit: Payer: Self-pay

## 2023-12-06 ENCOUNTER — Telehealth: Payer: Self-pay

## 2023-12-06 VITALS — BP 130/78 | Ht 59.0 in | Wt 100.0 lb

## 2023-12-06 DIAGNOSIS — Z0000 Encounter for general adult medical examination without abnormal findings: Secondary | ICD-10-CM | POA: Diagnosis not present

## 2023-12-06 NOTE — Progress Notes (Signed)
 Because this visit was a virtual/telehealth visit,  certain criteria was not obtained, such a blood pressure, CBG if applicable, and timed get up and go. Any medications not marked as taking were not mentioned during the medication reconciliation part of the visit. Any vitals not documented were not able to be obtained due to this being a telehealth visit or patient was unable to self-report a recent blood pressure reading due to a lack of equipment at home via telehealth. Vitals that have been documented are verbally provided by the patient.  This visit was performed by a medical professional under my direct supervision. I was immediately available for consultation/collaboration. I have reviewed and agree with the Annual Wellness Visit documentation.  Subjective:   Angela Kelly is a 78 y.o. who presents for a Medicare Wellness preventive visit.  As a reminder, Annual Wellness Visits don't include a physical exam, and some assessments may be limited, especially if this visit is performed virtually. We may recommend an in-person follow-up visit with your provider if needed.  Visit Complete: Virtual I connected with  Angela Kelly on 12/06/23 by a audio enabled telemedicine application and verified that I am speaking with the correct person using two identifiers.  Patient Location: Home  Provider Location: Home Office  I discussed the limitations of evaluation and management by telemedicine. The patient expressed understanding and agreed to proceed.  Vital Signs: Because this visit was a virtual/telehealth visit, some criteria may be missing or patient reported. Any vitals not documented were not able to be obtained and vitals that have been documented are patient reported.  VideoDeclined- This patient declined Librarian, academic. Therefore the visit was completed with audio only.  Persons Participating in Visit: Patient.  AWV Questionnaire: No: Patient Medicare  AWV questionnaire was not completed prior to this visit.  Cardiac Risk Factors include: advanced age (>58men, >38 women);hypertension;dyslipidemia     Objective:    Today's Vitals   12/06/23 1109  BP: 130/78  Weight: 100 lb (45.4 kg)  Height: 4' 11 (1.499 m)   Body mass index is 20.2 kg/m.     12/06/2023   11:09 AM 09/21/2022   11:35 AM 09/14/2021   10:50 AM 09/08/2020   10:14 AM 06/03/2020    2:12 PM 09/08/2019    9:40 AM 06/05/2018    1:56 PM  Advanced Directives  Does Patient Have a Medical Advance Directive? No No No No No No No   Would patient like information on creating a medical advance directive? No - Patient declined  Yes (MAU/Ambulatory/Procedural Areas - Information given) Yes (MAU/Ambulatory/Procedural Areas - Information given)  Yes (MAU/Ambulatory/Procedural Areas - Information given)      Data saved with a previous flowsheet row definition    Current Medications (verified) Outpatient Encounter Medications as of 12/06/2023  Medication Sig   acetaminophen (TYLENOL) 500 MG tablet Take 500 mg by mouth every 6 (six) hours as needed.   aspirin  EC 81 MG tablet Take 1 tablet (81 mg total) by mouth daily.   calcium  carbonate (TUMS - DOSED IN MG ELEMENTAL CALCIUM ) 500 MG chewable tablet Chew 1-3 tablets by mouth daily as needed for indigestion or heartburn.   ciclesonide (OMNARIS) 50 MCG/ACT nasal spray Place into the nose.   desonide  (DESOWEN ) 0.05 % cream APPLY TO AFFECTED AREA TWICE A DAY   doxycycline  (MONODOX ) 50 MG capsule Take 1 capsule (50 mg total) by mouth daily.   hydrocortisone cream 1 % Apply 1 application topically 2 (  two) times daily.   levothyroxine  (SYNTHROID ) 75 MCG tablet Take 1 tablet (75 mcg total) by mouth daily before breakfast. Except on Sunday take 150 mcg (2 tablets)   mometasone  (NASONEX ) 50 MCG/ACT nasal spray PLACE 2 SPRAYS INTO THE NOSE DAILY.   Multiple Vitamin (MULTIVITAMIN) capsule Take by mouth.   Probiotic Product (PROBIOTIC DAILY PO) Take  1 capsule by mouth daily as needed (when taking antibiotics).    rosuvastatin  (CRESTOR ) 10 MG tablet Take 1 tablet (10 mg total) by mouth daily.   No facility-administered encounter medications on file as of 12/06/2023.    Allergies (verified) Nickel, Codeine, Lovastatin, and Tape   History: Past Medical History:  Diagnosis Date   Allergic rhinitis    Allergy    Anemia    Arthritis    Bilateral ovarian cysts    laterality not known   Bunion    Cataract of both eyes    Complication of anesthesia    Bridge x 2 perm., crowns front    Deviated septum    Difficult intubation    Elevated BP    Environmental and seasonal allergies    Essential hypertension    Fibrocystic breast    GERD (gastroesophageal reflux disease)    Hammer toe    History of closed fracture of nasal bones    History of pneumonia    in her 40's   Hyperlipidemia    IFG (impaired fasting glucose)    Ocular rosacea    see's [facility]   OSA (obstructive sleep apnea)    Osteoarthritis cervical spine    Osteoarthritis of both hands    Osteoporosis    Osteoporosis    Rosacea    telengiectatic   Shingles    left eye   Sleep apnea    Thyroid  disease    Thyroid  nodule    left, sees Endocrinology   Vitamin D deficiency disease    Past Surgical History:  Procedure Laterality Date   BACK SURGERY     lumbar laminectomy   BIOPSY THYROID      BREAST CYST ASPIRATION Left    BREAST SURGERY     BUNIONECTOMY Right    COLONOSCOPY WITH PROPOFOL  N/A 11/27/2017   Procedure: COLONOSCOPY WITH PROPOFOL ;  Surgeon: Janalyn Keene NOVAK, MD;  Location: [facility] ENDOSCOPY;  Service: Endoscopy;  Laterality: N/A;   EYE SURGERY     SPINE SURGERY     Family History  Problem Relation Age of Onset   Asthma Mother    Hypertension Mother    Diabetes Mother    Cancer Mother        Leukemia   Peripheral vascular disease Mother    Osteoporosis Mother    Glaucoma Mother    Arthritis Mother    Varicose Veins Mother     Heart disease Father        coronary artery occlusion   Early death Father    Heart attack Father    Hypertension Sister    Diabetes Sister    Glaucoma Sister    Asthma Sister    Hearing loss Sister    Kidney disease Sister    Vision loss Sister    Varicose Veins Sister    Diabetes Maternal Grandmother    Heart disease Maternal Grandmother    Colon cancer Maternal Uncle    Lung cancer Paternal Uncle    Early death Paternal Uncle    Diabetes Maternal Grandfather    Social History   Socioeconomic History  Marital status: Divorced    Spouse name: Not on file   Number of children: 3   Years of education: some college   Highest education level: 12th grade  Occupational History   Occupation: Retired  Tobacco Use   Smoking status: Never   Smokeless tobacco: Never   Tobacco comments:    smoking cesaation materials not required  Vaping Use   Vaping status: Never Used  Substance and Sexual Activity   Alcohol use: Yes    Alcohol/week: 0.0 standard drinks of alcohol    Comment: Wine (very rarely).   Drug use: No   Sexual activity: Not Currently  Other Topics Concern   Not on file  Social History Narrative   Pt's son lives with her.    Social Drivers of Corporate investment banker Strain: Low Risk  (12/06/2023)   Overall Financial Resource Strain (CARDIA)    Difficulty of Paying Living Expenses: Not hard at all  Food Insecurity: No Food Insecurity (12/06/2023)   Hunger Vital Sign    Worried About Running Out of Food in the Last Year: Never true    Ran Out of Food in the Last Year: Never true  Transportation Needs: No Transportation Needs (12/06/2023)   PRAPARE - Administrator, Civil Service (Medical): No    Lack of Transportation (Non-Medical): No  Physical Activity: Insufficiently Active (12/06/2023)   Exercise Vital Sign    Days of Exercise per Week: 3 days    Minutes of Exercise per Session: 30 min  Stress: No Stress Concern Present (12/06/2023)    [employer] of Occupational Health - Occupational Stress Questionnaire    Feeling of Stress: Only a little  Social Connections: Socially Isolated (12/06/2023)   Social Connection and Isolation Panel    Frequency of Communication with Friends and Family: More than three times a week    Frequency of Social Gatherings with Friends and Family: Twice a week    Attends Religious Services: Never    Database administrator or Organizations: No    Attends Engineer, structural: Never    Marital Status: Divorced    Tobacco Counseling Counseling given: Not Answered Tobacco comments: smoking cesaation materials not required    Clinical Intake:  Pre-visit preparation completed: Yes  Pain : No/denies pain     BMI - recorded: 20.2 Nutritional Status: BMI of 19-24  Normal Nutritional Risks: None Diabetes: No  Lab Results  Component Value Date   HGBA1C 5.7 (H) 03/27/2023   HGBA1C 6.0 (H) 09/21/2022   HGBA1C 5.4 07/04/2020     How often do you need to have someone help you when you read instructions, pamphlets, or other written materials from your doctor or pharmacy?: 1 - Never  Interpreter Needed?: No  Information entered by :: Hope Brandenburger,CMA   Activities of Daily Living     12/06/2023   11:13 AM  In your present state of health, do you have any difficulty performing the following activities:  Hearing? 0  Vision? 0  Difficulty concentrating or making decisions? 0  Walking or climbing stairs? 0  Dressing or bathing? 0  Doing errands, shopping? 0  Preparing Food and eating ? N  Using the Toilet? N  In the past six months, have you accidently leaked urine? N  Do you have problems with loss of bowel control? N  Managing your Medications? N  Managing your Finances? N  Housekeeping or managing your Housekeeping? N    Patient Care  Team: Tapia, Leisa, PA-C as PCP - General (Family Medicine)  I have updated your Care Teams any recent Medical Services you may  have received from other providers in the past year.     Assessment:   This is a routine wellness examination for [place].  Hearing/Vision screen Hearing Screening - Comments:: No difficulties  Vision Screening - Comments:: Patient wears glasses    Goals Addressed             This Visit's Progress    Increase physical activity   On track    Recommend increasing physical activity to at least 3 days per week       Depression Screen     12/06/2023   11:14 AM 03/27/2023    8:29 AM 09/21/2022   11:31 AM 09/21/2022    8:53 AM 09/14/2021    1:52 PM 09/14/2021   10:48 AM 06/13/2021    3:42 PM  PHQ 2/9 Scores  PHQ - 2 Score 2 0 0 2 2 1  0  PHQ- 9 Score 2 0  2 3 1  0    Fall Risk     12/06/2023   11:12 AM 12/06/2023   11:11 AM 03/27/2023    8:29 AM 09/21/2022   11:27 AM 09/21/2022    8:53 AM  Fall Risk   Falls in the past year? 0 0 0 0 0  Number falls in past yr: 0 0 0 0 0  Injury with Fall? 0 0 0 0 0  Risk for fall due to : No Fall Risks No Fall Risks  No Fall Risks No Fall Risks  Follow up Falls evaluation completed Falls evaluation completed  Falls prevention discussed;Education provided Falls prevention discussed    MEDICARE RISK AT HOME:  Medicare Risk at Home Any stairs in or around the home?: Yes If so, are there any without handrails?: No Home free of loose throw rugs in walkways, pet beds, electrical cords, etc?: Yes Adequate lighting in your home to reduce risk of falls?: Yes Life alert?: No Use of a cane, walker or w/c?: No Grab bars in the bathroom?: No Shower chair or bench in shower?: No Elevated toilet seat or a handicapped toilet?: No  TIMED UP AND GO:  Was the test performed?  No  Cognitive Function: 6CIT completed    03/27/2023    8:51 AM  MMSE - Mini Mental State Exam  Orientation to time 5  Orientation to Place 5  Registration 3  Attention/ Calculation 5  Recall 2  Language- name 2 objects 2  Language- repeat 1  Language- follow 3 step command  3  Language- read & follow direction 1  Write a sentence 1  Copy design 1  Total score 29        12/06/2023   11:14 AM 09/21/2022   11:44 AM 09/08/2019    9:46 AM 07/23/2017   10:53 AM  6CIT Screen  What Year? 0 points 0 points 0 points 0 points  What month? 0 points 0 points 0 points 0 points  What time? 0 points 0 points 0 points 0 points  Count back from 20 0 points 0 points 0 points 0 points  Months in reverse 0 points 0 points 0 points 0 points  Repeat phrase 0 points 0 points 0 points 2 points  Total Score 0 points 0 points 0 points 2 points    Immunizations Immunization History  Administered Date(s) Administered   Fluad Quad(high Dose 65+) 12/26/2018, 06/13/2021  Influenza, High Dose Seasonal PF 02/21/2017, 01/10/2018   Influenza-Unspecified 01/20/2013   PFIZER(Purple Top)SARS-COV-2 Vaccination 05/13/2019, 06/03/2019, 02/24/2020   Pneumococcal Conjugate-13 11/02/2013   Pneumococcal Polysaccharide-23 03/30/2011, 11/02/2013   Tdap 01/18/2011   Zoster Recombinant(Shingrix) 06/13/2021   Zoster, Live 06/14/2013    Screening Tests Health Maintenance  Topic Date Due   MAMMOGRAM  06/27/2018   DEXA SCAN  01/31/2019   DTaP/Tdap/Td (2 - Td or Tdap) 01/17/2021   Zoster Vaccines- Shingrix (2 of 2) 08/08/2021   Colonoscopy  11/28/2022   COVID-19 Vaccine (4 - 2024-25 season) 12/16/2022   INFLUENZA VACCINE  11/15/2023   Medicare Annual Wellness (AWV)  12/05/2024   Pneumococcal Vaccine: 50+ Years  Completed   HPV VACCINES  Aged Out   Meningococcal B Vaccine  Aged Out   Hepatitis C Screening  Discontinued    Health Maintenance  Health Maintenance Due  Topic Date Due   MAMMOGRAM  06/27/2018   DEXA SCAN  01/31/2019   DTaP/Tdap/Td (2 - Td or Tdap) 01/17/2021   Zoster Vaccines- Shingrix (2 of 2) 08/08/2021   Colonoscopy  11/28/2022   COVID-19 Vaccine (4 - 2024-25 season) 12/16/2022   INFLUENZA VACCINE  11/15/2023   Health Maintenance Items Addressed:   Additional  Screening:  Vision Screening: Recommended annual ophthalmology exams for early detection of glaucoma and other disorders of the eye. Would you like a referral to an eye doctor? No    Dental Screening: Recommended annual dental exams for proper oral hygiene  Community Resource Referral / Chronic Care Management: CRR required this visit?  No   CCM required this visit?  No   Plan:    I have personally reviewed and noted the following in the patient's chart:   Medical and social history Use of alcohol, tobacco or illicit drugs  Current medications and supplements including opioid prescriptions. Patient is not currently taking opioid prescriptions. Functional ability and status Nutritional status Physical activity Advanced directives List of other physicians Hospitalizations, surgeries, and ER visits in previous 12 months Vitals Screenings to include cognitive, depression, and falls Referrals and appointments  In addition, I have reviewed and discussed with patient certain preventive protocols, quality metrics, and best practice recommendations. A written personalized care plan for preventive services as well as general preventive health recommendations were provided to patient.   Lyle MARLA Right, [place]   12/06/2023   After Visit Summary: (MyChart) Due to this being a telephonic visit, the after visit summary with patients personalized plan was offered to patient via MyChart   Notes: Nothing significant to report at this time.

## 2023-12-06 NOTE — Patient Instructions (Signed)
 Angela Kelly , Thank you for taking time out of your busy schedule to complete your Annual Wellness Visit with me. I enjoyed our conversation and look forward to speaking with you again next year. I, as well as your care team,  appreciate your ongoing commitment to your health goals. Please review the following plan we discussed and let me know if I can assist you in the future. Your Game plan/ To Do List    Referrals: If you haven't heard from the office you've been referred to, please reach out to them at the phone provided.   Follow up Visits: We will see or speak with you next year for your Next Medicare AWV with our clinical staff Have you seen your provider in the last 6 months (3 months if uncontrolled diabetes)? No  Clinician Recommendations:  Aim for 30 minutes of exercise or brisk walking, 6-8 glasses of water, and 5 servings of fruits and vegetables each day.       This is a list of the screenings recommended for you:  Health Maintenance  Topic Date Due   Mammogram  06/27/2018   DEXA scan (bone density measurement)  01/31/2019   DTaP/Tdap/Td vaccine (2 - Td or Tdap) 01/17/2021   Zoster (Shingles) Vaccine (2 of 2) 08/08/2021   Colon Cancer Screening  11/28/2022   COVID-19 Vaccine (4 - 2024-25 season) 12/16/2022   Flu Shot  11/15/2023   Medicare Annual Wellness Visit  12/05/2024   Pneumococcal Vaccine for age over 26  Completed   HPV Vaccine  Aged Out   Meningitis B Vaccine  Aged Out   Hepatitis C Screening  Discontinued    Advanced directives: (Declined) Advance directive discussed with you today. Even though you declined this today, please call our office should you change your mind, and we can give you the proper paperwork for you to fill out. Advance Care Planning is important because it:  [x]  Makes sure you receive the medical care that is consistent with your values, goals, and preferences  [x]  It provides guidance to your family and loved ones and reduces their  decisional burden about whether or not they are making the right decisions based on your wishes.  Follow the link provided in your after visit summary or read over the paperwork we have mailed to you to help you started getting your Advance Directives in place. If you need assistance in completing these, please reach out to us  so that we can help you!  See attachments for Preventive Care and Fall Prevention Tips.

## 2023-12-06 NOTE — Telephone Encounter (Signed)
 Copied from CRM #[id number]. Topic: General - Other >> Dec 06, 2023 12:36 PM Sophia H wrote: Reason for CRM: Patient had AWV scheduled and said never got a call? Called [facility] for clarification but no answer. Patient then stated she did speak with Brooke this morning, just wanted to make sure she got everything she needed from her. If all info was good then no need for call back but please reach out if anything else is needed from the patient. Ty

## 2024-03-03 NOTE — Telephone Encounter (Signed)
 open in error

## 2024-12-11 ENCOUNTER — Ambulatory Visit

## 2024-12-18 ENCOUNTER — Ambulatory Visit
